# Patient Record
Sex: Male | Born: 1960
Health system: Southern US, Community
[De-identification: ages and names within clinical notes are randomized; demographics above are authoritative.]

## PROBLEM LIST (undated history)

## (undated) DIAGNOSIS — G4730 Sleep apnea, unspecified: Secondary | ICD-10-CM

## (undated) DIAGNOSIS — F419 Anxiety disorder, unspecified: Secondary | ICD-10-CM

## (undated) DIAGNOSIS — M1990 Unspecified osteoarthritis, unspecified site: Secondary | ICD-10-CM

## (undated) DIAGNOSIS — N529 Male erectile dysfunction, unspecified: Secondary | ICD-10-CM

## (undated) DIAGNOSIS — R011 Cardiac murmur, unspecified: Secondary | ICD-10-CM

## (undated) DIAGNOSIS — I10 Essential (primary) hypertension: Secondary | ICD-10-CM

## (undated) DIAGNOSIS — E119 Type 2 diabetes mellitus without complications: Secondary | ICD-10-CM

## (undated) DIAGNOSIS — E785 Hyperlipidemia, unspecified: Secondary | ICD-10-CM

## (undated) DIAGNOSIS — F32A Depression, unspecified: Secondary | ICD-10-CM

## (undated) HISTORY — DX: Essential (primary) hypertension: I10

## (undated) HISTORY — PX: HAND SURGERY: SHX662

## (undated) HISTORY — DX: Hyperlipidemia, unspecified: E78.5

## (undated) HISTORY — DX: Anxiety disorder, unspecified: F41.9

## (undated) HISTORY — DX: Male erectile dysfunction, unspecified: N52.9

## (undated) HISTORY — DX: Depression, unspecified: F32.A

## (undated) HISTORY — PX: COLONOSCOPY: SHX5424

## (undated) HISTORY — PX: BACK SURGERY: SHX140

---

## 1998-11-26 ENCOUNTER — Ambulatory Visit (HOSPITAL_COMMUNITY): Admission: RE | Admit: 1998-11-26 | Discharge: 1998-11-26 | Payer: Self-pay | Admitting: Specialist

## 1998-11-26 ENCOUNTER — Encounter: Payer: Self-pay | Admitting: Specialist

## 1998-12-10 ENCOUNTER — Ambulatory Visit (HOSPITAL_COMMUNITY): Admission: RE | Admit: 1998-12-10 | Discharge: 1998-12-10 | Payer: Self-pay | Admitting: Specialist

## 1998-12-10 ENCOUNTER — Encounter: Payer: Self-pay | Admitting: Specialist

## 1999-01-27 ENCOUNTER — Encounter: Payer: Self-pay | Admitting: Specialist

## 1999-02-02 ENCOUNTER — Inpatient Hospital Stay (HOSPITAL_COMMUNITY): Admission: RE | Admit: 1999-02-02 | Discharge: 1999-02-04 | Payer: Self-pay | Admitting: Specialist

## 1999-02-02 ENCOUNTER — Encounter: Payer: Self-pay | Admitting: Specialist

## 1999-02-02 ENCOUNTER — Encounter (INDEPENDENT_AMBULATORY_CARE_PROVIDER_SITE_OTHER): Payer: Self-pay

## 1999-05-04 ENCOUNTER — Ambulatory Visit (HOSPITAL_COMMUNITY): Admission: RE | Admit: 1999-05-04 | Discharge: 1999-05-04 | Payer: Self-pay | Admitting: Specialist

## 1999-05-04 ENCOUNTER — Encounter: Payer: Self-pay | Admitting: Specialist

## 1999-05-19 ENCOUNTER — Ambulatory Visit (HOSPITAL_COMMUNITY): Admission: RE | Admit: 1999-05-19 | Discharge: 1999-05-19 | Payer: Self-pay | Admitting: Specialist

## 1999-05-19 ENCOUNTER — Encounter: Payer: Self-pay | Admitting: Specialist

## 1999-06-04 ENCOUNTER — Encounter: Payer: Self-pay | Admitting: Specialist

## 1999-06-04 ENCOUNTER — Ambulatory Visit (HOSPITAL_COMMUNITY): Admission: RE | Admit: 1999-06-04 | Discharge: 1999-06-04 | Payer: Self-pay | Admitting: Specialist

## 1999-07-13 ENCOUNTER — Encounter: Payer: Self-pay | Admitting: Specialist

## 1999-07-13 ENCOUNTER — Ambulatory Visit (HOSPITAL_COMMUNITY): Admission: RE | Admit: 1999-07-13 | Discharge: 1999-07-13 | Payer: Self-pay | Admitting: Specialist

## 1999-08-12 ENCOUNTER — Observation Stay (HOSPITAL_COMMUNITY): Admission: RE | Admit: 1999-08-12 | Discharge: 1999-08-13 | Payer: Self-pay | Admitting: Specialist

## 1999-08-12 ENCOUNTER — Encounter: Payer: Self-pay | Admitting: Specialist

## 1999-12-28 ENCOUNTER — Encounter: Payer: Self-pay | Admitting: Specialist

## 1999-12-28 ENCOUNTER — Ambulatory Visit (HOSPITAL_COMMUNITY): Admission: RE | Admit: 1999-12-28 | Discharge: 1999-12-28 | Payer: Self-pay | Admitting: Specialist

## 2000-02-22 ENCOUNTER — Encounter: Payer: Self-pay | Admitting: Specialist

## 2000-02-22 ENCOUNTER — Ambulatory Visit (HOSPITAL_COMMUNITY): Admission: RE | Admit: 2000-02-22 | Discharge: 2000-02-22 | Payer: Self-pay | Admitting: Specialist

## 2000-07-31 ENCOUNTER — Encounter: Payer: Self-pay | Admitting: Specialist

## 2000-08-07 ENCOUNTER — Encounter (INDEPENDENT_AMBULATORY_CARE_PROVIDER_SITE_OTHER): Payer: Self-pay

## 2000-08-07 ENCOUNTER — Inpatient Hospital Stay (HOSPITAL_COMMUNITY): Admission: RE | Admit: 2000-08-07 | Discharge: 2000-08-10 | Payer: Self-pay | Admitting: Specialist

## 2000-08-07 ENCOUNTER — Encounter: Payer: Self-pay | Admitting: Specialist

## 2001-02-28 ENCOUNTER — Encounter: Payer: Self-pay | Admitting: Specialist

## 2001-02-28 ENCOUNTER — Encounter: Admission: RE | Admit: 2001-02-28 | Discharge: 2001-02-28 | Payer: Self-pay | Admitting: Specialist

## 2001-04-11 DIAGNOSIS — I219 Acute myocardial infarction, unspecified: Secondary | ICD-10-CM

## 2001-04-11 HISTORY — DX: Acute myocardial infarction, unspecified: I21.9

## 2001-07-10 HISTORY — PX: LAMINECTOMY: SHX219

## 2002-10-01 ENCOUNTER — Encounter: Payer: Self-pay | Admitting: Specialist

## 2002-10-01 ENCOUNTER — Encounter: Admission: RE | Admit: 2002-10-01 | Discharge: 2002-10-01 | Payer: Self-pay | Admitting: Specialist

## 2002-10-01 ENCOUNTER — Encounter: Payer: Self-pay | Admitting: Radiology

## 2002-10-23 ENCOUNTER — Encounter: Payer: Self-pay | Admitting: Specialist

## 2002-10-23 ENCOUNTER — Encounter: Admission: RE | Admit: 2002-10-23 | Discharge: 2002-10-23 | Payer: Self-pay | Admitting: Specialist

## 2003-01-06 ENCOUNTER — Encounter: Payer: Self-pay | Admitting: Specialist

## 2003-01-06 ENCOUNTER — Encounter: Admission: RE | Admit: 2003-01-06 | Discharge: 2003-01-06 | Payer: Self-pay | Admitting: Specialist

## 2003-02-12 ENCOUNTER — Encounter: Admission: RE | Admit: 2003-02-12 | Discharge: 2003-02-12 | Payer: Self-pay | Admitting: Specialist

## 2003-02-19 ENCOUNTER — Emergency Department (HOSPITAL_COMMUNITY): Admission: EM | Admit: 2003-02-19 | Discharge: 2003-02-19 | Payer: Self-pay

## 2003-02-27 ENCOUNTER — Encounter: Admission: RE | Admit: 2003-02-27 | Discharge: 2003-02-27 | Payer: Self-pay | Admitting: Specialist

## 2003-05-07 ENCOUNTER — Encounter: Admission: RE | Admit: 2003-05-07 | Discharge: 2003-05-07 | Payer: Self-pay | Admitting: Specialist

## 2003-05-21 ENCOUNTER — Encounter: Admission: RE | Admit: 2003-05-21 | Discharge: 2003-05-21 | Payer: Self-pay | Admitting: Specialist

## 2003-06-11 ENCOUNTER — Encounter: Admission: RE | Admit: 2003-06-11 | Discharge: 2003-06-11 | Payer: Self-pay | Admitting: Specialist

## 2005-01-05 IMAGING — CT CT ABDOMEN W/ CM
1 of 4 series · 14 of 32 positions shown, 19 images · non-contrast
Comparison: none

CLINICAL DATA: Abdominal pain.

[Series 2: abd/pelvis 5.0 b10f · axial · 0.75mm/px · z∈[+1196,+1606]mm · 14 of 94 slices shown, 19 images]
[im 6/94  soft-tissue]
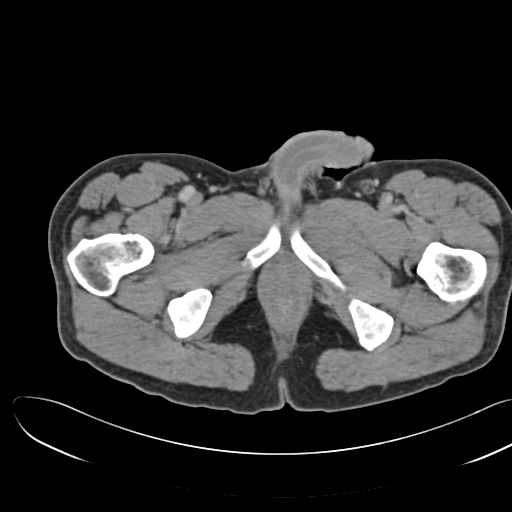
[im 6/94  bone]
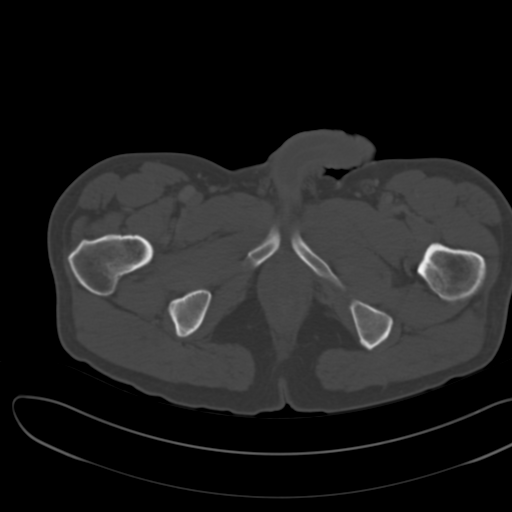
[im 11/94  soft-tissue]
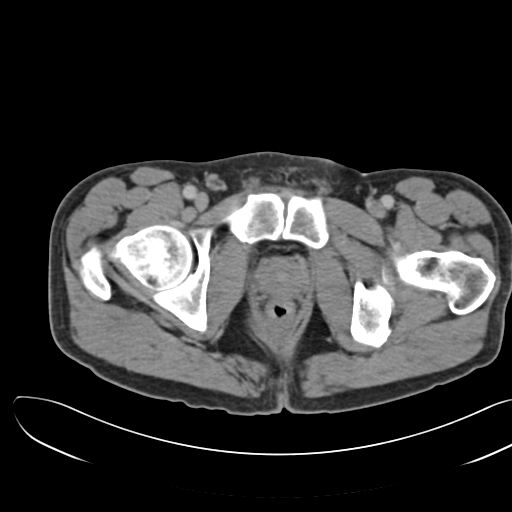
[im 21/94  soft-tissue]
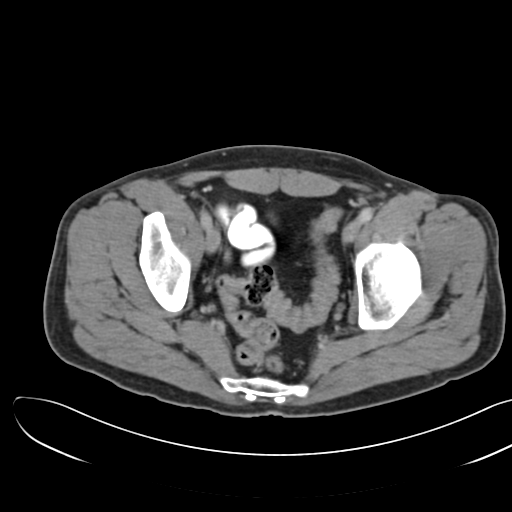
[im 26/94  soft-tissue]
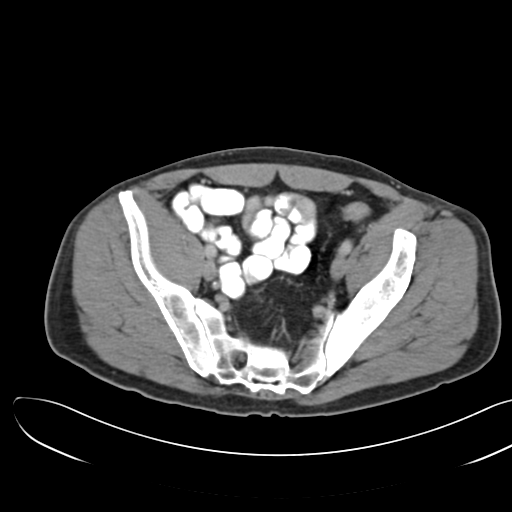
[im 32/94  soft-tissue]
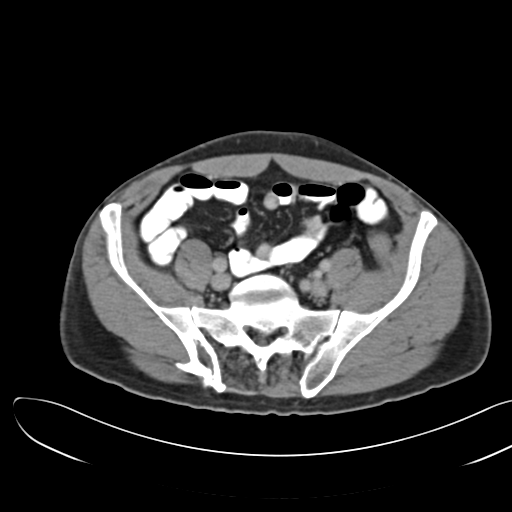
[im 42/94  soft-tissue]
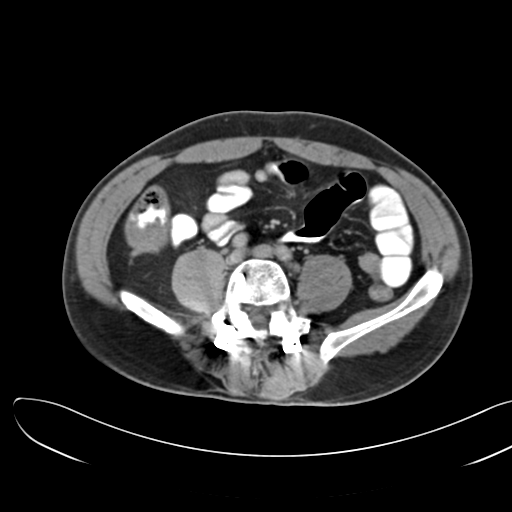
[im 47/94  soft-tissue]
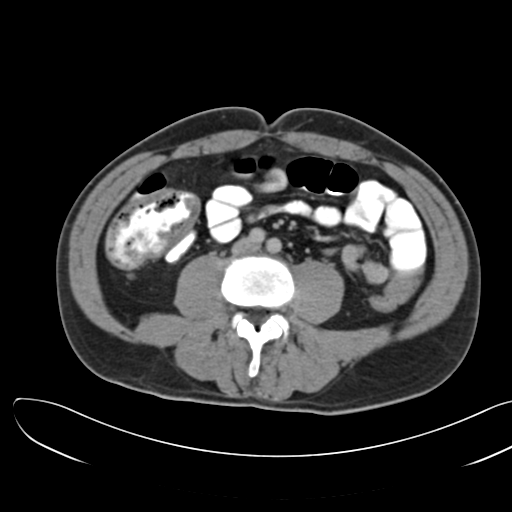
[im 52/94  soft-tissue]
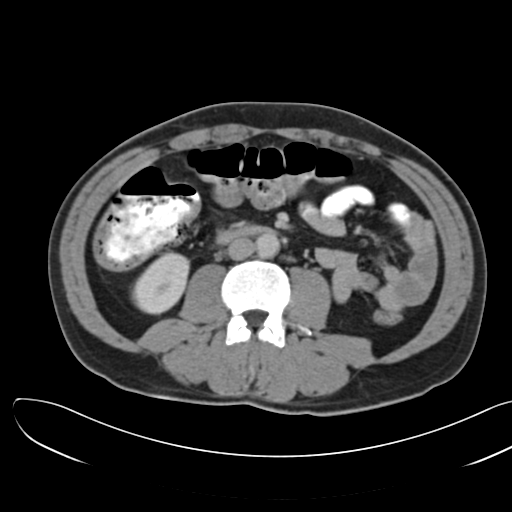
[im 63/94  soft-tissue]
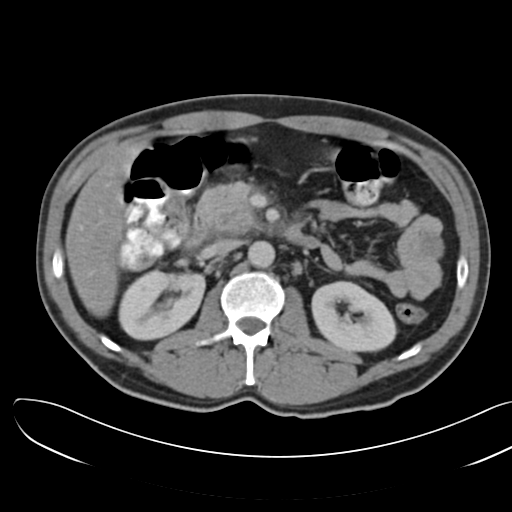
[im 63/94  bone]
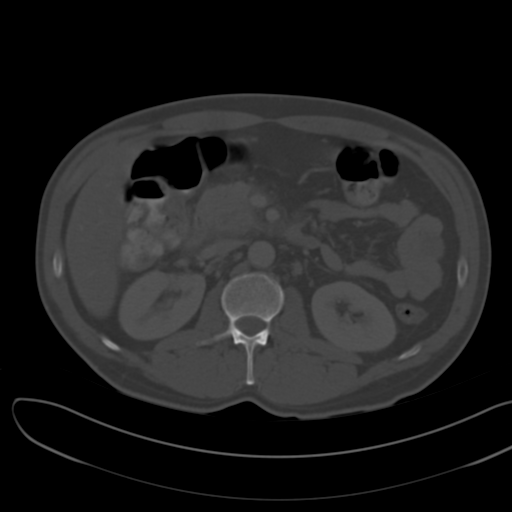
[im 68/94  soft-tissue]
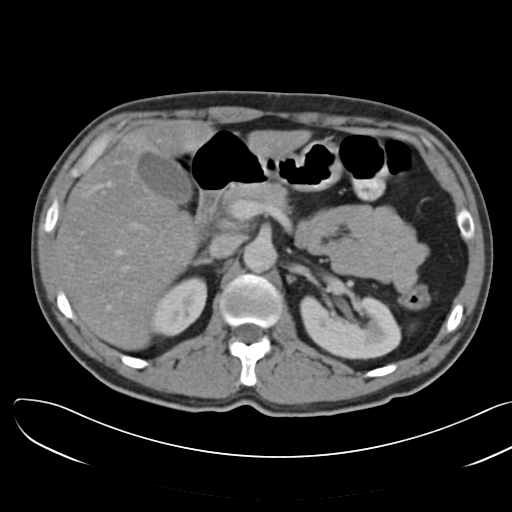
[im 73/94  soft-tissue]
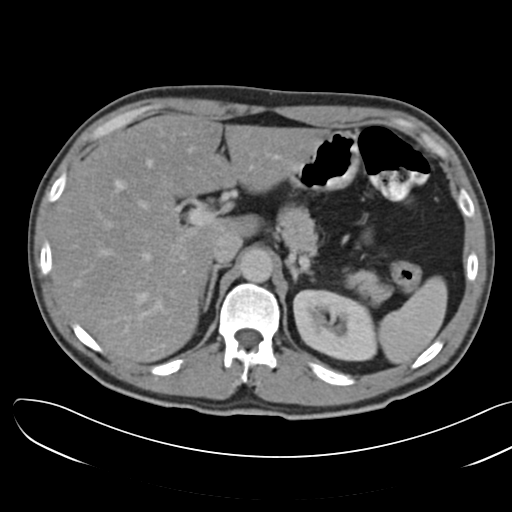
[im 73/94  lung]
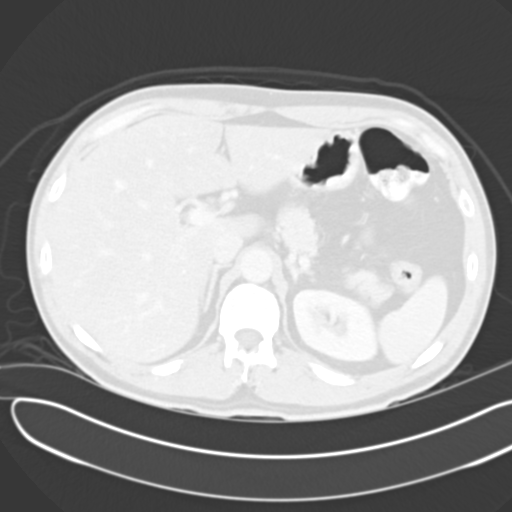
[im 78/94  lung]
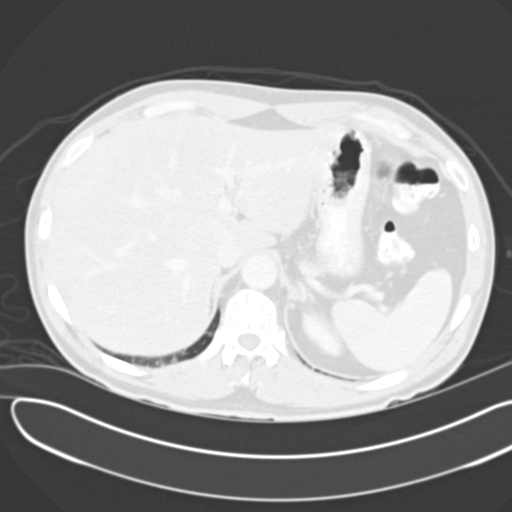
[im 83/94  soft-tissue]
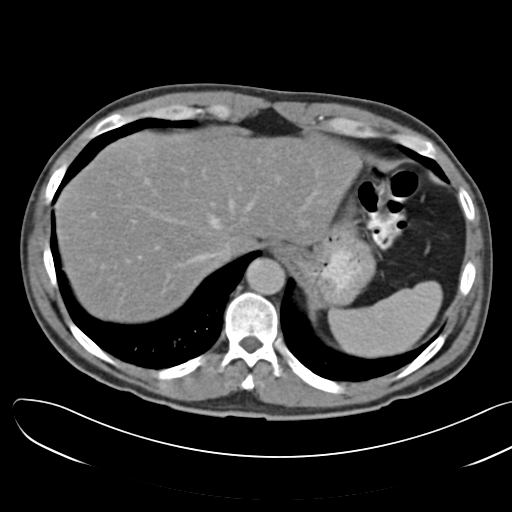
[im 83/94  lung]
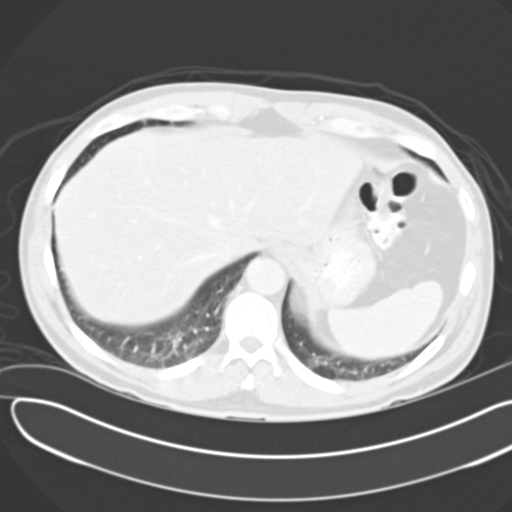
[im 88/94  soft-tissue]
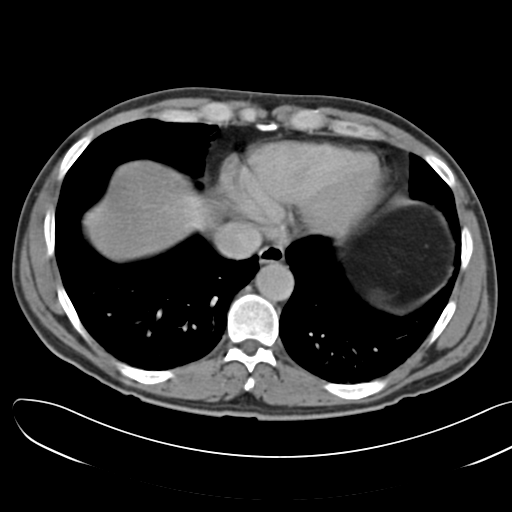
[im 88/94  lung]
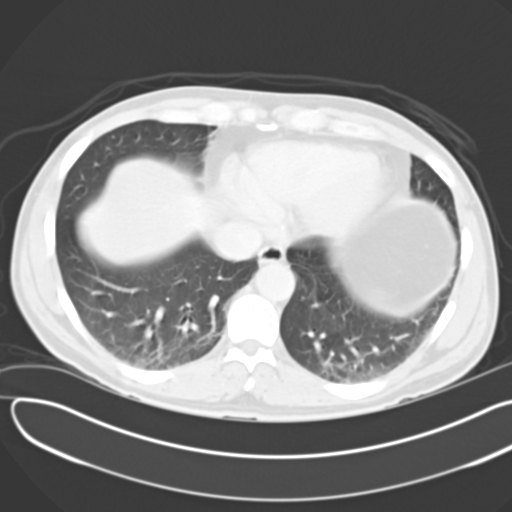

[14 of 32 positions shown; findings below may reference images not displayed]

CT SCAN OF THE ABDOMEN WITH CONTRAST
 Multiple spiral images were made through abdomen after intravenous injection of 100 cc of Omnipaque 300.  Oral contrast was also used. 

 The lung bases are normal.  There is fatty change in the liver.  There is enlargement of the pancreatic head with peripancreatic inflammatory change.  The body and tail of the pancreas are not enlarged or inflamed.  There are no calcified gallstones.  The kidneys are normal.

 IMPRESSION
 1.  Enlargement of the pancreatic head with inflammatory change compatible with early pancreatitis with no pseudocyst.  
 2.  Fatty change in the liver.

 CT SCAN OF THE PELVIS WITH CONTRAST
 Additional images through the pelvis after oral and IV contrast demonstrate no mass, adenopathy, or free fluid.

 IMPRESSION
 Negative CT scan of the pelvis with contrast.

## 2005-01-26 ENCOUNTER — Emergency Department (HOSPITAL_COMMUNITY): Admission: EM | Admit: 2005-01-26 | Discharge: 2005-01-27 | Payer: Self-pay | Admitting: Emergency Medicine

## 2005-02-08 ENCOUNTER — Ambulatory Visit: Payer: Self-pay | Admitting: Gastroenterology

## 2005-02-12 ENCOUNTER — Ambulatory Visit (HOSPITAL_COMMUNITY): Admission: RE | Admit: 2005-02-12 | Discharge: 2005-02-12 | Payer: Self-pay | Admitting: Gastroenterology

## 2006-12-12 ENCOUNTER — Ambulatory Visit: Payer: Self-pay | Admitting: Internal Medicine

## 2006-12-14 ENCOUNTER — Ambulatory Visit (HOSPITAL_COMMUNITY): Admission: RE | Admit: 2006-12-14 | Discharge: 2006-12-14 | Payer: Self-pay | Admitting: Internal Medicine

## 2007-01-08 ENCOUNTER — Ambulatory Visit: Payer: Self-pay | Admitting: Internal Medicine

## 2007-01-15 ENCOUNTER — Ambulatory Visit: Payer: Self-pay | Admitting: Internal Medicine

## 2007-01-31 DIAGNOSIS — J984 Other disorders of lung: Secondary | ICD-10-CM | POA: Insufficient documentation

## 2007-01-31 DIAGNOSIS — R059 Cough, unspecified: Secondary | ICD-10-CM | POA: Insufficient documentation

## 2007-01-31 DIAGNOSIS — R05 Cough: Secondary | ICD-10-CM

## 2007-01-31 HISTORY — DX: Other disorders of lung: J98.4

## 2008-09-02 ENCOUNTER — Ambulatory Visit: Payer: Self-pay | Admitting: Internal Medicine

## 2008-09-02 DIAGNOSIS — R0602 Shortness of breath: Secondary | ICD-10-CM | POA: Insufficient documentation

## 2008-09-02 DIAGNOSIS — F17200 Nicotine dependence, unspecified, uncomplicated: Secondary | ICD-10-CM

## 2008-09-03 ENCOUNTER — Ambulatory Visit: Payer: Self-pay | Admitting: Internal Medicine

## 2008-10-21 ENCOUNTER — Ambulatory Visit: Payer: Self-pay | Admitting: Internal Medicine

## 2008-10-21 ENCOUNTER — Encounter: Payer: Self-pay | Admitting: Internal Medicine

## 2008-10-21 DIAGNOSIS — R079 Chest pain, unspecified: Secondary | ICD-10-CM | POA: Insufficient documentation

## 2008-10-31 ENCOUNTER — Encounter: Payer: Self-pay | Admitting: Internal Medicine

## 2008-10-31 ENCOUNTER — Ambulatory Visit (HOSPITAL_COMMUNITY): Admission: RE | Admit: 2008-10-31 | Discharge: 2008-10-31 | Payer: Self-pay | Admitting: Internal Medicine

## 2008-11-12 ENCOUNTER — Ambulatory Visit: Payer: Self-pay | Admitting: Internal Medicine

## 2008-11-13 ENCOUNTER — Telehealth: Payer: Self-pay | Admitting: Internal Medicine

## 2008-11-21 ENCOUNTER — Ambulatory Visit: Payer: Self-pay | Admitting: Internal Medicine

## 2008-11-21 DIAGNOSIS — J449 Chronic obstructive pulmonary disease, unspecified: Secondary | ICD-10-CM | POA: Insufficient documentation

## 2010-05-01 ENCOUNTER — Encounter: Payer: Self-pay | Admitting: Specialist

## 2010-08-24 NOTE — Letter (Signed)
January 15, 2007    Dr. Selinda [name]  [address] [place], [place]  [place], [place]  [address]   RE:  Miguel, [name]  MRN:  [id number]  /  DOB:  07-19-1960   Dear Dr. Dimas [name]:   I saw [name] again in pulmonary clinic followup for right  atypical chest pain.  He met with an orthopedic physician a month ago  and got epidural injection of possibly steroids, but he says after this  his chest pain is completely resolved.  He has really no new complaints.   PAST MEDICAL HISTORY:  No change from December 12, 2006.  He still  continues to smoke.   ALLERGIES:  No known drug allergies.   MEDICATIONS:  No changes to medications since December 12, 2006.   SOCIAL HISTORY:  No change since December 12, 2006.  He is actually  accompanied by his wife and child today.   FAMILY HISTORY:  No change since December 12, 2006.   REVIEW OF SYSTEMS:  No change since December 12, 2006.   PHYSICAL EXAMINATION:  No change since December 12, 2006.  VITAL SIGNS:  Weight 192.8, temperature 98.9, blood pressure 110/80,  pulse 65, saturation 98% on room air.  GENERAL:  A pleasant male seated comfortably.  CENTRAL NERVOUS SYSTEM:  Alert and oriented x3.  Moves all four  extremities.  Speech and ambulation are normal.  CARDIOVASCULAR:  Normal heart sounds, regular rate and rhythm.  RESPIRATORY:  Air entry is normal on both sides.  CHEST:  No focal tenderness.  HEENT:  Mallampati class I.  NECK:  No neck nodes, elevated JVP.  ABDOMEN:  Soft, obese, nontender, normal bowel sounds.  EXTREMITIES:  No pedal edema, no clubbing, no cyanosis.  SKIN:  Intact.  MUSCULOSKELETAL:  No back or spine deformities.   LABORATORY DATA:  A full set of PFTs today essentially normal.  DLCO  uncorrected for hemoglobin was 22.2 mL/mmHg per minute 79% predicted,  but it is borderline.  V/Q scan:  This was reported as low probability  or normal.   ASSESSMENT/PLAN:  1. Right lateral chest pain.  This is resolved with epidural  injection      of what sounds like corticosteroids.  Therefore this pain was most      likely related to degenerative joint disease.  This is no longer an      issue and will not follow this up at any time.   1. Cough.  He still has occasional mild cough and he smokes, but his      PFTs are normal.  We do not have an etiology for cough other than      smoking so he probably has mild chronic bronchitis that has not      shown up in the PFTs.  His DLCO was 79%.  This is borderline low so      it could well be he has isolated low DLCO with the coughs being      chronic obstructive pulmonary disease.  On the other hand this      could just be a laboratory variation.  This is normal.  This can be      followed up expectantly.   1. Nodules and atelectasis.  His next CT scan will be done in June      2009.   1. Smoking.  Last visit he wanted to quit.  This visit I approached      the subject with him,  but at first he said he wanted to quit but on      his own.  At that time his wife said that he had no willpower.  He      then admitted that he basically has a lot of free time and      therefore it is difficult for him to quit. Later towards the end of      the evaluation he said that he wanted Wellbutrin to quit.      Therefore I wrote a prescription for Wellbutrin.  I cautioned him      about the side effects.  Told him to start working his cigarettes      down over two weeks to a quit date, and during this time Wellbutrin      needs to be on board for at least two weeks.  Total duration of      therapy would be three months.  He will see me at four weeks to      report      progress and also to have more discussions about quitting smoking.      Currently there are, otherwise, no issues.   Return to clinic in four weeks for quit smoking progress.    Sincerely,      Miguel Shan, MD  Electronically Signed    MR/MedQ  DD: 01/15/2007  DT: 01/16/2007  Job #: [id number]

## 2010-08-24 NOTE — Letter (Signed)
December 12, 2006    Miguel [name]  [address] [place], [place]  [place] [address]   RE:  Miguel [name]  MRN:  [id number]  /  DOB:  Jul 26, 1960   Dear Dr. Dimas Aguas,   It was a pleasure to see [name] in pulmonary clinic today.  Thank you for the interesting referral.  As you know, he is a pleasant  50 year old gentleman with pain in the right side of the chest for the  last 3-4 months.  He said he was diagnosed with pneumonia in June 2008  and was given antibiotics. However, on followup with another physician  he says his diagnosis was changed to pleurisy. He then underwent a CT  scan of the chest on October 10, 2006 (see results below).  A month later  which was somewhere in August 2008, he was seen by a different doctor  who said that he had no pleurisy.  He is worried about changing  diagnosis.   He says his pain is constant and rates it as a 4-5/10. Localized to  right infra axillary region laterally.  He says that it is worse when he  lies down on his left side but is better when he lies on his right side.  It also gets worse with sneezing, but otherwise there are no  characteristics for the pain.  He denies any shingles, and there is no  relation to coughing, movement, or any other features.  He denies any  fevers, shortness of breath, hemoptysis, pedal edema, trauma, deep  venous thrombosis, pulmonary embolism, or loss of consciousness.   PAST MEDICAL HISTORY:  1. Recurrent annual admissions for epigastric/chest pain in local      emergency rooms for the past 5-6 years.  He says that he has      occasionally even been admitted to the cardiac telemetry unit and      is ruled out for myocardial infarction.  However, he has never had      a cardiac catheterization or a stress test.   1. Chronic L4-L5 back pain.  He always has permanent sciatica.  He has      surgical history to his back.  He had his first diskectomy in 1988.      This was followed by a ruptured disk  again and had neck surgery in      2000 and a third surgery again in 2005 when he said a cage was      placed.  Since then, he has been disabled and always lives with      sciatica.   1. Smoking.  He is a chronic heavy smoker.  He smoked 2 packs a day      for the last 35 years.  He has some amount of smoker's cough.  He      has tried Chantix and Wellbutrin in the past.  He agrees that      smoking is bad for his health and wants to quit, but he said he is      weak.   ALLERGIES:  No known drug allergies.   CURRENT MEDICATIONS:  1. Neurontin 600 mg t.i.d.  2. Zocor 40 mg once daily.  3. Omega 3 fatty acid once daily.  4. Zoloft 50 mg once daily.  5. Percocet and oxycodone since 2004 for back.  6. He also gets epidural injections periodically for his back.  7. He takes Viagra p.r.n.   SOCIAL HISTORY:  He is currently disabled.  He used to work in  [employer].  He smokes 2 packs a day x35 years.  He still smokes.  He  lives with his wife and has not traveled anywhere recently.  He also has  children whom he lives with.   FAMILY HISTORY:  He thinks his dad might have COPD, and his brother had  some kind of a stomach cancer at birth, but he is still alive at age 64.   REVIEW OF SYSTEMS:  1. Positive for smoker's cough.  He states this is not bad.  He has      had it for 4-5 years.  It is a dry cough and is just like he clears      his throat.  2. Chronic back pain with sciatica.  The rest of the review of systems      was negative.   PHYSICAL EXAM:  VITAL SIGNS:  Weight 194.2, temperature 98.6, blood  pressure 118/82, pulse of 57, oxygen saturation 98% on room air.  GENERAL EXAM:  A pleasant male seated comfortably in the exam room.  CENTRAL NERVOUS SYSTEM:  Alert and oriented x3.  Moves all 4 extremities  normally.  Speech is normal.  Ambulation normal.  CARDIOVASCULAR:  Normal heart sounds.  Regular rate and rhythm.  No  gallop, no murmurs.  RESPIRATORY:  Air entry equal  on both sides, normal breath sounds.  CHEST EXAM:  No focal tenderness including at the site of pain.  HEENT:  Mallampati class 1.  No neck nodes, no elevated JVP.  ABDOMEN:  Soft, obese, nontender, no organomegaly, normal bowel sounds.  EXTREMITIES:  No pedal edema, no clubbing, no cyanosis.  SKIN:  Intact.  MUSCULOSKELETAL:  No back or spine deformities.   LABORATORY VALUES:  A) I have reviewed the CT scan from October 10, 2006.  This was done at [facility] the [facility], and the report is as follows.  1. There is a 1 mm nodule abutting the pleura in the posterior segment      of the left upper lobe on slice 27.  I personally saw the scan.  I      could not confirm the presence of this nodule.  2. There is a small area of consolidation immediately adjacent to the      epicardial fat on the right in the medial segment of the right      middle lobe.  I confirmed this finding.  Inferiorly a second      smaller area of infiltrate is noted in the medial segment of the      right middle lobe.  I confirmed this finding as well.  3. There is a minimal atelectasis in the anterior left base.  I      confirmed this finding as well.  4. On image study 6, the left upper lobe anteriorly between the heart      and the chest wall there is some amount of scarring or atelectasis.      This is not on their individual report.  There are several      paratracheal lymph nodes.  The largest of these is 1.3 cm.  No      other lymph node prominence is appreciated.    B)  MRA of the thoracic spine and  results of MRI of thoracic spine,  December 04, 2006.  I do not have this film, but it is reported it  is a  mild degenerative change T9 to T10 without any other abnormalities.   ASSESSMENT/PLAN:  In summary, Miguel [name] is a 50 year old male who is a  chronic heavy smoker, has chronic cough, has 3 month history of constant  atypical chest pain of unclear etiology.  CT scan shows  some right  middle lobe atelectasis along the epicardial fat, but his pain is more  lateral in the inframaxillary region.  There are no CT abnormalities in  this region.   1. Chest pain.  I do not know the etiology.  One very possibility is      pulmonary embolism.  In order to be complete, I will get a VQ scan;      however, I doubt this is the problem.   1. Smoking.  He wants to quit.  He has agreed to quit.  He will try      Wellbutrin again.  He prefers this over Chantix.  We will discuss      more of this at the next visit.   1. Cough.  This sounds more like he has chronic bronchitis and chronic      obstructive pulmonary disease.  We will repeat full set of PFTs,      and I will evaluate for chronic obstructive pulmonary disease at      next visit.   1. Nodules and atelectasis.  Currently these are micro-nodules, and      the one reported nodule in the left upper lobe is only 1 mm.  I      never saw this myself.  Based on Fleischner criteria, because all      these nodules are less than 4-5 mm, a repeat scan is required only      in 1 year;      however, given the level of concern in this gentleman, I will get      another scan in 9 months from the original scan.  I might also      consider a PET scan for greater than 1 cm paratracheal node if the      CT scan is negative.  I will also consider a PET scan and a bone      scan if the VQ scans are negative.    Sincerely,      Kalman Shan, MD  Electronically Signed    MR/MedQ  DD: 12/12/2006  DT: 12/12/2006  Job #: [id number]

## 2010-08-27 NOTE — Op Note (Signed)
[facility]  Patient:    Miguel [name], Miguel [name]                     MRN: [id number] Proc. Date: 08/07/00 Adm. Date:  [id number] Attending:  Pierce Crane                           Operative Report  PREOPERATIVE DIAGNOSES:  Recurrent herniated nucleus pulposus L4-5, degenerative disk disease L4-5.  POSTOPERATIVE DIAGNOSES:  Recurrent herniated nucleus pulposus L4-5, degenerative disk disease L4-5.  OPERATION PERFORMED:  Medial microdiskectomy L4-5 posterior lumbar interbody fusion with radiolucent cage devices utilizing right iliac crest bone graft autogenous, posterior lateral lumbar fusion L4-5 utilizing pedicle screw instrumentation and rods, a Depuy system.  ANESTHESIA:  General.  BRIEF HISTORY AND INDICATIONS:  A 50 year old with multiple recurrent disk herniations. Operation intervention was indicated for diskectomy and fusion to prevent recurrence. The patient had a diskography at 3-4 and at 5-1 that was discordant. The risks and benefits were discussed including bleeding, infection, injury to neurovascular structures, pseudoarthrosis, need for fusion at adjacent levels, etc.  TECHNIQUE:  The patient in the supine position after an adequate level of general anesthesia and 1 gm of Kefzol, the patient placed prone on the Andrews frame and all bony prominences were well padded. The lumbar region was prepped and draped in the usual sterile fashion. A preexisting surgical scar was excised and dissection was extended cephalad and caudad. The subcutaneous tissue was dissected, electrocautery utilized to achieve hemostasis. The dorsolumbar fascia was identified and divided in line with the skin incision from the L3 spinous process to S1. The paraspinous muscle was elevated from the lamina of 3-4, 5 and S1 bilaterally taking care not to enter the interlaminar space on the left since he has had a previous laminectomy on the left at L4. Next a  McCullough retractor was placed. The 4-5 space was confirmed with x-ray. A curette was utilized to attach epidural fibrosis from the lamina of 4, spinous processes of 4 and 5 were partially removed. A 2 mm Kerrison was used to enlarge the laminotomy cephalad and caudad. On the right where the disk herniation was noted, a DErrico was utilized to gently mobilized the thecal sac medially. The nerve root of 5 and 4 were identified and well protected. A large HNP paracentral to the right was noted and extruded. Annulotomy was performed and a copious portion of disk material was removed from the disk space. Following this thorough diskectomy, there was no residual compression found in the thecal sac and the nerve root. Next the Monark system radiolucent carbon fiber interbody cages were then inserted after the appropriate utilization of the surgeons instrumentation involving spreader paddles, endplates curettes, cylindrical reamers followed by boxed broaches. The optimal sizing was to a 9 x 11. The spreader was then inserted onto the right and then the disk space was entered on the left at the appropriate mobilization of the neural elements, annulotomy and diskectomy. In a similar fashion, the left interspace was prepared after the appropriate endplate curetting broaching to prepare the disk space for insertion of the radiolucent cages. After preparation, the iliac crest was harvested from the right iliac crest. A separate incision made through the dorsolumbar fascia. The posterior cortex was removed with a curette between the tables. The iliac creatinine bone graft was harvested, excellent cancellous harvest was obtained between the tables of the crest. The wound then copiously irrigated,  bone wax placed in the wound between the tables followed by thrombin soaked Gelfoam and the fascia reapproximated with #1 Vicryl interrupted figure-of-eight sutures. It was dry, it was dried prior to its  closure. Cancellous bone graft was morcellized, impacted into the cages with neural elements well protected under C-arm x-ray. The cages were then inserted into the disk space with excellent purchase noted. The depth of insertion was measured and a 25 mm depth cage was utilized. This was at all times examined under x-ray with radiolucent markers on the cages for visualization. Following insertion of the interbody cages, the pedicle screws were then inserted with the appropriate identification at the insertion site. The intersections between the lateral aspect of the superior portion of the facet and the transverse process first by an all and then by a pedicle finder. This was again done by x-ray with appropriate convergence at all times in x-ray. This was visualized. Each was measured to a 40 tapped and insertion of the pedicle screw 625 was performed at each pedicle both at 4 and 5 with excellent purchase noted. Prior to actual insertion of the pedicle screws, the high-speed bur was utilized to decorticate the transverse processes, the pars, the lateral aspect of the facet. The remaining facet was then debrided and bone graft placed within the facet as well. Bone graft was placed in the lateral recess. Next the pedicle screws were then inserted. A precontoured rod was then placed between the pedicle screws. The dovetail cap was then in place with a derotation device. This was performed with the four pedicle screws. Facet screws were then tightened and torqued in the L5 assembly. The compressor was utilized to compress the rod and then the facet screws were tightened to 80 pounds torque on the L4 assembly. Excellent purchase was obtained, construct was checked and manipulated without evidence of disassociation. Bone graft was placed at both sides after decortication and prior to the placement of the pedicle screws and of the rods. This was done under x-ray and on the AP and lateral planes  was found to be satisfactory. The wound was copiously irrigated, electrocautery was utilized to achieve hemostasis. The canal was checked and the hockey-stick probe placed at the foramen of 4 and 5 bilaterally and found to be widely patent. The wound was  copiously irrigated. Thrombin-soaked Gelfoam was placed in the laminotomy defects. Both cages were found to be countersunk. The dura was found to be pulsatile and without evidence of leakage. Next a Hemovac was placed and brought out through a stab wound in the dorsolumbar fascia. The paraspinous musculature was reapproximated with #0 Vicryl simple sutures. The dorsolumbar fascia was reapproximated with #1 Vicryl interrupted figure-of-eight sutures. Excellent water-tight closure was noted. The subcutaneous tissue was reapproximated with 2-0 Vicryl simple sutures. The skin was reapproximated with staples. The Hemovac was charged. The dressing was secured with tape. The patient was placed supine on the hospital bed, extubated without difficulty and transported to the recovery room in satisfactory condition.  The patient tolerated the procedure well with no complications. Blood loss was 600 cc. He got 250 cc back in [facility] [employer]. DD:  08/07/00 TD:  08/08/00 Job: [id number]

## 2010-08-27 NOTE — Discharge Summary (Signed)
[facility]  Patient:    Miguel [name], Miguel [name]                       MRN: [id number] Adm. Date:  08/07/00 Disc. Date: 08/10/00 Attending:  Javier Docker, M.D. Dictator:   Colleen P. Mahar, P.A. CC:         Dr. Carlynn Spry, [place]   Discharge Summary  ADMISSION DIAGNOSES: 1. Recurrent herniated nucleus pulposus L4-L5. 2. Gastroesophageal reflux disease.  DISCHARGE DIAGNOSES: 1. Redo L4-L5 diskectomy, iliac crest bone graft right side, posterior    lateral interbody fusion with iliac crest bone graft and pedicle screws. 2. Postoperative hemorrhagic anemia, mild, asymptomatic, not requiring    transfusion. 3. Gastroesophageal reflux disease.  PROCEDURES:  Redo diskectomy L4-L5 with posterior lateral interbody fusion using an iliac crest bone graft on the right side and pedicle screws.  This was done by Dr. Jene Every with the assistance of Dr. Otelia Sergeant under general anesthesia.  CONSULTS:  None.  BRIEF ADMISSION HISTORY:  This is a 50 year old white male who has been seen by our practice for continuing problems concerning his low back with radiation into the right lower extremity.  The patient has had several back surgeries, one in the distant past and then one more recently in 2000.  He had a bilateral hemilaminectomy at L3-L4 and L4-L5 with a redo diskectomy at L4-L5 on the right.  Unfortunately, the patient has continued with pain and discomfort at L4-L5 nerve root distribution, and an MRI showed a large recurrent disk herniation at L4-L5.  This was an increase from the MRI of July 2001.  After much discussion and the fact that this patient has had several level surgeries as well as the fact that the patient has significant interference with is day-to-day activities, it is felt that he would benefit from surgical intervention and is being admitted for the above-stated procedure.  LABORATORY AND X-RAY DATA:  His preadmission labs from July 31, 2000:   H&H is 14.6 and 43.4.  On August 08, 2000, these decreased to 12.0 and 35.3 for H&H. His WBC on April 30 was 14.8 and RBC 3.96.  On Aug 09, 2000, WBC was 14.8, RBC 4.06, hemoglobin and hematocrit 12.3 and 35.9.  On Aug 10, 2000, H&H was 12.3 and 36.1 respectively.  Routine chemistries preadmission were within normal limits with the exception of glucose of 126.  On August 08, 2000, all was normal.  Urinalysis:  Preadmission urinalysis was negative.  EKG done on July 31, 2000, revealed sinus bradycardia, left atrial enlargement, cannot rule out anterior infarct, age undetermined, abnormal ECG. Additional cardiologist comments were possible inferior MI, age undetermined, no old tracing to compare.  This was confirmed by Dr. Lenise Herald.  X-rays done on July 31, 2000:  Chest x-ray revealed borderline heart size, no active disease.  On August 07, 2000, the lumbar spine (postoperative x-ray) revealed postoperative fusion at L4-L5.  HOSPITAL COURSE:  Postoperatively, PT and OT were both consulted for activities of daily living and home safety.  The patients pain was initially requiring high-dose PCA.  On Aug 09, 2000, the patient was weaned off the PCA, and pain was adequately controlled on p.o. analgesics.  On Aug 10, 2000, the patient had continued to progress with physical therapy and was felt to be a good candidate for discharge home.  He had met all expectations and requirements for home discharge.  On Aug 10, 2000, the patient was discharged to his  home status post microdiskectomy L4-L5 with instrumental fusion and iliac crest bone graft.  Dressing was removed on postop day #2, and the incision looked good, was without erythema or drainage.  Dressing changes were done q.d., and the incision continued to look good throughout his hospital stay.  DISCHARGE INSTRUCTIONS:  Activities:  Weightbearing as tolerated.  He is to walk using back precautions.  He was instructed on those and understood  those. He is to be in his brace when he is up out of bed.  He may shower and is to take the brace off only for showers.  Dressing changes q.d., and supplies were given.  Diet:  Resume his preop diet as tolerated.  DISCHARGE FOLLOWUP:  He is to follow up with Dr. Shelle Iron one week postop, and he is to call for an appointment.  MEDICATIONS ON DISCHARGE: 1. Percocet 5 mg one to two p.o. q.4-6h. p.r.n. pain, #40 with no refills. 2. Robaxin 500 mg one p.o. q.8h. p.r.n. spasm, #40 with no refills.  DISPOSITION:  He is being discharged to his home.  CONDITION ON DISCHARGE:  Stable and improved. DD:  08/15/00 TD:  08/16/00 Job: [id number]

## 2010-08-27 NOTE — H&P (Signed)
[facility]  Patient:    Miguel [name], Miguel [name]                       MRN: [id number] Adm. Date:  08/07/00 Attending:  Javier Docker, M.D. Dictator:   Druscilla Brownie. Shela Nevin, P.A.                         History and Physical  CHIEF COMPLAINT:  Pain in my back and right leg.  HISTORY OF PRESENT ILLNESS:  This 50 year old white male who has been seen by [place] for continuing problems concerning his low back with radiation to the right lower extremity.  The patient has had several back surgeries; one some time ago and more recent in 2000.  He had bilateral hemilaminectomy at L3/L4, L4/L5 with redo diskectomy at L4/L5 on the right was performed.  Unfortunately the patient has continued with pain and discomfort in the L3/L4 nerve root distribution.  An MRI showed a large recurrent disc herniation at L4/L5; this was an increase from his MRI of July 2001.  After much discussion and the fact that this patient has had several level surgeries, as well as the fact that the patient has significant interference with his day-to-day activities, it is felt that he would benefit from surgical intervention and is being admitted for redo diskectomy at L4/L5 with posterior lateral interbody fusion with allograft spaces, posterior lateral fusion with pedicle screws, local versus iliac crest bone graft, possible L3/L4 interbody fusion as well.  PAST MEDICAL HISTORY:  This gentleman has really been in good health throughout his life time.  MEDICATIONS: 1. Neurontin 600 mg 1 t.i.d. 2. Vioxx 50 mg 1 q.d. 3. Zoloft 100 mg 1 q.d. 4. Lorazepam 0.5 mg 1 q.d. 5. Percocet 5 1-2 q.4-6h. p.r.n. pain. 6. Viagra 100 mg p.r.n. 7. Nicoderm CQ 14 mg.  (He is trying to stop smoking.)  ALLERGIES:  No known drug allergies.  PAST SURGERIES:  Back surgeries in 1988, October 2000, and in May 2001 as well.  PAST MEDICAL HISTORY:  GERD.  FAMILY HISTORY:  The patient is married, smokes less than  1/2 pack of cigarettes per day, and no intake of ETOH.  Family history is noncontributory.  REVIEW OF SYSTEMS:  CNS:  The patient has neurogenic discopathy down the right lower extremity.  No seizure disorder, no paralysis.  CARDIOVASCULAR:  No chest pain, no angina, no orthopnea.  GASTROINTESTINAL:  No nausea or vomiting, melena, or bloody stool.  GENITOURINARY:  No discharge, dysuria, hematuria.  MUSCULOSKELETAL:  Primarily in present illness; his back and leg.  PHYSICAL EXAMINATION:  GENERAL:  Alert, cooperative, friendly, 50 year old white male who moves very carefully during the examination.  It takes him a while to begin walking after coming to a standing position from the examination table.  VITAL SIGNS:  Blood pressure 120/68, pulse 80, respirations 12.  HEENT:  Normocephalic.  PERRLA.  Oropharynx is clear.  LUNGS:  Clear to auscultation.  No rhonchi, no rales.  HEART:  Regular rate and rhythm.  No murmurs are heard.  ABDOMEN:  Soft, nontender.  Liver and spleen not felt.  Bowel sounds present.  GENITALIA/RECTAL:  Not done, not pertinent to present illness.  EXTREMITIES:  The patient has 5/5 muscle testing in the right lower extremity. He has positive straight leg raise in buttock, lateral thigh, and calf pain.  ADMITTING DIAGNOSES: 1. Recurrent herniated nucleus pulposus, L4/L5. 2. Gastroesophageal reflux disease.  PLAN:  The patient will undergo lumbar laminectomy for recurrent HNP at L4/L5, posterior lateral interbody fusion with allograft spaces, and posterior lateral fusion with pedicle screws, and possible L3/L4 interbody fusion with local versus iliac crest bone graft.  He has been fitted with a thoracolumbar body jacket. DD:  07/31/00 TD:  07/31/00 Job: [id number]

## 2010-08-27 NOTE — Op Note (Signed)
[facility]  Patient:    Miguel [name], Miguel [name]                     MRN: [id number] Proc. Date: 08/12/99 Adm. Date:  [id number] Disc. Date: [id number] Attending:  Pierce Crane                           Operative Report  PREOPERATIVE DIAGNOSIS:  Recurrent herniated nucleus pulposus L4-5 right.  POSTOPERATIVE DIAGNOSIS:  Recurrent herniated nucleus pulposus L4-5 right.  PROCEDURE:  Redo microdiskectomy L4-5 right.  ANESTHESIA:  General.  ASSISTANT:  Kerrin Champagne, M.D.  BRIEF HISTORY/INDICATIONS:  A 50 year old status post diskectomy with recurrent  symptoms confirmed with MRI and CT myelogram refractory to conservative treatment. Operative intervention was indicated for decompression of the 5 root, risks and  benefits discussed including bleeding, infection, injury to neurovascular structures, epidural fibrosis, recurrent disk herniation and need for fusion in the future, etc.  TECHNIQUE:  With the patient in the supine position after the induction of adequate general anesthesia with 1 gm of Kefzol IV for antimicrobial prophylaxis, the patient was placed prone on the  Andrews frame and all bony prominences well padded. The lumbar region was prepped and draped in the usual sterile fashion.  previous surgical incision was utilized and the surgical scar excised. The subcutaneous tissue was dissected, electrocautery utilized to achieve hemostasis. The dorsolumbar fascia was divided in line with the skin incision, paraspinous muscles carefully elevated from the lamina of 4 and 5. The McCullough retractor was placed, the operating microscope was draped and brought in the surgical field. Kochers were placed on the spinous processes of 4 and 5 confirmed with x-rays. Fibrosis was noted within the interspaces was mobilized and detached from the previous hemilaminotomy with a combination of curettes gently mobilizing the thecal sac. A Penfield 4 was  then utilized to gently mobilize the nerve root which was  adherent to the pedicle on the lateral recess. It was the enfolded ligamentum flavum that was removed with a 2 mm Kerrison. This was compressing the lateral recess. There was extensive epidural venous plexus, bipolar electrocautery was utilized to achieve hemostasis. A Penfield 4 was then utilized to mobilize the nerve root medially and the thecal sac off of the disk which was noted to be herniated. Annulotomy was then performed and copious portion of disk material was then removed from the interspace. There was no residual disk herniation noted.  hockey stick probe was placed in the foramen of 4 and 5 found to be widely patent. There was no residual disk material noted within the axilla, shoulder of the nerve root or beneath the thecal sac. This space was copiously irrigated and thrombin  soaked Gelfoam was placed in the laminotomy defect. This was to achieve hemostasis, subsequently removed. The wound again copiously elevated with no evidence of active bleeding or CSF leakage, 2 cc of fentanyl was placed in the epidural space. Next, the [facility] retractor was removed, the paraspinous muscle was inspected without evidence of active bleeding. The dorsolumbar fascia was reapproximated with a #1 Vicryl interrupted figure-of-eight suture. The subcutaneous tissue was reapproximated with 2-0 Vicryl simple suture. The skin was reapproximated with  3-0 subcuticular Prolene reinforced with Steri-Strips, sterile dressing applied. The patient was placed on the hospital bed, extubated without difficulty and transported to the recovery room in satisfactory condition.  The patient tolerated the procedure well with no complications. DD:  08/12/99  TD:  08/14/99 Job: [id number]

## 2011-06-23 DIAGNOSIS — M961 Postlaminectomy syndrome, not elsewhere classified: Secondary | ICD-10-CM | POA: Diagnosis not present

## 2011-06-23 DIAGNOSIS — IMO0002 Reserved for concepts with insufficient information to code with codable children: Secondary | ICD-10-CM | POA: Diagnosis not present

## 2011-06-23 DIAGNOSIS — M5030 Other cervical disc degeneration, unspecified cervical region: Secondary | ICD-10-CM | POA: Diagnosis not present

## 2011-07-01 DIAGNOSIS — M5030 Other cervical disc degeneration, unspecified cervical region: Secondary | ICD-10-CM | POA: Diagnosis not present

## 2011-07-01 DIAGNOSIS — M79609 Pain in unspecified limb: Secondary | ICD-10-CM | POA: Diagnosis not present

## 2011-07-01 DIAGNOSIS — Z9889 Other specified postprocedural states: Secondary | ICD-10-CM | POA: Diagnosis not present

## 2011-07-01 DIAGNOSIS — M545 Low back pain: Secondary | ICD-10-CM | POA: Diagnosis not present

## 2011-07-01 DIAGNOSIS — M961 Postlaminectomy syndrome, not elsewhere classified: Secondary | ICD-10-CM | POA: Diagnosis not present

## 2011-07-01 DIAGNOSIS — M47812 Spondylosis without myelopathy or radiculopathy, cervical region: Secondary | ICD-10-CM | POA: Diagnosis not present

## 2011-07-19 DIAGNOSIS — E7800 Pure hypercholesterolemia, unspecified: Secondary | ICD-10-CM | POA: Diagnosis not present

## 2011-07-19 DIAGNOSIS — N529 Male erectile dysfunction, unspecified: Secondary | ICD-10-CM | POA: Diagnosis not present

## 2011-07-19 DIAGNOSIS — R911 Solitary pulmonary nodule: Secondary | ICD-10-CM | POA: Diagnosis not present

## 2011-07-19 DIAGNOSIS — R0789 Other chest pain: Secondary | ICD-10-CM | POA: Diagnosis not present

## 2011-07-19 DIAGNOSIS — G4700 Insomnia, unspecified: Secondary | ICD-10-CM | POA: Diagnosis not present

## 2011-07-19 DIAGNOSIS — J449 Chronic obstructive pulmonary disease, unspecified: Secondary | ICD-10-CM | POA: Diagnosis not present

## 2011-07-19 DIAGNOSIS — F17200 Nicotine dependence, unspecified, uncomplicated: Secondary | ICD-10-CM | POA: Diagnosis not present

## 2011-07-19 DIAGNOSIS — F329 Major depressive disorder, single episode, unspecified: Secondary | ICD-10-CM | POA: Diagnosis not present

## 2011-07-19 DIAGNOSIS — J438 Other emphysema: Secondary | ICD-10-CM | POA: Diagnosis not present

## 2011-11-09 DIAGNOSIS — M961 Postlaminectomy syndrome, not elsewhere classified: Secondary | ICD-10-CM | POA: Diagnosis not present

## 2011-12-09 DIAGNOSIS — M961 Postlaminectomy syndrome, not elsewhere classified: Secondary | ICD-10-CM | POA: Diagnosis not present

## 2011-12-21 DIAGNOSIS — M5030 Other cervical disc degeneration, unspecified cervical region: Secondary | ICD-10-CM | POA: Diagnosis not present

## 2012-01-17 DIAGNOSIS — F329 Major depressive disorder, single episode, unspecified: Secondary | ICD-10-CM | POA: Diagnosis not present

## 2012-01-17 DIAGNOSIS — G4700 Insomnia, unspecified: Secondary | ICD-10-CM | POA: Diagnosis not present

## 2012-01-17 DIAGNOSIS — Z23 Encounter for immunization: Secondary | ICD-10-CM | POA: Diagnosis not present

## 2012-01-17 DIAGNOSIS — F17200 Nicotine dependence, unspecified, uncomplicated: Secondary | ICD-10-CM | POA: Diagnosis not present

## 2012-01-17 DIAGNOSIS — R0789 Other chest pain: Secondary | ICD-10-CM | POA: Diagnosis not present

## 2012-01-17 DIAGNOSIS — N529 Male erectile dysfunction, unspecified: Secondary | ICD-10-CM | POA: Diagnosis not present

## 2012-01-17 DIAGNOSIS — E7800 Pure hypercholesterolemia, unspecified: Secondary | ICD-10-CM | POA: Diagnosis not present

## 2012-01-17 DIAGNOSIS — J438 Other emphysema: Secondary | ICD-10-CM | POA: Diagnosis not present

## 2012-03-02 DIAGNOSIS — M5030 Other cervical disc degeneration, unspecified cervical region: Secondary | ICD-10-CM | POA: Diagnosis not present

## 2012-03-06 DIAGNOSIS — M961 Postlaminectomy syndrome, not elsewhere classified: Secondary | ICD-10-CM | POA: Diagnosis not present

## 2012-03-06 DIAGNOSIS — M5030 Other cervical disc degeneration, unspecified cervical region: Secondary | ICD-10-CM | POA: Diagnosis not present

## 2012-07-18 DIAGNOSIS — E7800 Pure hypercholesterolemia, unspecified: Secondary | ICD-10-CM | POA: Diagnosis not present

## 2012-08-01 DIAGNOSIS — G4700 Insomnia, unspecified: Secondary | ICD-10-CM | POA: Diagnosis not present

## 2012-08-01 DIAGNOSIS — F17200 Nicotine dependence, unspecified, uncomplicated: Secondary | ICD-10-CM | POA: Diagnosis not present

## 2012-08-01 DIAGNOSIS — R5383 Other fatigue: Secondary | ICD-10-CM | POA: Diagnosis not present

## 2012-08-01 DIAGNOSIS — E7800 Pure hypercholesterolemia, unspecified: Secondary | ICD-10-CM | POA: Diagnosis not present

## 2012-08-01 DIAGNOSIS — J438 Other emphysema: Secondary | ICD-10-CM | POA: Diagnosis not present

## 2012-08-01 DIAGNOSIS — F329 Major depressive disorder, single episode, unspecified: Secondary | ICD-10-CM | POA: Diagnosis not present

## 2012-08-01 DIAGNOSIS — N529 Male erectile dysfunction, unspecified: Secondary | ICD-10-CM | POA: Diagnosis not present

## 2012-08-01 DIAGNOSIS — R109 Unspecified abdominal pain: Secondary | ICD-10-CM | POA: Diagnosis not present

## 2012-08-14 DIAGNOSIS — F17200 Nicotine dependence, unspecified, uncomplicated: Secondary | ICD-10-CM | POA: Diagnosis not present

## 2012-08-14 DIAGNOSIS — E7800 Pure hypercholesterolemia, unspecified: Secondary | ICD-10-CM | POA: Diagnosis not present

## 2012-08-14 DIAGNOSIS — R5383 Other fatigue: Secondary | ICD-10-CM | POA: Diagnosis not present

## 2012-08-14 DIAGNOSIS — G4700 Insomnia, unspecified: Secondary | ICD-10-CM | POA: Diagnosis not present

## 2012-08-14 DIAGNOSIS — R5381 Other malaise: Secondary | ICD-10-CM | POA: Diagnosis not present

## 2012-08-16 DIAGNOSIS — R1032 Left lower quadrant pain: Secondary | ICD-10-CM | POA: Diagnosis not present

## 2012-08-16 DIAGNOSIS — R109 Unspecified abdominal pain: Secondary | ICD-10-CM | POA: Diagnosis not present

## 2012-08-16 DIAGNOSIS — N419 Inflammatory disease of prostate, unspecified: Secondary | ICD-10-CM | POA: Diagnosis not present

## 2012-09-06 DIAGNOSIS — M5030 Other cervical disc degeneration, unspecified cervical region: Secondary | ICD-10-CM | POA: Diagnosis not present

## 2012-09-06 DIAGNOSIS — M961 Postlaminectomy syndrome, not elsewhere classified: Secondary | ICD-10-CM | POA: Diagnosis not present

## 2012-09-06 DIAGNOSIS — G894 Chronic pain syndrome: Secondary | ICD-10-CM | POA: Diagnosis not present

## 2012-09-06 DIAGNOSIS — Z79899 Other long term (current) drug therapy: Secondary | ICD-10-CM | POA: Diagnosis not present

## 2012-09-14 DIAGNOSIS — R5381 Other malaise: Secondary | ICD-10-CM | POA: Diagnosis not present

## 2012-09-14 DIAGNOSIS — F17200 Nicotine dependence, unspecified, uncomplicated: Secondary | ICD-10-CM | POA: Diagnosis not present

## 2012-09-14 DIAGNOSIS — R5383 Other fatigue: Secondary | ICD-10-CM | POA: Diagnosis not present

## 2012-09-14 DIAGNOSIS — E7800 Pure hypercholesterolemia, unspecified: Secondary | ICD-10-CM | POA: Diagnosis not present

## 2012-09-14 DIAGNOSIS — G4700 Insomnia, unspecified: Secondary | ICD-10-CM | POA: Diagnosis not present

## 2012-09-18 DIAGNOSIS — M961 Postlaminectomy syndrome, not elsewhere classified: Secondary | ICD-10-CM | POA: Diagnosis not present

## 2012-09-18 DIAGNOSIS — M542 Cervicalgia: Secondary | ICD-10-CM | POA: Diagnosis not present

## 2012-12-11 DIAGNOSIS — M961 Postlaminectomy syndrome, not elsewhere classified: Secondary | ICD-10-CM | POA: Diagnosis not present

## 2012-12-11 DIAGNOSIS — M542 Cervicalgia: Secondary | ICD-10-CM | POA: Diagnosis not present

## 2013-01-08 DIAGNOSIS — F17200 Nicotine dependence, unspecified, uncomplicated: Secondary | ICD-10-CM | POA: Diagnosis not present

## 2013-01-08 DIAGNOSIS — E7800 Pure hypercholesterolemia, unspecified: Secondary | ICD-10-CM | POA: Diagnosis not present

## 2013-01-08 DIAGNOSIS — J438 Other emphysema: Secondary | ICD-10-CM | POA: Diagnosis not present

## 2013-01-08 DIAGNOSIS — F329 Major depressive disorder, single episode, unspecified: Secondary | ICD-10-CM | POA: Diagnosis not present

## 2013-01-08 DIAGNOSIS — M129 Arthropathy, unspecified: Secondary | ICD-10-CM | POA: Diagnosis not present

## 2013-01-17 DIAGNOSIS — M961 Postlaminectomy syndrome, not elsewhere classified: Secondary | ICD-10-CM | POA: Diagnosis not present

## 2013-02-05 DIAGNOSIS — M542 Cervicalgia: Secondary | ICD-10-CM | POA: Diagnosis not present

## 2013-02-05 DIAGNOSIS — M47817 Spondylosis without myelopathy or radiculopathy, lumbosacral region: Secondary | ICD-10-CM | POA: Diagnosis not present

## 2013-02-05 DIAGNOSIS — M961 Postlaminectomy syndrome, not elsewhere classified: Secondary | ICD-10-CM | POA: Diagnosis not present

## 2013-04-30 DIAGNOSIS — M545 Low back pain, unspecified: Secondary | ICD-10-CM | POA: Diagnosis not present

## 2013-04-30 DIAGNOSIS — M542 Cervicalgia: Secondary | ICD-10-CM | POA: Diagnosis not present

## 2013-05-18 DIAGNOSIS — Z91199 Patient's noncompliance with other medical treatment and regimen due to unspecified reason: Secondary | ICD-10-CM | POA: Diagnosis not present

## 2013-05-18 DIAGNOSIS — Z9119 Patient's noncompliance with other medical treatment and regimen: Secondary | ICD-10-CM | POA: Diagnosis not present

## 2013-05-18 DIAGNOSIS — R7309 Other abnormal glucose: Secondary | ICD-10-CM | POA: Diagnosis not present

## 2013-05-18 DIAGNOSIS — E7800 Pure hypercholesterolemia, unspecified: Secondary | ICD-10-CM | POA: Diagnosis present

## 2013-05-18 DIAGNOSIS — M5137 Other intervertebral disc degeneration, lumbosacral region: Secondary | ICD-10-CM | POA: Diagnosis present

## 2013-05-18 DIAGNOSIS — Z7982 Long term (current) use of aspirin: Secondary | ICD-10-CM | POA: Diagnosis not present

## 2013-05-18 DIAGNOSIS — Z23 Encounter for immunization: Secondary | ICD-10-CM | POA: Diagnosis not present

## 2013-05-18 DIAGNOSIS — Z79899 Other long term (current) drug therapy: Secondary | ICD-10-CM | POA: Diagnosis not present

## 2013-05-18 DIAGNOSIS — E1100 Type 2 diabetes mellitus with hyperosmolarity without nonketotic hyperglycemic-hyperosmolar coma (NKHHC): Secondary | ICD-10-CM | POA: Diagnosis not present

## 2013-05-18 DIAGNOSIS — F411 Generalized anxiety disorder: Secondary | ICD-10-CM | POA: Diagnosis present

## 2013-05-18 DIAGNOSIS — R7989 Other specified abnormal findings of blood chemistry: Secondary | ICD-10-CM | POA: Diagnosis not present

## 2013-05-18 DIAGNOSIS — E871 Hypo-osmolality and hyponatremia: Secondary | ICD-10-CM | POA: Diagnosis not present

## 2013-05-18 DIAGNOSIS — Z833 Family history of diabetes mellitus: Secondary | ICD-10-CM | POA: Diagnosis not present

## 2013-05-18 DIAGNOSIS — R109 Unspecified abdominal pain: Secondary | ICD-10-CM | POA: Diagnosis not present

## 2013-05-18 DIAGNOSIS — R071 Chest pain on breathing: Secondary | ICD-10-CM | POA: Diagnosis present

## 2013-05-18 DIAGNOSIS — F17200 Nicotine dependence, unspecified, uncomplicated: Secondary | ICD-10-CM | POA: Diagnosis present

## 2013-05-21 DIAGNOSIS — F17200 Nicotine dependence, unspecified, uncomplicated: Secondary | ICD-10-CM | POA: Diagnosis not present

## 2013-05-21 DIAGNOSIS — R109 Unspecified abdominal pain: Secondary | ICD-10-CM | POA: Diagnosis not present

## 2013-05-21 DIAGNOSIS — IMO0001 Reserved for inherently not codable concepts without codable children: Secondary | ICD-10-CM | POA: Diagnosis not present

## 2013-05-31 DIAGNOSIS — M129 Arthropathy, unspecified: Secondary | ICD-10-CM | POA: Diagnosis not present

## 2013-05-31 DIAGNOSIS — IMO0001 Reserved for inherently not codable concepts without codable children: Secondary | ICD-10-CM | POA: Diagnosis not present

## 2013-06-04 DIAGNOSIS — Z86010 Personal history of colonic polyps: Secondary | ICD-10-CM | POA: Diagnosis not present

## 2013-06-19 DIAGNOSIS — M542 Cervicalgia: Secondary | ICD-10-CM | POA: Diagnosis not present

## 2013-06-19 DIAGNOSIS — M5030 Other cervical disc degeneration, unspecified cervical region: Secondary | ICD-10-CM | POA: Diagnosis not present

## 2013-06-19 DIAGNOSIS — G894 Chronic pain syndrome: Secondary | ICD-10-CM | POA: Diagnosis not present

## 2013-06-19 DIAGNOSIS — M961 Postlaminectomy syndrome, not elsewhere classified: Secondary | ICD-10-CM | POA: Diagnosis not present

## 2013-07-02 DIAGNOSIS — E7800 Pure hypercholesterolemia, unspecified: Secondary | ICD-10-CM | POA: Diagnosis not present

## 2013-07-02 DIAGNOSIS — F329 Major depressive disorder, single episode, unspecified: Secondary | ICD-10-CM | POA: Diagnosis not present

## 2013-07-02 DIAGNOSIS — IMO0001 Reserved for inherently not codable concepts without codable children: Secondary | ICD-10-CM | POA: Diagnosis not present

## 2013-07-02 DIAGNOSIS — J438 Other emphysema: Secondary | ICD-10-CM | POA: Diagnosis not present

## 2013-07-02 DIAGNOSIS — F3289 Other specified depressive episodes: Secondary | ICD-10-CM | POA: Diagnosis not present

## 2013-08-28 DIAGNOSIS — M545 Low back pain, unspecified: Secondary | ICD-10-CM | POA: Diagnosis not present

## 2013-08-28 DIAGNOSIS — M542 Cervicalgia: Secondary | ICD-10-CM | POA: Diagnosis not present

## 2013-10-02 DIAGNOSIS — E7800 Pure hypercholesterolemia, unspecified: Secondary | ICD-10-CM | POA: Diagnosis not present

## 2013-10-02 DIAGNOSIS — J438 Other emphysema: Secondary | ICD-10-CM | POA: Diagnosis not present

## 2013-10-02 DIAGNOSIS — F329 Major depressive disorder, single episode, unspecified: Secondary | ICD-10-CM | POA: Diagnosis not present

## 2013-10-02 DIAGNOSIS — IMO0001 Reserved for inherently not codable concepts without codable children: Secondary | ICD-10-CM | POA: Diagnosis not present

## 2013-10-02 DIAGNOSIS — F17200 Nicotine dependence, unspecified, uncomplicated: Secondary | ICD-10-CM | POA: Diagnosis not present

## 2013-10-02 DIAGNOSIS — F3289 Other specified depressive episodes: Secondary | ICD-10-CM | POA: Diagnosis not present

## 2013-10-02 DIAGNOSIS — M129 Arthropathy, unspecified: Secondary | ICD-10-CM | POA: Diagnosis not present

## 2013-12-10 DIAGNOSIS — M545 Low back pain, unspecified: Secondary | ICD-10-CM | POA: Diagnosis not present

## 2013-12-10 DIAGNOSIS — M542 Cervicalgia: Secondary | ICD-10-CM | POA: Diagnosis not present

## 2014-05-20 DIAGNOSIS — M5416 Radiculopathy, lumbar region: Secondary | ICD-10-CM | POA: Diagnosis not present

## 2014-05-29 DIAGNOSIS — E780 Pure hypercholesterolemia: Secondary | ICD-10-CM | POA: Diagnosis not present

## 2014-05-29 DIAGNOSIS — Z720 Tobacco use: Secondary | ICD-10-CM | POA: Diagnosis not present

## 2014-05-29 DIAGNOSIS — E1165 Type 2 diabetes mellitus with hyperglycemia: Secondary | ICD-10-CM | POA: Diagnosis not present

## 2014-05-29 DIAGNOSIS — M1990 Unspecified osteoarthritis, unspecified site: Secondary | ICD-10-CM | POA: Diagnosis not present

## 2014-06-03 DIAGNOSIS — E780 Pure hypercholesterolemia: Secondary | ICD-10-CM | POA: Diagnosis not present

## 2014-06-03 DIAGNOSIS — F5221 Male erectile disorder: Secondary | ICD-10-CM | POA: Diagnosis not present

## 2014-06-03 DIAGNOSIS — E1165 Type 2 diabetes mellitus with hyperglycemia: Secondary | ICD-10-CM | POA: Diagnosis not present

## 2014-06-03 DIAGNOSIS — Z1389 Encounter for screening for other disorder: Secondary | ICD-10-CM | POA: Diagnosis not present

## 2014-06-03 DIAGNOSIS — F328 Other depressive episodes: Secondary | ICD-10-CM | POA: Diagnosis not present

## 2014-06-03 DIAGNOSIS — Z720 Tobacco use: Secondary | ICD-10-CM | POA: Diagnosis not present

## 2014-06-11 DIAGNOSIS — M47812 Spondylosis without myelopathy or radiculopathy, cervical region: Secondary | ICD-10-CM | POA: Diagnosis not present

## 2014-07-08 DIAGNOSIS — M5136 Other intervertebral disc degeneration, lumbar region: Secondary | ICD-10-CM | POA: Diagnosis not present

## 2014-07-08 DIAGNOSIS — M961 Postlaminectomy syndrome, not elsewhere classified: Secondary | ICD-10-CM | POA: Diagnosis not present

## 2014-07-08 DIAGNOSIS — G894 Chronic pain syndrome: Secondary | ICD-10-CM | POA: Diagnosis not present

## 2014-07-08 DIAGNOSIS — M47812 Spondylosis without myelopathy or radiculopathy, cervical region: Secondary | ICD-10-CM | POA: Diagnosis not present

## 2014-07-18 DIAGNOSIS — M5136 Other intervertebral disc degeneration, lumbar region: Secondary | ICD-10-CM | POA: Diagnosis not present

## 2014-07-23 DIAGNOSIS — M5136 Other intervertebral disc degeneration, lumbar region: Secondary | ICD-10-CM | POA: Diagnosis not present

## 2014-07-23 DIAGNOSIS — Z79891 Long term (current) use of opiate analgesic: Secondary | ICD-10-CM | POA: Diagnosis not present

## 2014-07-23 DIAGNOSIS — M961 Postlaminectomy syndrome, not elsewhere classified: Secondary | ICD-10-CM | POA: Diagnosis not present

## 2014-09-03 DIAGNOSIS — M5136 Other intervertebral disc degeneration, lumbar region: Secondary | ICD-10-CM | POA: Diagnosis not present

## 2014-09-03 DIAGNOSIS — M5416 Radiculopathy, lumbar region: Secondary | ICD-10-CM | POA: Diagnosis not present

## 2014-09-04 DIAGNOSIS — E1165 Type 2 diabetes mellitus with hyperglycemia: Secondary | ICD-10-CM | POA: Diagnosis not present

## 2014-09-04 DIAGNOSIS — E780 Pure hypercholesterolemia: Secondary | ICD-10-CM | POA: Diagnosis not present

## 2014-09-10 DIAGNOSIS — E1165 Type 2 diabetes mellitus with hyperglycemia: Secondary | ICD-10-CM | POA: Diagnosis not present

## 2014-09-10 DIAGNOSIS — Z720 Tobacco use: Secondary | ICD-10-CM | POA: Diagnosis not present

## 2014-09-10 DIAGNOSIS — E780 Pure hypercholesterolemia: Secondary | ICD-10-CM | POA: Diagnosis not present

## 2014-09-10 DIAGNOSIS — F328 Other depressive episodes: Secondary | ICD-10-CM | POA: Diagnosis not present

## 2014-11-16 DIAGNOSIS — Z79899 Other long term (current) drug therapy: Secondary | ICD-10-CM | POA: Diagnosis not present

## 2014-11-16 DIAGNOSIS — F17200 Nicotine dependence, unspecified, uncomplicated: Secondary | ICD-10-CM | POA: Diagnosis not present

## 2014-11-16 DIAGNOSIS — M5441 Lumbago with sciatica, right side: Secondary | ICD-10-CM | POA: Diagnosis not present

## 2014-11-17 DIAGNOSIS — G894 Chronic pain syndrome: Secondary | ICD-10-CM | POA: Diagnosis not present

## 2014-11-17 DIAGNOSIS — M545 Low back pain: Secondary | ICD-10-CM | POA: Diagnosis not present

## 2014-11-17 DIAGNOSIS — Z79891 Long term (current) use of opiate analgesic: Secondary | ICD-10-CM | POA: Diagnosis not present

## 2014-11-17 DIAGNOSIS — M47812 Spondylosis without myelopathy or radiculopathy, cervical region: Secondary | ICD-10-CM | POA: Diagnosis not present

## 2014-11-17 DIAGNOSIS — M961 Postlaminectomy syndrome, not elsewhere classified: Secondary | ICD-10-CM | POA: Diagnosis not present

## 2015-01-06 DIAGNOSIS — M47817 Spondylosis without myelopathy or radiculopathy, lumbosacral region: Secondary | ICD-10-CM | POA: Diagnosis not present

## 2015-04-16 DIAGNOSIS — M961 Postlaminectomy syndrome, not elsewhere classified: Secondary | ICD-10-CM | POA: Diagnosis not present

## 2015-04-16 DIAGNOSIS — M5442 Lumbago with sciatica, left side: Secondary | ICD-10-CM | POA: Diagnosis not present

## 2015-04-16 DIAGNOSIS — G894 Chronic pain syndrome: Secondary | ICD-10-CM | POA: Diagnosis not present

## 2015-04-16 DIAGNOSIS — M545 Low back pain: Secondary | ICD-10-CM | POA: Diagnosis not present

## 2015-04-28 DIAGNOSIS — D485 Neoplasm of uncertain behavior of skin: Secondary | ICD-10-CM | POA: Diagnosis not present

## 2015-04-28 DIAGNOSIS — H61009 Unspecified perichondritis of external ear, unspecified ear: Secondary | ICD-10-CM | POA: Diagnosis not present

## 2015-04-28 DIAGNOSIS — D225 Melanocytic nevi of trunk: Secondary | ICD-10-CM | POA: Diagnosis not present

## 2015-04-28 DIAGNOSIS — B079 Viral wart, unspecified: Secondary | ICD-10-CM | POA: Diagnosis not present

## 2015-05-19 DIAGNOSIS — M5136 Other intervertebral disc degeneration, lumbar region: Secondary | ICD-10-CM | POA: Diagnosis not present

## 2015-06-02 DIAGNOSIS — M961 Postlaminectomy syndrome, not elsewhere classified: Secondary | ICD-10-CM | POA: Diagnosis not present

## 2015-06-02 DIAGNOSIS — M5136 Other intervertebral disc degeneration, lumbar region: Secondary | ICD-10-CM | POA: Diagnosis not present

## 2015-06-02 DIAGNOSIS — G8929 Other chronic pain: Secondary | ICD-10-CM | POA: Diagnosis not present

## 2015-06-02 DIAGNOSIS — M5442 Lumbago with sciatica, left side: Secondary | ICD-10-CM | POA: Diagnosis not present

## 2015-06-18 DIAGNOSIS — R5382 Chronic fatigue, unspecified: Secondary | ICD-10-CM | POA: Diagnosis not present

## 2015-06-18 DIAGNOSIS — M1990 Unspecified osteoarthritis, unspecified site: Secondary | ICD-10-CM | POA: Diagnosis not present

## 2015-06-18 DIAGNOSIS — E7800 Pure hypercholesterolemia, unspecified: Secondary | ICD-10-CM | POA: Diagnosis not present

## 2015-06-18 DIAGNOSIS — E1165 Type 2 diabetes mellitus with hyperglycemia: Secondary | ICD-10-CM | POA: Diagnosis not present

## 2015-06-23 DIAGNOSIS — Z1389 Encounter for screening for other disorder: Secondary | ICD-10-CM | POA: Diagnosis not present

## 2015-06-23 DIAGNOSIS — F330 Major depressive disorder, recurrent, mild: Secondary | ICD-10-CM | POA: Diagnosis not present

## 2015-06-23 DIAGNOSIS — Z720 Tobacco use: Secondary | ICD-10-CM | POA: Diagnosis not present

## 2015-06-23 DIAGNOSIS — Z13220 Encounter for screening for lipoid disorders: Secondary | ICD-10-CM | POA: Diagnosis not present

## 2015-06-23 DIAGNOSIS — E1165 Type 2 diabetes mellitus with hyperglycemia: Secondary | ICD-10-CM | POA: Diagnosis not present

## 2015-08-11 DIAGNOSIS — M5442 Lumbago with sciatica, left side: Secondary | ICD-10-CM | POA: Diagnosis not present

## 2015-08-11 DIAGNOSIS — M47812 Spondylosis without myelopathy or radiculopathy, cervical region: Secondary | ICD-10-CM | POA: Diagnosis not present

## 2015-08-11 DIAGNOSIS — G8929 Other chronic pain: Secondary | ICD-10-CM | POA: Diagnosis not present

## 2015-08-11 DIAGNOSIS — G894 Chronic pain syndrome: Secondary | ICD-10-CM | POA: Diagnosis not present

## 2015-09-01 DIAGNOSIS — M47812 Spondylosis without myelopathy or radiculopathy, cervical region: Secondary | ICD-10-CM | POA: Diagnosis not present

## 2015-09-01 DIAGNOSIS — M5136 Other intervertebral disc degeneration, lumbar region: Secondary | ICD-10-CM | POA: Diagnosis not present

## 2015-09-25 DIAGNOSIS — E1165 Type 2 diabetes mellitus with hyperglycemia: Secondary | ICD-10-CM | POA: Diagnosis not present

## 2015-09-25 DIAGNOSIS — E7800 Pure hypercholesterolemia, unspecified: Secondary | ICD-10-CM | POA: Diagnosis not present

## 2015-10-06 DIAGNOSIS — F5221 Male erectile disorder: Secondary | ICD-10-CM | POA: Diagnosis not present

## 2015-10-06 DIAGNOSIS — F330 Major depressive disorder, recurrent, mild: Secondary | ICD-10-CM | POA: Diagnosis not present

## 2015-10-06 DIAGNOSIS — R5382 Chronic fatigue, unspecified: Secondary | ICD-10-CM | POA: Diagnosis not present

## 2015-10-06 DIAGNOSIS — Z720 Tobacco use: Secondary | ICD-10-CM | POA: Diagnosis not present

## 2015-10-06 DIAGNOSIS — Z13220 Encounter for screening for lipoid disorders: Secondary | ICD-10-CM | POA: Diagnosis not present

## 2015-10-06 DIAGNOSIS — M1990 Unspecified osteoarthritis, unspecified site: Secondary | ICD-10-CM | POA: Diagnosis not present

## 2015-10-06 DIAGNOSIS — E1165 Type 2 diabetes mellitus with hyperglycemia: Secondary | ICD-10-CM | POA: Diagnosis not present

## 2015-10-06 DIAGNOSIS — M545 Low back pain: Secondary | ICD-10-CM | POA: Diagnosis not present

## 2015-11-12 DIAGNOSIS — M47812 Spondylosis without myelopathy or radiculopathy, cervical region: Secondary | ICD-10-CM | POA: Diagnosis not present

## 2015-11-12 DIAGNOSIS — Z79891 Long term (current) use of opiate analgesic: Secondary | ICD-10-CM | POA: Diagnosis not present

## 2015-11-12 DIAGNOSIS — M961 Postlaminectomy syndrome, not elsewhere classified: Secondary | ICD-10-CM | POA: Diagnosis not present

## 2015-11-12 DIAGNOSIS — M5136 Other intervertebral disc degeneration, lumbar region: Secondary | ICD-10-CM | POA: Diagnosis not present

## 2015-12-09 DIAGNOSIS — M47812 Spondylosis without myelopathy or radiculopathy, cervical region: Secondary | ICD-10-CM | POA: Diagnosis not present

## 2015-12-09 DIAGNOSIS — M5136 Other intervertebral disc degeneration, lumbar region: Secondary | ICD-10-CM | POA: Diagnosis not present

## 2016-02-23 DIAGNOSIS — M47812 Spondylosis without myelopathy or radiculopathy, cervical region: Secondary | ICD-10-CM | POA: Diagnosis not present

## 2016-02-23 DIAGNOSIS — M961 Postlaminectomy syndrome, not elsewhere classified: Secondary | ICD-10-CM | POA: Diagnosis not present

## 2016-04-13 DIAGNOSIS — E1165 Type 2 diabetes mellitus with hyperglycemia: Secondary | ICD-10-CM | POA: Diagnosis not present

## 2016-04-13 DIAGNOSIS — E7800 Pure hypercholesterolemia, unspecified: Secondary | ICD-10-CM | POA: Diagnosis not present

## 2016-04-15 DIAGNOSIS — Z6827 Body mass index (BMI) 27.0-27.9, adult: Secondary | ICD-10-CM | POA: Diagnosis not present

## 2016-04-15 DIAGNOSIS — I10 Essential (primary) hypertension: Secondary | ICD-10-CM | POA: Diagnosis not present

## 2016-04-15 DIAGNOSIS — E039 Hypothyroidism, unspecified: Secondary | ICD-10-CM | POA: Diagnosis not present

## 2016-04-15 DIAGNOSIS — E1165 Type 2 diabetes mellitus with hyperglycemia: Secondary | ICD-10-CM | POA: Diagnosis not present

## 2016-05-17 DIAGNOSIS — M791 Myalgia: Secondary | ICD-10-CM | POA: Diagnosis not present

## 2016-05-17 DIAGNOSIS — Z6827 Body mass index (BMI) 27.0-27.9, adult: Secondary | ICD-10-CM | POA: Diagnosis not present

## 2016-05-17 DIAGNOSIS — R5383 Other fatigue: Secondary | ICD-10-CM | POA: Diagnosis not present

## 2016-05-19 DIAGNOSIS — J40 Bronchitis, not specified as acute or chronic: Secondary | ICD-10-CM | POA: Diagnosis not present

## 2016-06-02 DIAGNOSIS — E1165 Type 2 diabetes mellitus with hyperglycemia: Secondary | ICD-10-CM | POA: Diagnosis not present

## 2016-06-02 DIAGNOSIS — Z6826 Body mass index (BMI) 26.0-26.9, adult: Secondary | ICD-10-CM | POA: Diagnosis not present

## 2016-06-15 DIAGNOSIS — M5136 Other intervertebral disc degeneration, lumbar region: Secondary | ICD-10-CM | POA: Diagnosis not present

## 2016-06-15 DIAGNOSIS — M47812 Spondylosis without myelopathy or radiculopathy, cervical region: Secondary | ICD-10-CM | POA: Diagnosis not present

## 2016-07-28 DIAGNOSIS — G5702 Lesion of sciatic nerve, left lower limb: Secondary | ICD-10-CM | POA: Diagnosis not present

## 2016-08-04 ENCOUNTER — Other Ambulatory Visit: Payer: Self-pay | Admitting: Physical Medicine and Rehabilitation

## 2016-08-04 DIAGNOSIS — M961 Postlaminectomy syndrome, not elsewhere classified: Secondary | ICD-10-CM

## 2016-08-18 ENCOUNTER — Ambulatory Visit
Admission: RE | Admit: 2016-08-18 | Discharge: 2016-08-18 | Disposition: A | Discharge status: Home / Self Care | Payer: 59 | Source: Ambulatory Visit | Attending: Physical Medicine and Rehabilitation | Admitting: Physical Medicine and Rehabilitation

## 2016-08-18 DIAGNOSIS — M961 Postlaminectomy syndrome, not elsewhere classified: Secondary | ICD-10-CM

## 2016-08-18 DIAGNOSIS — M5126 Other intervertebral disc displacement, lumbar region: Secondary | ICD-10-CM | POA: Diagnosis not present

## 2016-08-18 MED ORDER — GADOBENATE DIMEGLUMINE 529 MG/ML IV SOLN
17.0000 mL | Freq: Once | INTRAVENOUS | Status: AC | PRN
  Administered 2016-08-18: 17 mL via INTRAVENOUS

## 2016-08-30 DIAGNOSIS — F330 Major depressive disorder, recurrent, mild: Secondary | ICD-10-CM | POA: Diagnosis not present

## 2016-08-30 DIAGNOSIS — E039 Hypothyroidism, unspecified: Secondary | ICD-10-CM | POA: Diagnosis not present

## 2016-08-30 DIAGNOSIS — E7800 Pure hypercholesterolemia, unspecified: Secondary | ICD-10-CM | POA: Diagnosis not present

## 2016-08-30 DIAGNOSIS — Z720 Tobacco use: Secondary | ICD-10-CM | POA: Diagnosis not present

## 2016-08-30 DIAGNOSIS — R5382 Chronic fatigue, unspecified: Secondary | ICD-10-CM | POA: Diagnosis not present

## 2016-08-30 DIAGNOSIS — I10 Essential (primary) hypertension: Secondary | ICD-10-CM | POA: Diagnosis not present

## 2016-08-30 DIAGNOSIS — E1165 Type 2 diabetes mellitus with hyperglycemia: Secondary | ICD-10-CM | POA: Diagnosis not present

## 2016-09-01 DIAGNOSIS — M545 Low back pain: Secondary | ICD-10-CM | POA: Diagnosis not present

## 2016-09-01 DIAGNOSIS — F330 Major depressive disorder, recurrent, mild: Secondary | ICD-10-CM | POA: Diagnosis not present

## 2016-09-01 DIAGNOSIS — R5382 Chronic fatigue, unspecified: Secondary | ICD-10-CM | POA: Diagnosis not present

## 2016-09-01 DIAGNOSIS — Z6826 Body mass index (BMI) 26.0-26.9, adult: Secondary | ICD-10-CM | POA: Diagnosis not present

## 2016-09-01 DIAGNOSIS — G4700 Insomnia, unspecified: Secondary | ICD-10-CM | POA: Diagnosis not present

## 2016-09-01 DIAGNOSIS — I10 Essential (primary) hypertension: Secondary | ICD-10-CM | POA: Diagnosis not present

## 2016-09-01 DIAGNOSIS — M1990 Unspecified osteoarthritis, unspecified site: Secondary | ICD-10-CM | POA: Diagnosis not present

## 2016-09-01 DIAGNOSIS — E1165 Type 2 diabetes mellitus with hyperglycemia: Secondary | ICD-10-CM | POA: Diagnosis not present

## 2016-09-02 DIAGNOSIS — M961 Postlaminectomy syndrome, not elsewhere classified: Secondary | ICD-10-CM | POA: Diagnosis not present

## 2016-09-02 DIAGNOSIS — M5136 Other intervertebral disc degeneration, lumbar region: Secondary | ICD-10-CM | POA: Diagnosis not present

## 2016-09-12 DIAGNOSIS — M5136 Other intervertebral disc degeneration, lumbar region: Secondary | ICD-10-CM | POA: Diagnosis not present

## 2016-09-22 DIAGNOSIS — M961 Postlaminectomy syndrome, not elsewhere classified: Secondary | ICD-10-CM | POA: Diagnosis not present

## 2016-09-28 DIAGNOSIS — M5136 Other intervertebral disc degeneration, lumbar region: Secondary | ICD-10-CM | POA: Diagnosis not present

## 2016-10-14 DIAGNOSIS — M5136 Other intervertebral disc degeneration, lumbar region: Secondary | ICD-10-CM | POA: Diagnosis not present

## 2016-10-14 DIAGNOSIS — M961 Postlaminectomy syndrome, not elsewhere classified: Secondary | ICD-10-CM | POA: Diagnosis not present

## 2016-11-22 DIAGNOSIS — M5136 Other intervertebral disc degeneration, lumbar region: Secondary | ICD-10-CM | POA: Diagnosis not present

## 2016-12-21 DIAGNOSIS — M1990 Unspecified osteoarthritis, unspecified site: Secondary | ICD-10-CM | POA: Diagnosis not present

## 2016-12-21 DIAGNOSIS — M545 Low back pain: Secondary | ICD-10-CM | POA: Diagnosis not present

## 2016-12-21 DIAGNOSIS — F330 Major depressive disorder, recurrent, mild: Secondary | ICD-10-CM | POA: Diagnosis not present

## 2016-12-21 DIAGNOSIS — I10 Essential (primary) hypertension: Secondary | ICD-10-CM | POA: Diagnosis not present

## 2016-12-21 DIAGNOSIS — Z6826 Body mass index (BMI) 26.0-26.9, adult: Secondary | ICD-10-CM | POA: Diagnosis not present

## 2016-12-21 DIAGNOSIS — G4700 Insomnia, unspecified: Secondary | ICD-10-CM | POA: Diagnosis not present

## 2016-12-21 DIAGNOSIS — R5382 Chronic fatigue, unspecified: Secondary | ICD-10-CM | POA: Diagnosis not present

## 2016-12-21 DIAGNOSIS — E1165 Type 2 diabetes mellitus with hyperglycemia: Secondary | ICD-10-CM | POA: Diagnosis not present

## 2017-02-11 DIAGNOSIS — M961 Postlaminectomy syndrome, not elsewhere classified: Secondary | ICD-10-CM | POA: Diagnosis not present

## 2017-02-11 DIAGNOSIS — M5136 Other intervertebral disc degeneration, lumbar region: Secondary | ICD-10-CM | POA: Diagnosis not present

## 2017-02-24 DIAGNOSIS — I10 Essential (primary) hypertension: Secondary | ICD-10-CM | POA: Diagnosis not present

## 2017-02-24 DIAGNOSIS — E1165 Type 2 diabetes mellitus with hyperglycemia: Secondary | ICD-10-CM | POA: Diagnosis not present

## 2017-02-24 DIAGNOSIS — E7800 Pure hypercholesterolemia, unspecified: Secondary | ICD-10-CM | POA: Diagnosis not present

## 2017-02-28 DIAGNOSIS — F3289 Other specified depressive episodes: Secondary | ICD-10-CM | POA: Diagnosis not present

## 2017-02-28 DIAGNOSIS — M545 Low back pain: Secondary | ICD-10-CM | POA: Diagnosis not present

## 2017-02-28 DIAGNOSIS — E1165 Type 2 diabetes mellitus with hyperglycemia: Secondary | ICD-10-CM | POA: Diagnosis not present

## 2017-02-28 DIAGNOSIS — Z720 Tobacco use: Secondary | ICD-10-CM | POA: Diagnosis not present

## 2017-02-28 DIAGNOSIS — Z6827 Body mass index (BMI) 27.0-27.9, adult: Secondary | ICD-10-CM | POA: Diagnosis not present

## 2017-02-28 DIAGNOSIS — G4700 Insomnia, unspecified: Secondary | ICD-10-CM | POA: Diagnosis not present

## 2017-02-28 DIAGNOSIS — I10 Essential (primary) hypertension: Secondary | ICD-10-CM | POA: Diagnosis not present

## 2017-02-28 DIAGNOSIS — Z23 Encounter for immunization: Secondary | ICD-10-CM | POA: Diagnosis not present

## 2017-03-17 DIAGNOSIS — M542 Cervicalgia: Secondary | ICD-10-CM | POA: Diagnosis not present

## 2017-03-17 DIAGNOSIS — M961 Postlaminectomy syndrome, not elsewhere classified: Secondary | ICD-10-CM | POA: Diagnosis not present

## 2017-03-17 DIAGNOSIS — M5136 Other intervertebral disc degeneration, lumbar region: Secondary | ICD-10-CM | POA: Diagnosis not present

## 2017-03-25 DIAGNOSIS — M961 Postlaminectomy syndrome, not elsewhere classified: Secondary | ICD-10-CM | POA: Diagnosis not present

## 2017-03-30 DIAGNOSIS — M5136 Other intervertebral disc degeneration, lumbar region: Secondary | ICD-10-CM | POA: Diagnosis not present

## 2017-03-30 DIAGNOSIS — Z79891 Long term (current) use of opiate analgesic: Secondary | ICD-10-CM | POA: Diagnosis not present

## 2017-03-30 DIAGNOSIS — M50821 Other cervical disc disorders at C4-C5 level: Secondary | ICD-10-CM | POA: Diagnosis not present

## 2017-03-30 DIAGNOSIS — M542 Cervicalgia: Secondary | ICD-10-CM | POA: Diagnosis not present

## 2017-03-30 DIAGNOSIS — M961 Postlaminectomy syndrome, not elsewhere classified: Secondary | ICD-10-CM | POA: Diagnosis not present

## 2017-04-25 DIAGNOSIS — M25512 Pain in left shoulder: Secondary | ICD-10-CM | POA: Diagnosis not present

## 2017-04-25 DIAGNOSIS — M5412 Radiculopathy, cervical region: Secondary | ICD-10-CM | POA: Diagnosis not present

## 2017-04-25 DIAGNOSIS — M4722 Other spondylosis with radiculopathy, cervical region: Secondary | ICD-10-CM | POA: Diagnosis not present

## 2017-04-25 DIAGNOSIS — M25519 Pain in unspecified shoulder: Secondary | ICD-10-CM | POA: Diagnosis not present

## 2017-04-25 DIAGNOSIS — M5033 Other cervical disc degeneration, cervicothoracic region: Secondary | ICD-10-CM | POA: Diagnosis not present

## 2017-05-02 DIAGNOSIS — M25512 Pain in left shoulder: Secondary | ICD-10-CM | POA: Diagnosis not present

## 2017-05-04 DIAGNOSIS — G894 Chronic pain syndrome: Secondary | ICD-10-CM | POA: Diagnosis present

## 2017-05-04 DIAGNOSIS — M5136 Other intervertebral disc degeneration, lumbar region: Secondary | ICD-10-CM | POA: Diagnosis not present

## 2017-05-04 DIAGNOSIS — M25512 Pain in left shoulder: Secondary | ICD-10-CM | POA: Diagnosis not present

## 2017-05-04 DIAGNOSIS — M542 Cervicalgia: Secondary | ICD-10-CM | POA: Diagnosis not present

## 2017-05-04 DIAGNOSIS — F17200 Nicotine dependence, unspecified, uncomplicated: Secondary | ICD-10-CM | POA: Diagnosis not present

## 2017-05-04 DIAGNOSIS — M51369 Other intervertebral disc degeneration, lumbar region without mention of lumbar back pain or lower extremity pain: Secondary | ICD-10-CM | POA: Diagnosis present

## 2017-05-04 DIAGNOSIS — M5030 Other cervical disc degeneration, unspecified cervical region: Secondary | ICD-10-CM | POA: Diagnosis not present

## 2017-05-26 DIAGNOSIS — M5136 Other intervertebral disc degeneration, lumbar region: Secondary | ICD-10-CM | POA: Diagnosis not present

## 2017-05-26 DIAGNOSIS — M7541 Impingement syndrome of right shoulder: Secondary | ICD-10-CM | POA: Diagnosis not present

## 2017-05-26 DIAGNOSIS — M25512 Pain in left shoulder: Secondary | ICD-10-CM | POA: Diagnosis not present

## 2017-05-26 DIAGNOSIS — M75102 Unspecified rotator cuff tear or rupture of left shoulder, not specified as traumatic: Secondary | ICD-10-CM | POA: Diagnosis not present

## 2017-05-29 DIAGNOSIS — M542 Cervicalgia: Secondary | ICD-10-CM | POA: Diagnosis not present

## 2017-06-06 DIAGNOSIS — Z1283 Encounter for screening for malignant neoplasm of skin: Secondary | ICD-10-CM | POA: Diagnosis not present

## 2017-06-06 DIAGNOSIS — L570 Actinic keratosis: Secondary | ICD-10-CM | POA: Diagnosis not present

## 2017-06-06 DIAGNOSIS — X32XXXD Exposure to sunlight, subsequent encounter: Secondary | ICD-10-CM | POA: Diagnosis not present

## 2017-06-06 DIAGNOSIS — L820 Inflamed seborrheic keratosis: Secondary | ICD-10-CM | POA: Diagnosis not present

## 2017-06-09 DIAGNOSIS — G894 Chronic pain syndrome: Secondary | ICD-10-CM | POA: Diagnosis not present

## 2017-06-20 DIAGNOSIS — R05 Cough: Secondary | ICD-10-CM | POA: Diagnosis not present

## 2017-06-20 DIAGNOSIS — Z6827 Body mass index (BMI) 27.0-27.9, adult: Secondary | ICD-10-CM | POA: Diagnosis not present

## 2017-06-20 DIAGNOSIS — J069 Acute upper respiratory infection, unspecified: Secondary | ICD-10-CM | POA: Diagnosis not present

## 2017-06-22 ENCOUNTER — Ambulatory Visit: Payer: Self-pay | Admitting: Orthopedic Surgery

## 2017-06-28 DIAGNOSIS — Z6827 Body mass index (BMI) 27.0-27.9, adult: Secondary | ICD-10-CM | POA: Diagnosis not present

## 2017-06-28 DIAGNOSIS — I10 Essential (primary) hypertension: Secondary | ICD-10-CM | POA: Diagnosis not present

## 2017-06-28 DIAGNOSIS — G4700 Insomnia, unspecified: Secondary | ICD-10-CM | POA: Diagnosis not present

## 2017-06-28 DIAGNOSIS — J439 Emphysema, unspecified: Secondary | ICD-10-CM | POA: Diagnosis not present

## 2017-07-04 ENCOUNTER — Encounter (HOSPITAL_COMMUNITY)
Admission: RE | Admit: 2017-07-04 | Discharge: 2017-07-04 | Disposition: A | Discharge status: Home / Self Care | Payer: 59 | Source: Ambulatory Visit | Attending: Specialist | Admitting: Specialist

## 2017-07-04 ENCOUNTER — Encounter (HOSPITAL_COMMUNITY): Payer: Self-pay

## 2017-07-04 ENCOUNTER — Other Ambulatory Visit: Payer: Self-pay

## 2017-07-04 DIAGNOSIS — Z794 Long term (current) use of insulin: Secondary | ICD-10-CM | POA: Diagnosis not present

## 2017-07-04 DIAGNOSIS — G4730 Sleep apnea, unspecified: Secondary | ICD-10-CM | POA: Diagnosis not present

## 2017-07-04 DIAGNOSIS — Z8249 Family history of ischemic heart disease and other diseases of the circulatory system: Secondary | ICD-10-CM | POA: Diagnosis not present

## 2017-07-04 DIAGNOSIS — E119 Type 2 diabetes mellitus without complications: Secondary | ICD-10-CM

## 2017-07-04 DIAGNOSIS — Z01810 Encounter for preprocedural cardiovascular examination: Secondary | ICD-10-CM

## 2017-07-04 DIAGNOSIS — R9431 Abnormal electrocardiogram [ECG] [EKG]: Secondary | ICD-10-CM

## 2017-07-04 DIAGNOSIS — M75122 Complete rotator cuff tear or rupture of left shoulder, not specified as traumatic: Secondary | ICD-10-CM | POA: Diagnosis not present

## 2017-07-04 DIAGNOSIS — Z01812 Encounter for preprocedural laboratory examination: Secondary | ICD-10-CM | POA: Insufficient documentation

## 2017-07-04 DIAGNOSIS — Z79899 Other long term (current) drug therapy: Secondary | ICD-10-CM | POA: Diagnosis not present

## 2017-07-04 DIAGNOSIS — M7542 Impingement syndrome of left shoulder: Secondary | ICD-10-CM | POA: Diagnosis not present

## 2017-07-04 DIAGNOSIS — M1990 Unspecified osteoarthritis, unspecified site: Secondary | ICD-10-CM | POA: Diagnosis not present

## 2017-07-04 DIAGNOSIS — M75102 Unspecified rotator cuff tear or rupture of left shoulder, not specified as traumatic: Secondary | ICD-10-CM | POA: Diagnosis present

## 2017-07-04 DIAGNOSIS — Z87891 Personal history of nicotine dependence: Secondary | ICD-10-CM | POA: Diagnosis not present

## 2017-07-04 HISTORY — DX: Sleep apnea, unspecified: G47.30

## 2017-07-04 HISTORY — DX: Type 2 diabetes mellitus without complications: E11.9

## 2017-07-04 HISTORY — DX: Unspecified osteoarthritis, unspecified site: M19.90

## 2017-07-04 LAB — BASIC METABOLIC PANEL
Anion gap: 9 (ref 5–15)
BUN: 9 mg/dL (ref 6–20)
CHLORIDE: 101 mmol/L (ref 101–111)
CO2: 23 mmol/L (ref 22–32)
CREATININE: 0.99 mg/dL (ref 0.61–1.24)
Calcium: 9.2 mg/dL (ref 8.9–10.3)
GFR calc Af Amer: >60 mL/min (ref >60)
GFR calc non Af Amer: >60 mL/min (ref >60)
Glucose, Bld: 254 mg/dL — ABNORMAL HIGH (ref 65–99)
Potassium: 4.2 mmol/L (ref 3.5–5.1)
SODIUM: 133 mmol/L — AB (ref 135–145)

## 2017-07-04 LAB — HEMOGLOBIN A1C
Hgb A1c MFr Bld: 8.5 % — ABNORMAL HIGH (ref 4.8–5.6)
Mean Plasma Glucose: 197.25 mg/dL

## 2017-07-04 LAB — CBC
HCT: 43.7 % (ref 39.0–52.0)
Hemoglobin: 14.9 g/dL (ref 13.0–17.0)
MCH: 28.7 pg (ref 26.0–34.0)
MCHC: 34.1 g/dL (ref 30.0–36.0)
MCV: 84 fL (ref 78.0–100.0)
PLATELETS: 230 10*3/uL (ref 150–400)
RBC: 5.2 MIL/uL (ref 4.22–5.81)
RDW: 13.6 % (ref 11.5–15.5)
WBC: 8.2 10*3/uL (ref 4.0–10.5)

## 2017-07-04 LAB — GLUCOSE, CAPILLARY: Glucose-Capillary: 249 mg/dL — ABNORMAL HIGH (ref 65–99)

## 2017-07-04 NOTE — Pre-Procedure Instructions (Signed)
Miguel [name]  07/04/2017      [id number] - [place], [place] [address] [place] [address] Phone: [phone] Fax: [phone]    Your procedure is scheduled on March 29  Report to [place] at 1100 A.M.  Call this number if you have problems the morning of surgery:  [phone]   Remember:  Do not eat food or drink liquids after midnight.  Take these medicines the morning of surgery with A SIP OF WATER baclofen (Lioresal), symbicort inhaler- bring inhalers with you on the day of surgery, gabapentin (Neurontin), oxycodone if needed, sertraline (Zoloft)  Stop taking aspirin, BC's, Goody's, Herbal medications, Fish Oil, Ibuprofen, Advil, Motrin, Vitamins    How to Manage Your Diabetes Before and After Surgery  Why is it important to control my blood sugar before and after surgery? . Improving blood sugar levels before and after surgery helps healing and can limit problems. . A way of improving blood sugar control is eating a healthy diet by: o  Eating less sugar and carbohydrates o  Increasing activity/exercise o  Talking with your doctor about reaching your blood sugar goals . High blood sugars (greater than 180 mg/dL) can raise your risk of infections and slow your recovery, so you will need to focus on controlling your diabetes during the weeks before surgery. . Make sure that the doctor who takes care of your diabetes knows about your planned surgery including the date and location.  How do I manage my blood sugar before surgery? . Check your blood sugar at least 4 times a day, starting 2 days before surgery, to make sure that the level is not too high or low. o Check your blood sugar the morning of your surgery when you wake up and every 2 hours until you get to the Short Stay unit. . If your blood sugar is less than 70 mg/dL, you will need to treat for low blood sugar: o Do not  take insulin. o Treat a low blood sugar (less than 70 mg/dL) with  cup of clear juice (cranberry or apple), 4 glucose tablets, OR glucose gel. Recheck blood sugar in 15 minutes after treatment (to make sure it is greater than 70 mg/dL). If your blood sugar is not greater than 70 mg/dL on recheck, call [phone] o  for further instructions. . Report your blood sugar to the short stay nurse when you get to Short Stay.  . If you are admitted to the hospital after surgery: o Your blood sugar will be checked by the staff and you will probably be given insulin after surgery (instead of oral diabetes medicines) to make sure you have good blood sugar levels. o The goal for blood sugar control after surgery is 80-180 mg/dL.              WHAT DO I DO ABOUT MY DIABETES MEDICATION?   Marland Kitchen Do not take oral diabetes medicines (pills) the morning of surgery.  . THE NIGHT BEFORE SURGERY, take 15  units of Glargine insulin.       . THE MORNING OF SURGERY, take 20 units of Glargine insulin.  . The day of surgery, do not take other diabetes injectables, including Byetta (exenatide), Bydureon (exenatide ER), Victoza (liraglutide), or Trulicity (dulaglutide).  . If your CBG is greater than 220 mg/dL, you may take  of your sliding scale (correction) dose of insulin.  Other Instructions:          Patient Signature:  Date:   Nurse Signature:  Date:   Reviewed and Endorsed by [facility] Patient Education Committee, August 2015  Do not wear jewelry, make-up or nail polish.  Do not wear lotions, powders, or perfumes, or deodorant.  Do not shave 48 hours prior to surgery.  Men may shave face and neck.  Do not bring valuables to the hospital.  [facility] is not responsible for any belongings or valuables.  Contacts, dentures or bridgework may not be worn into surgery.  Leave your suitcase in the car.  After surgery it may be brought to your room.  For patients admitted to the hospital,  discharge time will be determined by your treatment team.  Patients discharged the day of surgery will not be allowed to drive home.   Special instructions:  [place] - Preparing for Surgery  Before surgery, you can play an important role.  Because skin is not sterile, your skin needs to be as free of germs as possible.  You can reduce the number of germs on you skin by washing with CHG (chlorahexidine gluconate) soap before surgery.  CHG is an antiseptic cleaner which kills germs and bonds with the skin to continue killing germs even after washing.  Please DO NOT use if you have an allergy to CHG or antibacterial soaps.  If your skin becomes reddened/irritated stop using the CHG and inform your nurse when you arrive at Short Stay.  Do not shave (including legs and underarms) for at least 48 hours prior to the first CHG shower.  You may shave your face.  Please follow these instructions carefully:   1.  Shower with CHG Soap the night before surgery and the  morning of Surgery.  2.  If you choose to wash your hair, wash your hair first as usual with your normal shampoo.  3.  After you shampoo, rinse your hair and body thoroughly to remove the Shampoo.  4.  Use CHG as you would any other liquid soap.  You can apply chg directly to the skin and wash gently with scrungie or a clean washcloth.  5.  Apply the CHG Soap to your body ONLY FROM THE NECK DOWN.   Do not use on open wounds or open sores.  Avoid contact with your eyes,  ears, mouth and genitals (private parts).  Wash genitals (private parts)       with your normal soap.  6.  Wash thoroughly, paying special attention to the area where your surgery will be performed.  7.  Thoroughly rinse your body with warm water from the neck down.  8.  DO NOT shower/wash with your normal soap after using and rinsing off the CHG Soap.  9.  Pat yourself dry with a clean towel.            10.  Wear clean pajamas.            11.  Place clean sheets on your  bed the night of your first shower and do not sleep with pets.  Day of Surgery  Do not apply any lotions/deoderants the morning of surgery.  Please wear clean clothes to the hospital/surgery center.    Please read over the following fact sheets that you were given. Pain Booklet, Coughing and Deep Breathing and Surgical Site Infection Prevention

## 2017-07-04 NOTE — Progress Notes (Addendum)
PCP is Dr. Rory Percy- he also manages his Dm Denies being under the care of a cardiologist. Saw Dr Hyman Hopes  Years ago States he had an echo and stress test many years ago (greater than 10 years ago) Denies ever having a card cath. Reports his fasting CBGs run 120's -130's Today cbg was 249 states he has not taken his insulin today- he was in a hurry and forgot. Denies chest pain, cough, or fever.  States he has been diagnosed with sleep apnea in the past, but no longer wears his CPAP. Requested EKG from Dr Rory Percy

## 2017-07-05 NOTE — Progress Notes (Signed)
Anesthesia Chart Review:  Pt is a 57 year old male scheduled for L shoulder arthroscopy, subacromial decompression, mini open rotator cuff repair on 07/07/2017 with Susa Day, MD  - PCP is Rory Percy, MD  PMH includes: DM, OSA.  Former smoker (quit 06/29/17).  BMI 27.  Medications include: Symbicort, basaglar, sildenafil  BP 138/74   Pulse 61   Temp 36.6 C   Resp 20   Ht 5\' 11"  (1.803 m)   Wt 194 lb 3.2 oz (88.1 kg)   SpO2 98%   BMI 27.09 kg/m   Preoperative labs reviewed.   - HbA1c 8.5, glucose 254. I left voicemail for [facility] in Dr. Reather Littler office about uncontrolled DM.   EKG 07/04/17: Sinus bradycardia (58 bpm). Inferior infarct, age undetermined.  Inferior infarct not present on prior EKG 01/26/05, but is present on EKG 07/31/00.  If glucose acceptable day of surgery, I anticipate pt can proceed with surgery as scheduled.   Willeen Cass, FNP-BC [facility]/Anesthesiology Phone: [phone] 07/05/2017 2:44 PM

## 2017-07-07 ENCOUNTER — Ambulatory Visit (HOSPITAL_COMMUNITY): Payer: 59 | Admitting: Emergency Medicine

## 2017-07-07 ENCOUNTER — Other Ambulatory Visit: Payer: Self-pay

## 2017-07-07 ENCOUNTER — Encounter (HOSPITAL_COMMUNITY)
Admission: RE | Disposition: A | Discharge status: Home / Self Care | Payer: Self-pay | Source: Ambulatory Visit | Attending: Specialist

## 2017-07-07 ENCOUNTER — Ambulatory Visit (HOSPITAL_COMMUNITY): Payer: 59 | Admitting: Certified Registered"

## 2017-07-07 ENCOUNTER — Encounter (HOSPITAL_COMMUNITY): Payer: Self-pay | Admitting: Surgery

## 2017-07-07 ENCOUNTER — Ambulatory Visit (HOSPITAL_COMMUNITY)
Admission: RE | Admit: 2017-07-07 | Discharge: 2017-07-07 | Disposition: A | Discharge status: Home / Self Care | Payer: 59 | Source: Ambulatory Visit | Attending: Specialist | Admitting: Specialist

## 2017-07-07 DIAGNOSIS — E119 Type 2 diabetes mellitus without complications: Secondary | ICD-10-CM | POA: Insufficient documentation

## 2017-07-07 DIAGNOSIS — G4730 Sleep apnea, unspecified: Secondary | ICD-10-CM | POA: Insufficient documentation

## 2017-07-07 DIAGNOSIS — M19012 Primary osteoarthritis, left shoulder: Secondary | ICD-10-CM | POA: Diagnosis not present

## 2017-07-07 DIAGNOSIS — M75102 Unspecified rotator cuff tear or rupture of left shoulder, not specified as traumatic: Secondary | ICD-10-CM | POA: Diagnosis not present

## 2017-07-07 DIAGNOSIS — M75122 Complete rotator cuff tear or rupture of left shoulder, not specified as traumatic: Secondary | ICD-10-CM | POA: Insufficient documentation

## 2017-07-07 DIAGNOSIS — M1990 Unspecified osteoarthritis, unspecified site: Secondary | ICD-10-CM | POA: Insufficient documentation

## 2017-07-07 DIAGNOSIS — M7542 Impingement syndrome of left shoulder: Secondary | ICD-10-CM | POA: Insufficient documentation

## 2017-07-07 DIAGNOSIS — Z8249 Family history of ischemic heart disease and other diseases of the circulatory system: Secondary | ICD-10-CM | POA: Insufficient documentation

## 2017-07-07 DIAGNOSIS — Z794 Long term (current) use of insulin: Secondary | ICD-10-CM | POA: Insufficient documentation

## 2017-07-07 DIAGNOSIS — Z87891 Personal history of nicotine dependence: Secondary | ICD-10-CM | POA: Insufficient documentation

## 2017-07-07 DIAGNOSIS — G8918 Other acute postprocedural pain: Secondary | ICD-10-CM | POA: Diagnosis not present

## 2017-07-07 DIAGNOSIS — Z79899 Other long term (current) drug therapy: Secondary | ICD-10-CM | POA: Insufficient documentation

## 2017-07-07 HISTORY — PX: SHOULDER ARTHROSCOPY WITH ROTATOR CUFF REPAIR AND SUBACROMIAL DECOMPRESSION: SHX5686

## 2017-07-07 LAB — GLUCOSE, CAPILLARY
GLUCOSE-CAPILLARY: 97 mg/dL (ref 65–99)
Glucose-Capillary: 112 mg/dL — ABNORMAL HIGH (ref 65–99)

## 2017-07-07 SURGERY — SHOULDER ARTHROSCOPY WITH ROTATOR CUFF REPAIR AND SUBACROMIAL DECOMPRESSION
Anesthesia: General | Site: Shoulder | Laterality: Left

## 2017-07-07 MED ORDER — KETAMINE HCL 10 MG/ML IJ SOLN
INTRAMUSCULAR | Status: AC
  Filled 2017-07-07: qty 1

## 2017-07-07 MED ORDER — FENTANYL CITRATE (PF) 250 MCG/5ML IJ SOLN
INTRAMUSCULAR | Status: AC
  Filled 2017-07-07: qty 5

## 2017-07-07 MED ORDER — KETAMINE HCL 10 MG/ML IJ SOLN: INTRAMUSCULAR | Status: DC | PRN

## 2017-07-07 MED ORDER — OXYCODONE HCL 5 MG PO TABS: 5.0000 mg | ORAL_TABLET | ORAL | 0 refills | Status: DC | PRN

## 2017-07-07 MED ORDER — KETAMINE HCL 10 MG/ML IJ SOLN
INTRAMUSCULAR | Status: DC | PRN
  Administered 2017-07-07: 20 mg via INTRAVENOUS
  Administered 2017-07-07: 10 mg via INTRAVENOUS

## 2017-07-07 MED ORDER — HYDROMORPHONE HCL 1 MG/ML IJ SOLN: 0.2500 mg | INTRAMUSCULAR | Status: DC | PRN

## 2017-07-07 MED ORDER — ROCURONIUM BROMIDE 100 MG/10ML IV SOLN
INTRAVENOUS | Status: DC | PRN
  Administered 2017-07-07: 60 mg via INTRAVENOUS

## 2017-07-07 MED ORDER — FENTANYL CITRATE (PF) 100 MCG/2ML IJ SOLN
100.0000 ug | Freq: Once | INTRAMUSCULAR | Status: AC
  Administered 2017-07-07: 100 ug via INTRAVENOUS

## 2017-07-07 MED ORDER — MIDAZOLAM HCL 2 MG/2ML IJ SOLN
INTRAMUSCULAR | Status: AC
  Administered 2017-07-07: 2 mg via INTRAVENOUS
  Filled 2017-07-07: qty 2

## 2017-07-07 MED ORDER — BUPIVACAINE-EPINEPHRINE 0.5% -1:200000 IJ SOLN
INTRAMUSCULAR | Status: DC | PRN
  Administered 2017-07-07: 8 mL

## 2017-07-07 MED ORDER — ESMOLOL HCL 100 MG/10ML IV SOLN
INTRAVENOUS | Status: DC | PRN
  Administered 2017-07-07 (×3): 20 mg via INTRAVENOUS

## 2017-07-07 MED ORDER — METHOCARBAMOL 500 MG PO TABS: 500.0000 mg | ORAL_TABLET | Freq: Four times a day (QID) | ORAL | 1 refills | Status: DC | PRN

## 2017-07-07 MED ORDER — GLYCOPYRROLATE 0.2 MG/ML IJ SOLN
INTRAMUSCULAR | Status: DC | PRN
  Administered 2017-07-07: 0.2 mg via INTRAVENOUS

## 2017-07-07 MED ORDER — DEXAMETHASONE SODIUM PHOSPHATE 10 MG/ML IJ SOLN: INTRAMUSCULAR | Status: DC | PRN

## 2017-07-07 MED ORDER — PHENYLEPHRINE HCL 10 MG/ML IJ SOLN
INTRAVENOUS | Status: DC | PRN
  Administered 2017-07-07: 20 ug/min via INTRAVENOUS
  Administered 2017-07-07: 50 ug/min via INTRAVENOUS
  Administered 2017-07-07: 10 ug/min via INTRAVENOUS

## 2017-07-07 MED ORDER — 0.9 % SODIUM CHLORIDE (POUR BTL) OPTIME
TOPICAL | Status: DC | PRN
Start: 2017-07-07 — End: 2017-07-07
  Administered 2017-07-07: 1000 mL

## 2017-07-07 MED ORDER — MEPERIDINE HCL 50 MG/ML IJ SOLN: 6.2500 mg | INTRAMUSCULAR | Status: DC | PRN

## 2017-07-07 MED ORDER — EPINEPHRINE PF 1 MG/ML IJ SOLN
INTRAMUSCULAR | Status: AC
  Filled 2017-07-07: qty 3

## 2017-07-07 MED ORDER — ROPIVACAINE HCL 7.5 MG/ML IJ SOLN
INTRAMUSCULAR | Status: DC | PRN
  Administered 2017-07-07 (×4): 5 mL via PERINEURAL

## 2017-07-07 MED ORDER — FENTANYL CITRATE (PF) 100 MCG/2ML IJ SOLN
INTRAMUSCULAR | Status: AC
  Administered 2017-07-07: 100 ug via INTRAVENOUS
  Filled 2017-07-07: qty 2

## 2017-07-07 MED ORDER — CEFAZOLIN SODIUM-DEXTROSE 2-4 GM/100ML-% IV SOLN
2.0000 g | INTRAVENOUS | Status: AC
  Administered 2017-07-07: 2 g via INTRAVENOUS
  Filled 2017-07-07: qty 100

## 2017-07-07 MED ORDER — CEPHALEXIN 500 MG PO CAPS: 500.0000 mg | ORAL_CAPSULE | Freq: Four times a day (QID) | ORAL | 0 refills | Status: AC

## 2017-07-07 MED ORDER — LACTATED RINGERS IV SOLN
INTRAVENOUS | Status: DC
  Administered 2017-07-07: 11:00:00 via INTRAVENOUS

## 2017-07-07 MED ORDER — CHLORHEXIDINE GLUCONATE 4 % EX LIQD: 60.0000 mL | Freq: Once | CUTANEOUS | Status: DC

## 2017-07-07 MED ORDER — BUPIVACAINE-EPINEPHRINE (PF) 0.5% -1:200000 IJ SOLN
INTRAMUSCULAR | Status: AC
  Filled 2017-07-07: qty 30

## 2017-07-07 MED ORDER — DOCUSATE SODIUM 100 MG PO CAPS: 100.0000 mg | ORAL_CAPSULE | Freq: Two times a day (BID) | ORAL | 1 refills | Status: DC | PRN

## 2017-07-07 MED ORDER — PROPOFOL 10 MG/ML IV BOLUS
INTRAVENOUS | Status: AC
  Filled 2017-07-07: qty 20

## 2017-07-07 MED ORDER — LIDOCAINE HCL (CARDIAC) 20 MG/ML IV SOLN
INTRAVENOUS | Status: DC | PRN
  Administered 2017-07-07: 100 mg via INTRAVENOUS

## 2017-07-07 MED ORDER — PROMETHAZINE HCL 25 MG/ML IJ SOLN: 6.2500 mg | INTRAMUSCULAR | Status: DC | PRN

## 2017-07-07 MED ORDER — EPHEDRINE SULFATE 50 MG/ML IJ SOLN
INTRAMUSCULAR | Status: DC | PRN
  Administered 2017-07-07 (×2): 10 mg via INTRAVENOUS

## 2017-07-07 MED ORDER — SODIUM CHLORIDE 0.9 % IR SOLN
Status: DC | PRN
  Administered 2017-07-07: 3000 mL

## 2017-07-07 MED ORDER — MIDAZOLAM HCL 2 MG/2ML IJ SOLN
INTRAMUSCULAR | Status: AC
  Filled 2017-07-07: qty 2

## 2017-07-07 MED ORDER — MIDAZOLAM HCL 2 MG/2ML IJ SOLN
2.0000 mg | Freq: Once | INTRAMUSCULAR | Status: AC
  Administered 2017-07-07: 2 mg via INTRAVENOUS

## 2017-07-07 MED ORDER — SUGAMMADEX SODIUM 200 MG/2ML IV SOLN
INTRAVENOUS | Status: DC | PRN
  Administered 2017-07-07: 200 mg via INTRAVENOUS

## 2017-07-07 MED ORDER — EPINEPHRINE PF 1 MG/ML IJ SOLN
INTRAMUSCULAR | Status: DC | PRN
  Administered 2017-07-07: 1 mg

## 2017-07-07 MED ORDER — KETOROLAC TROMETHAMINE 30 MG/ML IJ SOLN: 30.0000 mg | Freq: Once | INTRAMUSCULAR | Status: DC | PRN

## 2017-07-07 MED ORDER — PROPOFOL 10 MG/ML IV BOLUS
INTRAVENOUS | Status: DC | PRN
  Administered 2017-07-07: 150 mg via INTRAVENOUS
  Administered 2017-07-07: 50 mg via INTRAVENOUS

## 2017-07-07 MED ORDER — MIDAZOLAM HCL 5 MG/5ML IJ SOLN
INTRAMUSCULAR | Status: DC | PRN
  Administered 2017-07-07 (×2): 1 mg via INTRAVENOUS

## 2017-07-07 MED ORDER — ONDANSETRON HCL 4 MG/2ML IJ SOLN
INTRAMUSCULAR | Status: DC | PRN
  Administered 2017-07-07: 4 mg via INTRAVENOUS

## 2017-07-07 SURGICAL SUPPLY — 62 items
ANCHOR NEEDLE 9/16 CIR SZ 8 (NEEDLE) IMPLANT
ANCHOR SUT BIOCOMP CORKSREW (Anchor) ×3 IMPLANT
BLADE CUDA 4.2 (BLADE) ×3 IMPLANT
BLADE CUDA SHAVER 3.5 (BLADE) ×3 IMPLANT
BLADE OSCILLATING/SAGITTAL (BLADE) ×2
BLADE SURG SZ11 CARB STEEL (BLADE) ×3 IMPLANT
BLADE SW THK.38XMED LNG THN (BLADE) ×1 IMPLANT
BUR OVAL 4.0 (BURR) IMPLANT
CANNULA ACUFLEX KIT 5X76 (CANNULA) ×3 IMPLANT
CANNULA ACUFO 5X76 (CANNULA) ×3 IMPLANT
CHLORAPREP W/TINT 26ML (MISCELLANEOUS) IMPLANT
CLOSURE WOUND 1/2 X4 (GAUZE/BANDAGES/DRESSINGS) ×1
CLOTH 2% CHLOROHEXIDINE 3PK (PERSONAL CARE ITEMS) ×3 IMPLANT
COVER SURGICAL LIGHT HANDLE (MISCELLANEOUS) ×3 IMPLANT
DRAPE ORTHO SPLIT 77X108 STRL (DRAPES)
DRAPE POUCH INSTRU U-SHP 10X18 (DRAPES) ×3 IMPLANT
DRAPE STERI 35X30 U-POUCH (DRAPES) ×3 IMPLANT
DRAPE SURG ORHT 6 SPLT 77X108 (DRAPES) IMPLANT
DRSG AQUACEL AG ADV 3.5X 4 (GAUZE/BANDAGES/DRESSINGS) IMPLANT
DRSG AQUACEL AG ADV 3.5X 6 (GAUZE/BANDAGES/DRESSINGS) ×3 IMPLANT
DRSG PAD ABDOMINAL 8X10 ST (GAUZE/BANDAGES/DRESSINGS) IMPLANT
DURAPREP 26ML APPLICATOR (WOUND CARE) ×3 IMPLANT
ELECT NEEDLE TIP 2.8 STRL (NEEDLE) ×6 IMPLANT
ELECT REM PT RETURN 15FT ADLT (MISCELLANEOUS) ×3 IMPLANT
GLOVE BIOGEL PI IND STRL 7.0 (GLOVE) ×2 IMPLANT
GLOVE BIOGEL PI INDICATOR 7.0 (GLOVE) ×4
GLOVE ECLIPSE 7.0 STRL STRAW (GLOVE) ×3 IMPLANT
GLOVE SURG SS PI 7.0 STRL IVOR (GLOVE) ×6 IMPLANT
GLOVE SURG SS PI 7.5 STRL IVOR (GLOVE) ×3 IMPLANT
GLOVE SURG SS PI 8.0 STRL IVOR (GLOVE) ×6 IMPLANT
GOWN STRL REUS W/TWL XL LVL3 (GOWN DISPOSABLE) ×6 IMPLANT
KIT BASIN OR (CUSTOM PROCEDURE TRAY) ×3 IMPLANT
KIT POSITION SHOULDER SCHLEI (MISCELLANEOUS) ×3 IMPLANT
MANIFOLD NEPTUNE II (INSTRUMENTS) ×3 IMPLANT
NEEDLE SCORPION MULTI FIRE (NEEDLE) ×3 IMPLANT
NEEDLE SPNL 18GX3.5 QUINCKE PK (NEEDLE) ×3 IMPLANT
PACK SHOULDER (CUSTOM PROCEDURE TRAY) ×3 IMPLANT
POSITIONER SURGICAL ARM (MISCELLANEOUS) ×3 IMPLANT
PROBE BIPOLAR ATHRO 135MM 90D (MISCELLANEOUS) ×3 IMPLANT
SLING ARM IMMOBILIZER LRG (SOFTGOODS) ×3 IMPLANT
SLING ARM IMMOBILIZER MED (SOFTGOODS) IMPLANT
STRIP CLOSURE SKIN 1/2X4 (GAUZE/BANDAGES/DRESSINGS) ×2 IMPLANT
SUCTION FRAZIER HANDLE 10FR (MISCELLANEOUS) ×2
SUCTION TUBE FRAZIER 10FR DISP (MISCELLANEOUS) ×1 IMPLANT
SUT ETHIBOND NAB CT1 #1 30IN (SUTURE) IMPLANT
SUT ETHILON 4 0 PS 2 18 (SUTURE) ×3 IMPLANT
SUT FIBERWIRE #2 38 T-5 BLUE (SUTURE)
SUT PROLENE 3 0 PS 2 (SUTURE) ×3 IMPLANT
SUT TIGER TAPE 7 IN WHITE (SUTURE) IMPLANT
SUT VIC AB 1-0 CT2 27 (SUTURE) IMPLANT
SUT VIC AB 2-0 CT2 27 (SUTURE) ×3 IMPLANT
SUT VICRYL 0 UR6 27IN ABS (SUTURE) ×3 IMPLANT
SUTURE FIBERWR #2 38 T-5 BLUE (SUTURE) IMPLANT
TAPE FIBER 2MM 7IN #2 BLUE (SUTURE) IMPLANT
TOWEL OR 17X26 10 PK STRL BLUE (TOWEL DISPOSABLE) ×3 IMPLANT
TOWEL OR NON WOVEN STRL DISP B (DISPOSABLE) IMPLANT
TUBE CONNECTING 12'X1/4 (SUCTIONS) ×1
TUBE CONNECTING 12X1/4 (SUCTIONS) ×2 IMPLANT
TUBING ARTHRO INFLOW-ONLY STRL (TUBING) ×3 IMPLANT
TUBING CONNECTING 10 (TUBING) ×2 IMPLANT
TUBING CONNECTING 10' (TUBING) ×1
WAND HAND CNTRL MULTIVAC 90 (MISCELLANEOUS) IMPLANT

## 2017-07-07 NOTE — Brief Op Note (Signed)
07/07/2017  2:34 PM  PATIENT:  Miguel [name]  57 y.o. male  PRE-OPERATIVE DIAGNOSIS:  Rotator cuff tear left shoulder  POST-OPERATIVE DIAGNOSIS:  Rotator cuff tear left shoulder  PROCEDURE:  Procedure(s): Left shoulder arthroscopy, subacromial decompression, mini open rotator cuff repair (Left)  SURGEON:  Surgeon(s) and Role:    Susa Day, MD - Primary  PHYSICIAN ASSISTANT:   ASSISTANTS: [place]   ANESTHESIA:   general  EBL:  min   BLOOD ADMINISTERED:none  DRAINS: none   LOCAL MEDICATIONS USED:  MARCAINE     SPECIMEN:  No Specimen  DISPOSITION OF SPECIMEN:  N/A  COUNTS:  YES  TOURNIQUET:  * No tourniquets in log *  DICTATION: .Other Dictation: Dictation Number [id number]  PLAN OF CARE: Discharge to home after PACU  PATIENT DISPOSITION:  PACU - hemodynamically stable.   Delay start of Pharmacological VTE agent (>24hrs) due to surgical blood loss or risk of bleeding: no

## 2017-07-07 NOTE — Anesthesia Preprocedure Evaluation (Signed)
Anesthesia Evaluation  Patient identified by MRN, date of birth, ID band Patient awake    Reviewed: Allergy & Precautions, NPO status , Patient's Chart, lab work & pertinent test results  Airway Mallampati: I       Dental  (+) Teeth Intact   Pulmonary sleep apnea , former smoker,    Pulmonary exam normal breath sounds clear to auscultation       Cardiovascular negative cardio ROS Normal cardiovascular exam Rhythm:Regular Rate:Normal     Neuro/Psych negative neurological ROS     GI/Hepatic negative GI ROS, Neg liver ROS,   Endo/Other  diabetes, Type 2, Insulin Dependent  Renal/GU negative Renal ROS     Musculoskeletal  (+) Arthritis , Osteoarthritis,    Abdominal Normal abdominal exam  (+)   Peds  Hematology negative hematology ROS (+)   Anesthesia Other Findings   Reproductive/Obstetrics                             Anesthesia Physical Anesthesia Plan  ASA: II  Anesthesia Plan: General   Post-op Pain Management:  Regional for Post-op pain   Induction: Intravenous  PONV Risk Score and Plan: 2 and Ondansetron and Dexamethasone  Airway Management Planned: Oral ETT  Additional Equipment:   Intra-op Plan:   Post-operative Plan: Extubation in OR  Informed Consent: I have reviewed the patients History and Physical, chart, labs and discussed the procedure including the risks, benefits and alternatives for the proposed anesthesia with the patient or authorized representative who has indicated his/her understanding and acceptance.   Dental advisory given  Plan Discussed with: CRNA and Surgeon  Anesthesia Plan Comments:         Anesthesia Quick Evaluation

## 2017-07-07 NOTE — Anesthesia Procedure Notes (Signed)
Anesthesia Regional Block: Interscalene brachial plexus block   Pre-Anesthetic Checklist: ,, timeout performed, Correct Patient, Correct Site, Correct Laterality, Correct Procedure, Correct Position, site marked, Risks and benefits discussed,  Surgical consent,  Pre-op evaluation,  At surgeon's request and post-op pain management  Laterality: Left and Upper  Prep: chloraprep       Needles:  Injection technique: Single-shot  Needle Type: Echogenic Stimulator Needle     Needle Length: 10cm  Needle Gauge: 21   Needle insertion depth: 2 cm   Additional Needles:   Procedures:,,,, ultrasound used (permanent image in chart),,,,  Narrative:  Start time: 07/07/2017 12:15 PM End time: 07/07/2017 12:26 PM Injection made incrementally with aspirations every 5 mL.  Performed by: Personally  Anesthesiologist: Lyn Hollingshead, MD

## 2017-07-07 NOTE — Transfer of Care (Signed)
Immediate Anesthesia Transfer of Care Note  Patient: Miguel [name]  Procedure(s) Performed: Left shoulder arthroscopy, subacromial decompression, mini open rotator cuff repair (Left Shoulder)  Patient Location: PACU  Anesthesia Type:GA combined with regional for post-op pain  Level of Consciousness: awake, alert , oriented and patient cooperative  Airway & Oxygen Therapy: Patient Spontanous Breathing and Patient connected to face mask oxygen  Post-op Assessment: Report given to RN and Post -op Vital signs reviewed and stable  Post vital signs: Reviewed and stable  Last Vitals:  Vitals Value Taken Time  BP    Temp    Pulse    Resp    SpO2      Last Pain:  Vitals:   07/07/17 1143  TempSrc:   PainSc: 6       Patients Stated Pain Goal: 3 (67/89/38 1017)  Complications: No apparent anesthesia complications

## 2017-07-07 NOTE — Discharge Instructions (Signed)
Aquacel dressing may remain in place until follow up. May shower with aquacel dressing in place. If the dressing becomes saturated or peels off, you may remove it and place a new dressing with gauze and tape which should be kept clean and dry and changed daily. Use sling at times except when exercising or showering No driving for 4-6 weeks No lifting for 6 weeks operative arm Pendulum exercises as instructed. Ok to move wrist, elbow, and hand. See Dr. Rya Rausch in 10-14 days. Take one aspirin per day with a meal if not on a blood thinner or allergic to aspirin.Aquacel dressing may remain in place until follow up. May shower with aquacel dressing in place. If the dressing becomes saturated or peels off, you may remove it and place a new dressing with gauze and tape which should be kept clean and dry and changed daily. Use sling at times except when exercising or showering No driving for 4-6 weeks No lifting for 6 weeks operative arm Pendulum exercises as instructed. Ok to move wrist, elbow, and hand. See Dr. Sanvika Cuttino in 10-14 days. Take one aspirin per day with a meal if not on a blood thinner or allergic to aspirin. 

## 2017-07-07 NOTE — Anesthesia Procedure Notes (Signed)
Procedure Name: Intubation Date/Time: 07/07/2017 1:18 PM Performed by: Adalberto Ill, CRNA Pre-anesthesia Checklist: Emergency Drugs available, Patient identified, Suction available, Patient being monitored and Timeout performed Patient Re-evaluated:Patient Re-evaluated prior to induction Oxygen Delivery Method: Circle system utilized Preoxygenation: Pre-oxygenation with 100% oxygen Induction Type: IV induction Ventilation: Mask ventilation without difficulty Laryngoscope Size: Miller and 2 Grade View: Grade I Tube type: Oral Tube size: 7.5 mm Number of attempts: 1 (brief atraumatic ) Airway Equipment and Method: Stylet Placement Confirmation: ETT inserted through vocal cords under direct vision,  positive ETCO2 and breath sounds checked- equal and bilateral Secured at: 23 cm Tube secured with: Tape Dental Injury: Teeth and Oropharynx as per pre-operative assessment

## 2017-07-07 NOTE — H&P (Signed)
Miguel [name] is an 58 y.o. male.   Chief Complaint: left shoulder pain HPI: rotator cuff tear left  Past Medical History:  Diagnosis Date  . Arthritis   . Diabetes mellitus without complication ([place])   . Sleep apnea     Past Surgical History:  Procedure Laterality Date  . BACK SURGERY    . COLONOSCOPY    . HAND SURGERY Right     Family History  Problem Relation Age of Onset  . AAA (abdominal aortic aneurysm) Father    Social History:  reports that he quit smoking 8 days ago. He has never used smokeless tobacco. He reports that he does not drink alcohol or use drugs.  Allergies: No Known Allergies  Medications Prior to Admission  Medication Sig Dispense Refill  . baclofen (LIORESAL) 10 MG tablet Take 10 mg by mouth 2 (two) times daily.    . budesonide-formoterol (SYMBICORT) 160-4.5 MCG/ACT inhaler Inhale 2 puffs into the lungs 2 (two) times daily.    Marland Kitchen gabapentin (NEURONTIN) 600 MG tablet Take 600 mg by mouth 3 (three) times daily.    . Insulin Glargine (BASAGLAR KWIKPEN) 100 UNIT/ML SOPN Inject 32-40 Units into the skin 2 (two) times daily. Inject 40 units in the morning & 32 units in the evening.    . naproxen sodium (ALEVE) 220 MG tablet Take 220 mg by mouth 2 (two) times daily as needed (for headaches.).    Marland Kitchen nicotine (NICODERM CQ - DOSED IN MG/24 HOURS) 21 mg/24hr patch Place 21 mg onto the skin daily.    . Oxycodone HCl 10 MG TABS Take 10 mg by mouth every 4 (four) hours as needed (for pain.).    Marland Kitchen sertraline (ZOLOFT) 100 MG tablet Take 100 mg by mouth at bedtime.    . sildenafil (REVATIO) 20 MG tablet Take 60 mg by mouth daily as needed (for erectile dysfunction.).    Marland Kitchen zolpidem (AMBIEN) 10 MG tablet Take 10 mg by mouth at bedtime.      Results for orders placed or performed during the hospital encounter of 07/07/17 (from the past 48 hour(s))  Glucose, capillary     Status: Abnormal   Collection Time: 07/07/17 11:09 AM  Result Value Ref Range   Glucose-Capillary  112 (H) 65 - 99 mg/dL   No results found.  Review of Systems  Musculoskeletal: Positive for joint pain.  All other systems reviewed and are negative.   Blood pressure (!) 159/80, pulse 64, temperature 98.3 F (36.8 C), temperature source Oral, resp. rate 18, height 5\' 11"  (1.803 m), weight 88 kg (194 lb), SpO2 98 %. Physical Exam  Constitutional: He is oriented to person, place, and time. He appears well-developed.  HENT:  Head: Normocephalic.  Eyes: Pupils are equal, round, and reactive to light.  Neck: Normal range of motion.  Cardiovascular: Normal rate.  Respiratory: Effort normal.  GI: Soft.  Musculoskeletal:  +impingemetn left . Neer sign + left.  Weak Er left numbness/tingling AC   Neurological: He is alert and oriented to person, place, and time.  Skin: Skin is warm and dry.  Psychiatric: He has a normal mood and affect.   MRI rotator cuff tear left full thickness  Assessment/Plan Rotator cuff tear left Plan LSA SAD Mini open RCR. Risks and benefits discussed.  Johnn Hai, MD 07/07/2017, 1:02 PM

## 2017-07-07 NOTE — Anesthesia Postprocedure Evaluation (Signed)
Anesthesia Post Note  Patient: Miguel [name]  Procedure(s) Performed: Left shoulder arthroscopy, subacromial decompression, mini open rotator cuff repair (Left Shoulder)     Patient location during evaluation: PACU Anesthesia Type: General Level of consciousness: awake and alert Pain management: pain level controlled Vital Signs Assessment: post-procedure vital signs reviewed and stable Respiratory status: spontaneous breathing, nonlabored ventilation and respiratory function stable Cardiovascular status: blood pressure returned to baseline and stable Postop Assessment: no apparent nausea or vomiting Anesthetic complications: no    Last Vitals:  Vitals:   07/07/17 1600 07/07/17 1615  BP:    Pulse: 70   Resp: 18   Temp:  36.8 C  SpO2: 100%     Last Pain:  Vitals:   07/07/17 1615  TempSrc:   PainSc: 0-No pain                 Lynda Rainwater

## 2017-07-10 ENCOUNTER — Encounter (HOSPITAL_COMMUNITY): Payer: Self-pay | Admitting: Specialist

## 2017-07-10 NOTE — Op Note (Signed)
NAMEDONIS, PINDER NO.:  [id number]  MEDICAL RECORD NO.:  [id number]  LOCATION:                                 FACILITY:  PHYSICIAN:  Susa Day, M.D.         DATE OF BIRTH:  DATE OF PROCEDURE:  07/07/2017 DATE OF DISCHARGE:                              OPERATIVE REPORT   PREOPERATIVE DIAGNOSIS:  Rotator cuff tear impingement syndrome of the left shoulder.  POSTOPERATIVE DIAGNOSIS:  Rotator cuff tear impingement syndrome of the left shoulder.  PROCEDURE PERFORMED: 1. Left shoulder arthroscopy. 2. Subacromial decompression, acromioplasty, bursectomy. 3. Mini open rotator cuff repair utilizing a corkscrew anchor.  ANESTHESIA:  General.  ASSISTANT:  Lacie Draft, PA.  HISTORY:  A 57 year old with severe pain in his left shoulder, refractory to conservative treatment, indicated for repair of the torn rotator cuff noted by MRI.  Risks and benefits were discussed including bleeding, infection, damage to the neurovascular structures, no changes in symptoms, worsening symptoms, DVT, PE, anesthetic complications, etc.  TECHNIQUE:  With the patient in supine beach-chair position after induction of adequate general anesthesia and a block of the left upper extremity, left shoulder was prepped and draped in usual sterile fashion.  Surgical marker was utilized to delineate the acromion, AC joint and coracoid.  Standard posterolateral portal was utilized with incision through the skin only with the arm in 70-30 position.  We advanced the cannula in the glenohumeral joint, penetrated the capsule atraumatically.  Irrigant at 65 mmHg was utilized to infiltrate the joint.  Examination revealed full-thickness tear of the rotator cuff, supraspinatus, biceps tendon, labrum was unremarkable.  Glenohumeral joint was unremarkable.  Subscap was unremarkable.  I therefore redirected the camera in the subacromial space, triangulating with a cannula from an anterolateral  portal, incised through the skin only. Hypertrophic bursa was noted.  We introduced the shaver and performed a bursectomy.  Hypertrophic CA ligament was noted.  Introduced an ArthroWand and dissected the CA ligament.  Full-thickness tear identified.  Converted to a mini open rotator cuff.  A small 2-cm incision over the anterolateral aspect of the acromion was made.  Subcutaneous tissue was dissected.  Electrocautery was utilized to achieve hemostasis.  The raphe between the anterolateral heads was identified, divided the line with skin incision.  Self-retaining retractor was placed.  We digitally lysed the adhesions.  Small spur off the acromion was removed with 3-mm Kerrison.  I debrided the supraspinatus, full-thickness tear was about a 0.5 x 0.5 cm, decorticated the footprint of the greater tuberosity.  I then used an awl to put a footprint for a corkscrew, this was with FiberTape.  I inserted a corkscrew with excellent resistance to pullout. I passed it into the anterior and posterior portions of the small tear with a Scorpion suture passer and then tied it down to the corkscrew bringing the leaflets down to the cancellous bed.  This was then oversewed with 0 Vicryl.  I copiously irrigated the wound.  The rest of the joint was unremarkable.  It was a watertight seal.  I then repaired the raphe with 1 Vicryl, subcu with 2-0, and skin with Prolene.  Sterile dressing applied.  Placed in the sling, extubated without difficulty and transported to the recovery room in satisfactory condition.  The patient tolerated the procedure well.  No complications.  Assistant, Lacie Draft, PA was used throughout the case for patient positioning, holding of the arm and closure.     Susa Day, M.D.   ______________________________ Susa Day, M.D.    Geralynn Rile  D:  07/07/2017  T:  07/07/2017  Job:  [id number]

## 2017-07-31 ENCOUNTER — Other Ambulatory Visit: Payer: Self-pay

## 2017-07-31 ENCOUNTER — Ambulatory Visit: Payer: 59 | Attending: Specialist | Admitting: Physical Therapy

## 2017-07-31 ENCOUNTER — Encounter: Payer: Self-pay | Admitting: Physical Therapy

## 2017-07-31 DIAGNOSIS — M25512 Pain in left shoulder: Secondary | ICD-10-CM | POA: Insufficient documentation

## 2017-07-31 DIAGNOSIS — G8929 Other chronic pain: Secondary | ICD-10-CM | POA: Insufficient documentation

## 2017-07-31 DIAGNOSIS — M25612 Stiffness of left shoulder, not elsewhere classified: Secondary | ICD-10-CM | POA: Insufficient documentation

## 2017-07-31 NOTE — Therapy (Signed)
[facility] [place], [place], [address] Phone: [phone]   Fax:  [phone]  Physical Therapy Evaluation  Patient Details  Name: Miguel [name] MRN: [id number] Date of Birth: [birth year] Referring Provider: Susa Day MD   Encounter Date: 07/31/2017  PT End of Session - 07/31/17 0954    Visit Number  1    Number of Visits  12    Date for PT Re-Evaluation  08/28/17    Authorization Type  FOTO AT LEAST EVERY 5TH VISIT.  10TH VISIT PROGRESS NOTE.    PT Start Time  [phone]    PT Stop Time  0940    PT Time Calculation (min)  41 min    Activity Tolerance  Patient tolerated treatment well    Behavior During Therapy  [facility] for tasks assessed/performed       Past Medical History:  Diagnosis Date  . Arthritis   . Diabetes mellitus without complication ([place])   . Sleep apnea     Past Surgical History:  Procedure Laterality Date  . BACK SURGERY    . COLONOSCOPY    . HAND SURGERY Right   . SHOULDER ARTHROSCOPY WITH ROTATOR CUFF REPAIR AND SUBACROMIAL DECOMPRESSION Left 07/07/2017   Procedure: Left shoulder arthroscopy, subacromial decompression, mini open rotator cuff repair;  Surgeon: Susa Day, MD;  Location: [facility];  Service: Orthopedics;  Laterality: Left;    There were no vitals filed for this visit.   Subjective Assessment - 07/31/17 1002    Subjective  The patient underwent a left shoulder SAD/DCR on 07/07/17.  He has been doing wall climbs at home and is pleased with his progress thus far.  His pain-level is 6/10 today and higher when moving his shoulder in certain ways.         [facility] PT Assessment - 07/31/17 0001      Assessment   Medical Diagnosis  Left shoulder SAD/DCR.    Referring Provider  Susa Day MD    Onset Date/Surgical Date  -- 07/07/17 (surgery date).      Precautions   Precautions  -- Follow protocol.      Restrictions   Weight Bearing Restrictions  No      Balance Screen   Has the  patient fallen in the past 6 months  No    Has the patient had a decrease in activity level because of a fear of falling?   No    Is the patient reluctant to leave their home because of a fear of falling?   No      Home Environment   Living Environment  Private residence      Prior Function   Level of Independence  Independent      Posture/Postural Control   Posture/Postural Control  Postural limitations    Postural Limitations  Rounded Shoulders;Forward head      ROM / Strength   AROM / PROM / Strength  AROM      AROM   Overall AROM Comments  In supine:  Left shoulder flexion= 135 degrees, IR to abdomen and ER to 45 degrees.      Palpation   Palpation comment  Tender to palpation over left anterior shoulder region though his CC is that of tenderness along the left scapular border and associated musculature attachment.      Ambulation/Gait   Gait Comments  WNL.                Objective measurements  completed on examination: See above findings.      [facility] Adult PT Treatment/Exercise - 07/31/17 0001      Modalities   Modalities  Electrical Stimulation;Vasopneumatic      Electrical Stimulation   Electrical Stimulation Location  Left shoulder/scapular region.    Electrical Stimulation Action  IFC    Electrical Stimulation Parameters  80-150 Hz x 15 minutes.    Electrical Stimulation Goals  Pain      Vasopneumatic   Number Minutes Vasopneumatic   15 minutes    Vasopnuematic Location   -- Left shoulder.    Vasopneumatic Pressure  Medium                  PT Long Term Goals - 07/31/17 1107      PT LONG TERM GOAL #1   Title  Ind with a HEP.    Time  4    Period  Weeks    Status  New      PT LONG TERM GOAL #2   Title  Active left shoulder flexion to 155 degrees so the patient can easily reach overhead.    Time  4    Period  Weeks    Status  New      PT LONG TERM GOAL #3   Title  Actve ER to 75 egrees+ to allow for easily donning/doffing of  apparel.    Time  4    Period  Weeks    Status  New      PT LONG TERM GOAL #4   Title  Increase right shoulder strength to a solid 4+/5 to increase stability for performance of functional activities.    Time  4    Period  Weeks    Status  New      PT LONG TERM GOAL #5   Title  Perform ADL's with pain not > 3/10.    Time  4    Period  Weeks    Status  New             Plan - 07/31/17 1020    Clinical Impression Statement  The patient presents to OPPT s/p left shoulder SAD/DCR performed on 07/07/17.  He is pleased with his progress thus far with an expected loss of range of motion.  He has continued referred pain into his left middle deltoid region and as loss of functional use of his left UE currently.  Patient will benefit from skilled physicla hterapy intervention.      Clinical Presentation  Stable    Clinical Presentation due to:  Good surgical outcome.    Clinical Decision Making  Low    Rehab Potential  Excellent    PT Frequency  3x / week    PT Duration  4 weeks    PT Treatment/Interventions  ADLs/Self Care Home Management;Cryotherapy;Electrical Stimulation;Ultrasound;Therapeutic activities;Therapeutic exercise;Patient/family education;Neuromuscular re-education;Manual techniques;Passive range of motion;Vasopneumatic Device    PT Next Visit Plan  P and AAROM; begin isometrics and then progress to AROM to include UE Ranger on wall and then to resistive exercises. e'stim and vasopneumatic.    Consulted and Agree with Plan of Care  Patient       Patient will benefit from skilled therapeutic intervention in order to improve the following deficits and impairments:  Decreased activity tolerance, Decreased range of motion, Pain  Visit Diagnosis: Chronic left shoulder pain - Plan: PT plan of care cert/re-cert  Stiffness of left shoulder, not elsewhere classified - Plan: PT  plan of care cert/re-cert     Problem List Patient Active Problem List   Diagnosis Date Noted  .  C O P D 11/21/2008  . CHEST PAIN-UNSPECIFIED 10/21/2008  . TOBACCO ABUSE 09/02/2008  . SHORTNESS OF BREATH 09/02/2008  . PULMONARY NODULE 01/31/2007  . COUGH 01/31/2007    Shogo Larkey, [place] MPT 07/31/2017, 11:11 AM  [facility] [place], [place], [address] Phone: [phone]   Fax:  [phone]  Name: Miguel [name] MRN: [id number] Date of Birth: 09-Jan-1961

## 2017-08-01 ENCOUNTER — Encounter: Payer: Self-pay | Admitting: Physical Therapy

## 2017-08-01 ENCOUNTER — Ambulatory Visit: Payer: 59 | Admitting: Physical Therapy

## 2017-08-01 DIAGNOSIS — M25512 Pain in left shoulder: Principal | ICD-10-CM

## 2017-08-01 DIAGNOSIS — M25612 Stiffness of left shoulder, not elsewhere classified: Secondary | ICD-10-CM

## 2017-08-01 DIAGNOSIS — G8929 Other chronic pain: Secondary | ICD-10-CM

## 2017-08-01 NOTE — Therapy (Signed)
[facility] [place], [place], [address] Phone: [phone]   Fax:  [phone]  Physical Therapy Treatment  Patient Details  Name: Miguel [name] MRN: [id number] Date of Birth: July 30, 1960 Referring Provider: Susa Day MD   Encounter Date: 08/01/2017  PT End of Session - 08/01/17 1037    Visit Number  2    Number of Visits  12    Date for PT Re-Evaluation  08/28/17    Authorization Type  FOTO AT LEAST EVERY 5TH VISIT.  10TH VISIT PROGRESS NOTE.    PT Start Time  1030    PT Stop Time  1121    PT Time Calculation (min)  51 min    Activity Tolerance  Patient tolerated treatment well    Behavior During Therapy  WFL for tasks assessed/performed       Past Medical History:  Diagnosis Date  . Arthritis   . Diabetes mellitus without complication ([place])   . Sleep apnea     Past Surgical History:  Procedure Laterality Date  . BACK SURGERY    . COLONOSCOPY    . HAND SURGERY Right   . SHOULDER ARTHROSCOPY WITH ROTATOR CUFF REPAIR AND SUBACROMIAL DECOMPRESSION Left 07/07/2017   Procedure: Left shoulder arthroscopy, subacromial decompression, mini open rotator cuff repair;  Surgeon: Susa Day, MD;  Location: [facility];  Service: Orthopedics;  Laterality: Left;    There were no vitals filed for this visit.  Subjective Assessment - 08/01/17 1037    Subjective  No new complaints.  I've been doing the wall climbs my doctor told me to do.  I doing the one you showed me too.    Currently in Pain?  Yes    Pain Score  6     Pain Location  Shoulder    Pain Orientation  Left    Pain Descriptors / Indicators  Aching;Throbbing                       [facility] Adult PT Treatment/Exercise - 08/01/17 0001      Exercises   Exercises  Shoulder      Shoulder Exercises: Pulleys   Other Pulley Exercises  UE Ranger x 5 minutes into flexion and circles (CW and CWW).      Shoulder Exercises: ROM/Strengthening   UBE  (Upper Arm Bike)  6 minutes in an active-assisitive fashion 3 minutes forward and 3 minutes backward.      Modalities   Modalities  Health visitor Stimulation Location  Left shoulder.    Electrical Stimulation Action  IFC    Electrical Stimulation Parameters  80-150 Hz x 15 minutes.    Electrical Stimulation Goals  Pain      Manual Therapy   Manual Therapy  Passive ROM;Other (comment)    Passive ROM  In supine:  Gentle PROM into left shoulder flexion, ER and IR.x 11 minutes.                  PT Long Term Goals - 08/01/17 1155      PT LONG TERM GOAL #3   Title  Actve ER to 75 degrees+ to allow for easily donning/doffing of apparel.            Plan - 08/01/17 1156    Clinical Impression Statement  The patient did an excellent job today.  He states he has been doing the wall climb  exercise per his surgeon's instruction and started the supine cane exercise to increase ER which was already notably improved.    PT Treatment/Interventions  ADLs/Self Care Home Management;Cryotherapy;Electrical Stimulation;Ultrasound;Therapeutic activities;Therapeutic exercise;Patient/family education;Neuromuscular re-education;Manual techniques;Passive range of motion;Vasopneumatic Device    PT Next Visit Plan  P and AAROM; begin isometrics and then progress to AROM to include UE Ranger on wall and then to resistive exercises. e'stim and vasopneumatic.    Consulted and Agree with Plan of Care  Patient       Patient will benefit from skilled therapeutic intervention in order to improve the following deficits and impairments:  Decreased activity tolerance, Decreased range of motion, Pain  Visit Diagnosis: Chronic left shoulder pain  Stiffness of left shoulder, not elsewhere classified     Problem List Patient Active Problem List   Diagnosis Date Noted  . C O P D 11/21/2008  . CHEST PAIN-UNSPECIFIED 10/21/2008  .  TOBACCO ABUSE 09/02/2008  . SHORTNESS OF BREATH 09/02/2008  . PULMONARY NODULE 01/31/2007  . COUGH 01/31/2007    Miguel [name], Miguel [name] 08/01/2017, 11:57 AM  [facility] [address] [place], [place], [address] Phone: [phone]   Fax:  [phone]  Name: Miguel [name] MRN: [id number] Date of Birth: [birth year]

## 2017-08-03 ENCOUNTER — Ambulatory Visit: Payer: 59 | Admitting: *Deleted

## 2017-08-03 ENCOUNTER — Encounter: Payer: Self-pay | Admitting: *Deleted

## 2017-08-03 DIAGNOSIS — M25512 Pain in left shoulder: Secondary | ICD-10-CM | POA: Diagnosis not present

## 2017-08-03 DIAGNOSIS — M25612 Stiffness of left shoulder, not elsewhere classified: Secondary | ICD-10-CM

## 2017-08-03 DIAGNOSIS — G8929 Other chronic pain: Secondary | ICD-10-CM

## 2017-08-03 NOTE — Therapy (Signed)
[facility] [place], [place], [address] Phone: [phone]   Fax:  [phone]  Physical Therapy Treatment  Patient Details  Name: Miguel [name] MRN: [id number] Date of Birth: Jun 24, 1960 Referring Provider: Susa Day MD   Encounter Date: 08/03/2017  PT End of Session - 08/03/17 0811    Visit Number  3    Number of Visits  12    Date for PT Re-Evaluation  08/28/17    Authorization Type  FOTO AT LEAST EVERY 5TH VISIT.  10TH VISIT PROGRESS NOTE.    PT Start Time  0815    PT Stop Time  0905    PT Time Calculation (min)  50 min       Past Medical History:  Diagnosis Date  . Arthritis   . Diabetes mellitus without complication ([place])   . Sleep apnea     Past Surgical History:  Procedure Laterality Date  . BACK SURGERY    . COLONOSCOPY    . HAND SURGERY Right   . SHOULDER ARTHROSCOPY WITH ROTATOR CUFF REPAIR AND SUBACROMIAL DECOMPRESSION Left 07/07/2017   Procedure: Left shoulder arthroscopy, subacromial decompression, mini open rotator cuff repair;  Surgeon: Susa Day, MD;  Location: [place];  Service: Orthopedics;  Laterality: Left;    There were no vitals filed for this visit.  Subjective Assessment - 08/03/17 0810    Subjective  No new complaints.  I've been doing the wall climbs my doctor told me to do.  I doing the one you showed me too.    Currently in Pain?  Yes    Pain Score  6     Pain Location  Shoulder    Pain Orientation  Left    Pain Descriptors / Indicators  Aching                       [facility] Adult PT Treatment/Exercise - 08/03/17 0001      Exercises   Exercises  Shoulder      Shoulder Exercises: Pulleys   Other Pulley Exercises  UE Ranger x 5 minutes into flexion and circles (CW and CWW).      Shoulder Exercises: ROM/Strengthening   UBE (Upper Arm Bike)  6 minutes at 120 RPMs in an active-assisitive fashion 3 minutes forward and 3 minutes backward.      Modalities   Modalities  Electrical Stimulation;Vasopneumatic;Moist Heat      Moist Heat Therapy   Number Minutes Moist Heat  15 Minutes    Moist Heat Location  Shoulder      Electrical Stimulation   Electrical Stimulation Location  Left shoulder. IFC x 80-150z x 15 mins    Electrical Stimulation Goals  Pain      Vasopneumatic   Vasopneumatic Temperature   not available      Manual Therapy   Manual Therapy  Passive ROM;Other (comment)    Passive ROM  In supine:  Gentle PROM into left shoulder flexion, ER and IR.x 11 minutes.                  PT Long Term Goals - 08/01/17 1155      PT LONG TERM GOAL #3   Title  Actve ER to 75 degrees+ to allow for easily donning/doffing of apparel.            Plan - 08/03/17 0811    Clinical Impression Statement  Pt arrived today doing fair. He continues to have pain  5-6/10 L shldr and into shldr blade, but reports it might be coming from his neck. He did well with AAROM exs, but had some discomfort in his RT shldr due to RCT in it. PROM  to LT shldr: flexion  150 degrees, ER 75 degrees. Pt is currently ahead of protocol and was advised to still not use LT UE to lift anything.    Clinical Presentation  Stable    Clinical Decision Making  Low    Rehab Potential  Excellent    PT Frequency  3x / week    PT Duration  4 weeks    PT Treatment/Interventions  ADLs/Self Care Home Management;Cryotherapy;Electrical Stimulation;Ultrasound;Therapeutic activities;Therapeutic exercise;Patient/family education;Neuromuscular re-education;Manual techniques;Passive range of motion;Vasopneumatic Device    PT Next Visit Plan  P and AAROM; begin isometrics and then progress to AROM to include UE Ranger on wall and then to resistive exercises. e'stim and vasopneumatic.    Consulted and Agree with Plan of Care  Patient       Patient will benefit from skilled therapeutic intervention in order to improve the following deficits and impairments:  Decreased activity  tolerance, Decreased range of motion, Pain  Visit Diagnosis: Chronic left shoulder pain  Stiffness of left shoulder, not elsewhere classified     Problem List Patient Active Problem List   Diagnosis Date Noted  . C O P D 11/21/2008  . CHEST PAIN-UNSPECIFIED 10/21/2008  . TOBACCO ABUSE 09/02/2008  . SHORTNESS OF BREATH 09/02/2008  . PULMONARY NODULE 01/31/2007  . COUGH 01/31/2007    [name],Miguel, PTA 08/03/2017, 9:54 AM  [facility] [address] [place], [place], [address] Phone: [phone]   Fax:  [phone]  Name: Miguel [name] MRN: [id number] Date of Birth: [birth year]

## 2017-08-07 ENCOUNTER — Ambulatory Visit: Payer: 59 | Admitting: Physical Therapy

## 2017-08-07 DIAGNOSIS — M25612 Stiffness of left shoulder, not elsewhere classified: Secondary | ICD-10-CM

## 2017-08-07 DIAGNOSIS — M25512 Pain in left shoulder: Principal | ICD-10-CM

## 2017-08-07 DIAGNOSIS — G8929 Other chronic pain: Secondary | ICD-10-CM

## 2017-08-07 NOTE — Therapy (Signed)
[facility] [place], [place], [address] Phone: [phone]   Fax:  [phone]  Physical Therapy Treatment  Patient Details  Name: Miguel [name] MRN: [id number] Date of Birth: 06/30/1960 Referring Provider: Susa Day MD   Encounter Date: 08/07/2017  PT End of Session - 08/07/17 0949    Visit Number  4    Number of Visits  12    Date for PT Re-Evaluation  08/28/17    Authorization Type  FOTO AT LEAST EVERY 5TH VISIT.  10TH VISIT PROGRESS NOTE.    PT Start Time  0900    PT Stop Time  1004    PT Time Calculation (min)  64 min       Past Medical History:  Diagnosis Date  . Arthritis   . Diabetes mellitus without complication ([place])   . Sleep apnea     Past Surgical History:  Procedure Laterality Date  . BACK SURGERY    . COLONOSCOPY    . HAND SURGERY Right   . SHOULDER ARTHROSCOPY WITH ROTATOR CUFF REPAIR AND SUBACROMIAL DECOMPRESSION Left 07/07/2017   Procedure: Left shoulder arthroscopy, subacromial decompression, mini open rotator cuff repair;  Surgeon: Susa Day, MD;  Location: [facility];  Service: Orthopedics;  Laterality: Left;    There were no vitals filed for this visit.  Subjective Assessment - 08/07/17 0909    Subjective  Patient states he may be overworking it some. 'I worked it right hard this weekend". He reports some pain at end range flexion.    Currently in Pain?  Yes    Pain Score  7     Pain Location  Shoulder    Pain Orientation  Left    Pain Descriptors / Indicators  Aching                       [facility] Adult PT Treatment/Exercise - 08/07/17 0001      Shoulder Exercises: Supine   Other Supine Exercises  thoracic extension over bolster x  1 min      Shoulder Exercises: Seated   Retraction  Strengthening;Both;20 reps      Shoulder Exercises: Pulleys   Other Pulley Exercises  UE Ranger x 5 minutes into flexion and circles (CW and CWW). painful today; pt advised to stay  in pain free range      Shoulder Exercises: ROM/Strengthening   UBE (Upper Arm Bike)  6 minutes at 120 RPMs in an active-assisitive fashion 3 minutes forward and 3 minutes backward.      Modalities   Modalities  Psychologist, educational Location  L shoulder and L cerv/thoracic paraspinals    Electrical Stimulation Action  premod    Electrical Stimulation Parameters  80-150 Hz x 1min    Electrical Stimulation Goals  Pain      Vasopneumatic   Number Minutes Vasopneumatic   15 minutes    Vasopnuematic Location   Shoulder    Vasopneumatic Pressure  Medium    Vasopneumatic Temperature   34      Manual Therapy   Manual Therapy  Soft tissue mobilization;Passive ROM;Joint mobilization    Joint Mobilization  upper and mid thoracic spine PA mobs Gd III    Soft tissue mobilization  IASTM to L low and mid traps, rhomboids, levator scapula and along scapular borders; STW to thoracic paraspinals L     Passive ROM  L shoulder into  flex, ER and IR x 5 min                  PT Long Term Goals - 08/01/17 1155      PT LONG TERM GOAL #3   Title  Actve ER to 75 degrees+ to allow for easily donning/doffing of apparel.            Plan - 08/07/17 1050    Clinical Impression Statement  Patient arrived today c/o increased pain which he attributed to overdoing it with his exercises. His pain is referred in to L deltoid and in L low traps. He was advised to decrease ROM to 2x/day and add in scapular retraction and thoracic extension. Patient responded very well to IASTM and manual therapy.     PT Treatment/Interventions  ADLs/Self Care Home Management;Cryotherapy;Electrical Stimulation;Ultrasound;Therapeutic activities;Therapeutic exercise;Patient/family education;Neuromuscular re-education;Manual techniques;Passive range of motion;Vasopneumatic Device    PT Next Visit Plan  P and AAROM; begin isometrics and then progress to  AROM to include UE Ranger on wall and then to resistive exercises. e'stim and vasopneumatic.       Patient will benefit from skilled therapeutic intervention in order to improve the following deficits and impairments:  Decreased activity tolerance, Decreased range of motion, Pain  Visit Diagnosis: Chronic left shoulder pain  Stiffness of left shoulder, not elsewhere classified     Problem List Patient Active Problem List   Diagnosis Date Noted  . C O P D 11/21/2008  . CHEST PAIN-UNSPECIFIED 10/21/2008  . TOBACCO ABUSE 09/02/2008  . SHORTNESS OF BREATH 09/02/2008  . PULMONARY NODULE 01/31/2007  . COUGH 01/31/2007    Madelyn Flavors PT 08/07/2017, 10:54 AM  [facility] [address] [place], [place], [address] Phone: [phone]   Fax:  [phone]  Name: Miguel [name] MRN: [id number] Date of Birth: 12-06-1960

## 2017-08-09 ENCOUNTER — Encounter: Payer: Self-pay | Admitting: Physical Therapy

## 2017-08-09 ENCOUNTER — Ambulatory Visit: Payer: 59 | Attending: Specialist | Admitting: Physical Therapy

## 2017-08-09 DIAGNOSIS — G8929 Other chronic pain: Secondary | ICD-10-CM | POA: Diagnosis not present

## 2017-08-09 DIAGNOSIS — M25512 Pain in left shoulder: Secondary | ICD-10-CM | POA: Insufficient documentation

## 2017-08-09 DIAGNOSIS — M25612 Stiffness of left shoulder, not elsewhere classified: Secondary | ICD-10-CM | POA: Insufficient documentation

## 2017-08-09 NOTE — Therapy (Signed)
[facility] [place], [place], [address] Phone: [phone]   Fax:  [phone]  Physical Therapy Treatment  Patient Details  Name: NOLLAN MULDROW MRN: [id number] Date of Birth: March 25, 1961 Referring Provider: Susa Day MD   Encounter Date: 08/09/2017  PT End of Session - 08/09/17 1001    Visit Number  5    Number of Visits  12    Date for PT Re-Evaluation  08/28/17    Authorization Type  FOTO AT LEAST EVERY 5TH VISIT.  10TH VISIT PROGRESS NOTE.    PT Start Time  0900    PT Stop Time  0947    PT Time Calculation (min)  47 min    Activity Tolerance  Patient tolerated treatment well    Behavior During Therapy  WFL for tasks assessed/performed       Past Medical History:  Diagnosis Date  . Arthritis   . Diabetes mellitus without complication ([place])   . Sleep apnea     Past Surgical History:  Procedure Laterality Date  . BACK SURGERY    . COLONOSCOPY    . HAND SURGERY Right   . SHOULDER ARTHROSCOPY WITH ROTATOR CUFF REPAIR AND SUBACROMIAL DECOMPRESSION Left 07/07/2017   Procedure: Left shoulder arthroscopy, subacromial decompression, mini open rotator cuff repair;  Surgeon: Susa Day, MD;  Location: [facility];  Service: Orthopedics;  Laterality: Left;    There were no vitals filed for this visit.  Subjective Assessment - 08/09/17 0913    Subjective  Pain about a 6/10 today.  I think some is coming from my neck though.  I've been using a deep maasager on my shoulder.    Pain Score  6     Pain Location  Shoulder    Pain Orientation  Left                       [facility] Adult PT Treatment/Exercise - 08/09/17 0001      Shoulder Exercises: Pulleys   Flexion  -- 4 minutes.      Shoulder Exercises: ROM/Strengthening   UBE (Upper Arm Bike)  6 minutes AAROM.    Ranger  On wall x 4 minutes into flexion and circles CCW and CW.      Modalities   Modalities  Electrical Stimulation;Vasopneumatic       Moist Heat Therapy   Number Minutes Moist Heat  20 Minutes    Moist Heat Location  -- Left shoulder.      Electrical Stimulation   Electrical Stimulation Location  Left medial scap border and left shoulder.    Electrical Stimulation Action  pre-mod to both regions at 80-150 Hz x 20 minutes.    Electrical Stimulation Goals  Pain      Vasopneumatic   Number Minutes Vasopneumatic   20 minutes    Vasopnuematic Location   -- Left shoulder.    Vasopneumatic Pressure  Medium Pillow between left elbow and thorax.      Manual Therapy   Passive ROM  In supine:  PROM x 5 minutes into flexion and ER and rhy stabs at 90 degrees.                  PT Long Term Goals - 08/01/17 1155      PT LONG TERM GOAL #3   Title  Actve ER to 75 degrees+ to allow for easily donning/doffing of apparel.  Plan - 08/09/17 1002    Clinical Impression Statement  The patient is doing very well.  He demonstrated left shoulder passive flexion to 150 degrees and full ER.    PT Treatment/Interventions  ADLs/Self Care Home Management;Cryotherapy;Electrical Stimulation;Ultrasound;Therapeutic activities;Therapeutic exercise;Patient/family education;Neuromuscular re-education;Manual techniques;Passive range of motion;Vasopneumatic Device    PT Next Visit Plan  P and AAROM; begin isometrics and then progress to AROM to include UE Ranger on wall and then to resistive exercises. e'stim and vasopneumatic.    Consulted and Agree with Plan of Care  Patient       Patient will benefit from skilled therapeutic intervention in order to improve the following deficits and impairments:  Decreased activity tolerance, Decreased range of motion, Pain  Visit Diagnosis: Chronic left shoulder pain  Stiffness of left shoulder, not elsewhere classified     Problem List Patient Active Problem List   Diagnosis Date Noted  . C O P D 11/21/2008  . CHEST PAIN-UNSPECIFIED 10/21/2008  . TOBACCO ABUSE 09/02/2008   . SHORTNESS OF BREATH 09/02/2008  . PULMONARY NODULE 01/31/2007  . COUGH 01/31/2007    Abrar Koone, [place] MPT 08/09/2017, 10:05 AM  [facility] [address] [place], [place], [address] Phone: [phone]   Fax:  [phone]  Name: JYDEN KROMER MRN: [id number] Date of Birth: 27-Jul-1960

## 2017-08-11 ENCOUNTER — Ambulatory Visit: Payer: 59 | Admitting: *Deleted

## 2017-08-11 DIAGNOSIS — G8929 Other chronic pain: Secondary | ICD-10-CM | POA: Diagnosis not present

## 2017-08-11 DIAGNOSIS — M25512 Pain in left shoulder: Secondary | ICD-10-CM | POA: Diagnosis not present

## 2017-08-11 DIAGNOSIS — M25612 Stiffness of left shoulder, not elsewhere classified: Secondary | ICD-10-CM

## 2017-08-11 NOTE — Therapy (Signed)
[facility] [place], [place], [address] Phone: [phone]   Fax:  [phone]  Physical Therapy Treatment  Patient Details  Name: GENERAL WEARING MRN: [id number] Date of Birth: Dec 02, 1960 Referring Provider: Susa Day MD   Encounter Date: 08/11/2017  PT End of Session - 08/11/17 0911    Visit Number  6    Number of Visits  12    Date for PT Re-Evaluation  08/28/17    Authorization Type  FOTO AT LEAST EVERY 5TH VISIT.  10TH VISIT PROGRESS NOTE.    PT Start Time  0900    PT Stop Time  0951    PT Time Calculation (min)  51 min    Activity Tolerance  Patient tolerated treatment well    Behavior During Therapy  WFL for tasks assessed/performed       Past Medical History:  Diagnosis Date  . Arthritis   . Diabetes mellitus without complication ([place])   . Sleep apnea     Past Surgical History:  Procedure Laterality Date  . BACK SURGERY    . COLONOSCOPY    . HAND SURGERY Right   . SHOULDER ARTHROSCOPY WITH ROTATOR CUFF REPAIR AND SUBACROMIAL DECOMPRESSION Left 07/07/2017   Procedure: Left shoulder arthroscopy, subacromial decompression, mini open rotator cuff repair;  Surgeon: Susa Day, MD;  Location: [facility];  Service: Orthopedics;  Laterality: Left;    There were no vitals filed for this visit.  Subjective Assessment - 08/11/17 0910    Subjective  Pain about a 6/10 today.  I think some is coming from my neck though.  I've been using a deep maasager on my shoulder.    Limitations  House hold activities    Currently in Pain?  Yes    Pain Score  6     Pain Location  Shoulder    Pain Orientation  Left    Pain Descriptors / Indicators  Aching    Pain Type  Surgical pain    Pain Onset  More than a month ago    Pain Frequency  Constant                       [facility] Adult PT Treatment/Exercise - 08/11/17 0001      Shoulder Exercises: Pulleys   Flexion  -- 4 minutes.    Other Pulley Exercises   UE Ranger x 5 minutes into flexion and circles (CW and CWW). painful today; pt advised to stay in pain free range      Shoulder Exercises: ROM/Strengthening   UBE (Upper Arm Bike)  6 minutes AAROM.      Modalities   Modalities  Electrical Stimulation;Vasopneumatic      Electrical Stimulation   Electrical Stimulation Location  Left medial scap border and left shoulder.    Electrical Stimulation Goals  Pain      Vasopneumatic   Number Minutes Vasopneumatic   20 minutes    Vasopnuematic Location   Shoulder    Vasopneumatic Pressure  Medium    Vasopneumatic Temperature   34      Manual Therapy   Soft tissue mobilization  IASTM to UT,L low and mid traps, rhomboids, levator scapula and along scapular borders; STW to thoracic paraspinals L     Passive ROM  In supine:  PROM x 5 minutes into flexion and ER and rhy stabs at 90 degrees.  PT Long Term Goals - 08/11/17 1054      PT LONG TERM GOAL #1   Title  Ind with a HEP.    Time  4    Period  Weeks    Status  On-going      PT LONG TERM GOAL #2   Title  Active left shoulder flexion to 155 degrees so the patient can easily reach overhead.    Time  4    Period  Weeks    Status  On-going      PT LONG TERM GOAL #3   Title  Actve ER to 75 degrees+ to allow for easily donning/doffing of apparel.    Time  4    Period  Weeks    Status  On-going      PT LONG TERM GOAL #4   Title  Increase right shoulder strength to a solid 4+/5 to increase stability for performance of functional activities.    Time  4    Period  Weeks    Status  On-going      PT LONG TERM GOAL #5   Title  Perform ADL's with pain not > 3/10.    Time  4    Period  Weeks    Status  On-going            Plan - 08/11/17 3614    Clinical Impression Statement  Pt arrived today still c/o tighness and pain LT upper arm, UT, and into shldr blade. He reports reaching into cabinet with LT UE and having a sharp pain go down his arm. Pt was  advised not to be using LT  UE due to possible injury to surgical site. Pt.'s ROM continues to do well at 150 degrees of flexion and full ER. He did well with AAROM exs, but had notable tightness and TP's in LT UT,levator , and into mid-trap with some release with STW. Normal response to modalities    Clinical Presentation  Stable    Clinical Decision Making  Low    Rehab Potential  Excellent    PT Frequency  3x / week    PT Duration  4 weeks    PT Treatment/Interventions  ADLs/Self Care Home Management;Cryotherapy;Electrical Stimulation;Ultrasound;Therapeutic activities;Therapeutic exercise;Patient/family education;Neuromuscular re-education;Manual techniques;Passive range of motion;Vasopneumatic Device    PT Next Visit Plan  P and AAROM; begin isometrics and then progress to AROM to include UE Ranger on wall and then to resistive exercises. e'stim and vasopneumatic.       Patient will benefit from skilled therapeutic intervention in order to improve the following deficits and impairments:  Decreased activity tolerance, Decreased range of motion, Pain  Visit Diagnosis: Chronic left shoulder pain  Stiffness of left shoulder, not elsewhere classified     Problem List Patient Active Problem List   Diagnosis Date Noted  . C O P D 11/21/2008  . CHEST PAIN-UNSPECIFIED 10/21/2008  . TOBACCO ABUSE 09/02/2008  . SHORTNESS OF BREATH 09/02/2008  . PULMONARY NODULE 01/31/2007  . COUGH 01/31/2007    RAMSEUR,CHRIS, PTA 08/11/2017, 10:55 AM  [facility] [place], [place], [address] Phone: [phone]   Fax:  [phone]  Name: WAYLYN TENBRINK MRN: [id number] Date of Birth: 08/18/1960

## 2017-08-14 ENCOUNTER — Ambulatory Visit: Payer: 59 | Admitting: Physical Therapy

## 2017-08-14 DIAGNOSIS — G8929 Other chronic pain: Secondary | ICD-10-CM

## 2017-08-14 DIAGNOSIS — M25612 Stiffness of left shoulder, not elsewhere classified: Secondary | ICD-10-CM | POA: Diagnosis not present

## 2017-08-14 DIAGNOSIS — M25512 Pain in left shoulder: Principal | ICD-10-CM

## 2017-08-14 NOTE — Therapy (Signed)
[facility] [place], [place], [address] Phone: [phone]   Fax:  [phone]  Physical Therapy Treatment  Patient Details  Name: Miguel [name] MRN: [id number] Date of Birth: 1960/05/18 Referring Provider: Susa Day MD   Encounter Date: 08/14/2017  PT End of Session - 08/14/17 1524    Visit Number  7    Number of Visits  12    Date for PT Re-Evaluation  08/28/17    Authorization Type  FOTO AT LEAST EVERY 5TH VISIT.  10TH VISIT PROGRESS NOTE.    PT Start Time  1515    PT Stop Time  1611    PT Time Calculation (min)  56 min    Activity Tolerance  Patient tolerated treatment well    Behavior During Therapy  WFL for tasks assessed/performed       Past Medical History:  Diagnosis Date  . Arthritis   . Diabetes mellitus without complication ([place])   . Sleep apnea     Past Surgical History:  Procedure Laterality Date  . BACK SURGERY    . COLONOSCOPY    . HAND SURGERY Right   . SHOULDER ARTHROSCOPY WITH ROTATOR CUFF REPAIR AND SUBACROMIAL DECOMPRESSION Left 07/07/2017   Procedure: Left shoulder arthroscopy, subacromial decompression, mini open rotator cuff repair;  Surgeon: Susa Day, MD;  Location: [facility];  Service: Orthopedics;  Laterality: Left;    There were no vitals filed for this visit.  Subjective Assessment - 08/14/17 1525    Subjective  "I was hurting all weekend for an unknown reason. I'm at 7 or 8/10 today"    Limitations  House hold activities    Currently in Pain?  Yes    Pain Score  8     Pain Location  Shoulder    Pain Orientation  Left    Pain Descriptors / Indicators  Aching    Pain Type  Surgical pain    Pain Onset  More than a month ago         [facility] PT Assessment - 08/14/17 0001      Assessment   Medical Diagnosis  Left shoulder SAD/DCR.      Restrictions   Weight Bearing Restrictions  No                   [facility] Adult PT Treatment/Exercise - 08/14/17 0001       Shoulder Exercises: Pulleys   Other Pulley Exercises  UE Ranger standing x 4 minutes into flexion and circles (CW and CWW). advised to take rest and perform in pain free ROM      Shoulder Exercises: ROM/Strengthening   UBE (Upper Arm Bike)  6 minutes AAROM.      Modalities   Modalities  Electrical Stimulation;Vasopneumatic      Acupuncturist Stimulation Location  L medial border of the scapula, anterior shoulder    Electrical Stimulation Action  pre-mod 2 channels 80-150 hz x15 min    Electrical Stimulation Goals  Pain      Vasopneumatic   Number Minutes Vasopneumatic   15 minutes    Vasopnuematic Location   Shoulder    Vasopneumatic Pressure  Medium    Vasopneumatic Temperature   34      Manual Therapy   Soft tissue mobilization  STW/M to anterior shoulder, left UT and mid traps to decrease pain and tone    Passive ROM  In supine:  PROM into flexion and ER with  slight holds at end range                  PT Long Term Goals - 08/11/17 1054      PT LONG TERM GOAL #1   Title  Ind with a HEP.    Time  4    Period  Weeks    Status  On-going      PT LONG TERM GOAL #2   Title  Active left shoulder flexion to 155 degrees so the patient can easily reach overhead.    Time  4    Period  Weeks    Status  On-going      PT LONG TERM GOAL #3   Title  Actve ER to 75 degrees+ to allow for easily donning/doffing of apparel.    Time  4    Period  Weeks    Status  On-going      PT LONG TERM GOAL #4   Title  Increase right shoulder strength to a solid 4+/5 to increase stability for performance of functional activities.    Time  4    Period  Weeks    Status  On-going      PT LONG TERM GOAL #5   Title  Perform ADL's with pain not > 3/10.    Time  4    Period  Weeks    Status  On-going            Plan - 08/14/17 1606    Clinical Impression Statement  Patient was able to complete treatment with rest. Patient reported decrease of pain at end  STW/M. PROM noted with fluid arc of motion and no reports of pain, just a stretch at end range. Patient instructed to give the shoulder some rest; pain may be associated with muscle being overworked. Patient reported agreement. Normal response to modalities upon removal.    Clinical Presentation  Stable    Clinical Decision Making  Low    Rehab Potential  Excellent    PT Frequency  3x / week    PT Duration  4 weeks    PT Treatment/Interventions  ADLs/Self Care Home Management;Cryotherapy;Electrical Stimulation;Ultrasound;Therapeutic activities;Therapeutic exercise;Patient/family education;Neuromuscular re-education;Manual techniques;Passive range of motion;Vasopneumatic Device    PT Next Visit Plan  Assess pain levels begin isometrics per pt tolerance and then progress to AROM to include UE Ranger on wall and then to resistive exercises. e'stim and vasopneumatic.    Consulted and Agree with Plan of Care  Patient       Patient will benefit from skilled therapeutic intervention in order to improve the following deficits and impairments:  Decreased activity tolerance, Decreased range of motion, Pain  Visit Diagnosis: Chronic left shoulder pain  Stiffness of left shoulder, not elsewhere classified     Problem List Patient Active Problem List   Diagnosis Date Noted  . C O P D 11/21/2008  . CHEST PAIN-UNSPECIFIED 10/21/2008  . TOBACCO ABUSE 09/02/2008  . SHORTNESS OF BREATH 09/02/2008  . PULMONARY NODULE 01/31/2007  . COUGH 01/31/2007   Gabriela Eves, PT, DPT  08/14/2017, 4:41 PM  [facility] [place], [place], [address] Phone: [phone]   Fax:  [phone]  Name: Miguel [name] MRN: [id number] Date of Birth: Jul 14, 1960

## 2017-08-16 ENCOUNTER — Ambulatory Visit: Payer: 59 | Admitting: Physical Therapy

## 2017-08-16 DIAGNOSIS — M25512 Pain in left shoulder: Principal | ICD-10-CM

## 2017-08-16 DIAGNOSIS — M25612 Stiffness of left shoulder, not elsewhere classified: Secondary | ICD-10-CM

## 2017-08-16 DIAGNOSIS — G8929 Other chronic pain: Secondary | ICD-10-CM

## 2017-08-16 NOTE — Therapy (Signed)
[facility] [place], [place], [address] Phone: [phone]   Fax:  [phone]  Physical Therapy Treatment  Patient Details  Name: Miguel [name] MRN: [id number] Date of Birth: 08/22/1960 Referring Provider: Susa Day MD   Encounter Date: 08/16/2017  PT End of Session - 08/16/17 0908    Visit Number  8    Number of Visits  12    Date for PT Re-Evaluation  08/28/17    Authorization Type  FOTO AT LEAST EVERY 5TH VISIT.  10TH VISIT PROGRESS NOTE.    PT Start Time  0900    PT Stop Time  0956    PT Time Calculation (min)  56 min    Activity Tolerance  Patient tolerated treatment well    Behavior During Therapy  WFL for tasks assessed/performed       Past Medical History:  Diagnosis Date  . Arthritis   . Diabetes mellitus without complication ([place])   . Sleep apnea     Past Surgical History:  Procedure Laterality Date  . BACK SURGERY    . COLONOSCOPY    . HAND SURGERY Right   . SHOULDER ARTHROSCOPY WITH ROTATOR CUFF REPAIR AND SUBACROMIAL DECOMPRESSION Left 07/07/2017   Procedure: Left shoulder arthroscopy, subacromial decompression, mini open rotator cuff repair;  Surgeon: Susa Day, MD;  Location: [place];  Service: Orthopedics;  Laterality: Left;    There were no vitals filed for this visit.  Subjective Assessment - 08/16/17 0951    Subjective  Patient reported 8/10 pain again today.     Limitations  House hold activities    Currently in Pain?  Yes    Pain Score  8     Pain Location  Shoulder    Pain Orientation  Left    Pain Descriptors / Indicators  Aching         [facility] PT Assessment - 08/16/17 0001      Assessment   Medical Diagnosis  Left shoulder SAD/DCR.      Restrictions   Weight Bearing Restrictions  No                   [facility] Adult PT Treatment/Exercise - 08/16/17 0001      Shoulder Exercises: ROM/Strengthening   UBE (Upper Arm Bike)  6 minutes AAROM.      Modalities    Modalities  Electrical Stimulation;Ultrasound      Acupuncturist Location  L medial border of the scapula, anterior shoulder    Electrical Stimulation Action  pre-mod 2 channels    Electrical Stimulation Parameters  80-150 hz x15 min    Electrical Stimulation Goals  Pain      Ultrasound   Ultrasound Location  Left UT    Ultrasound Parameters  1.5 w/cm2; 100%, 3.3 mHz x12 min    Ultrasound Goals  Pain      Vasopneumatic   Number Minutes Vasopneumatic   15 minutes    Vasopnuematic Location   Shoulder    Vasopneumatic Pressure  Medium    Vasopneumatic Temperature   34      Manual Therapy   Soft tissue mobilization  STW/M to anterior shoulder, left UT and mid traps to decrease pain and tone followed by IASTM to medial border of the scapula                  PT Long Term Goals - 08/11/17 1054      PT  LONG TERM GOAL #1   Title  Ind with a HEP.    Time  4    Period  Weeks    Status  On-going      PT LONG TERM GOAL #2   Title  Active left shoulder flexion to 155 degrees so the patient can easily reach overhead.    Time  4    Period  Weeks    Status  On-going      PT LONG TERM GOAL #3   Title  Actve ER to 75 degrees+ to allow for easily donning/doffing of apparel.    Time  4    Period  Weeks    Status  On-going      PT LONG TERM GOAL #4   Title  Increase right shoulder strength to a solid 4+/5 to increase stability for performance of functional activities.    Time  4    Period  Weeks    Status  On-going      PT LONG TERM GOAL #5   Title  Perform ADL's with pain not > 3/10.    Time  4    Period  Weeks    Status  On-going            Plan - 08/16/17 2992    Clinical Impression Statement  Patient was able to complete treatment despite L shoulder pain. Patient reported a decrease of pain after [place] and STW/M but pain level was still high. Normal response to modalities upon removal.    Clinical Presentation  Stable     Clinical Decision Making  Low    Rehab Potential  Excellent    PT Frequency  3x / week    PT Duration  4 weeks    PT Treatment/Interventions  ADLs/Self Care Home Management;Cryotherapy;Electrical Stimulation;Ultrasound;Therapeutic activities;Therapeutic exercise;Patient/family education;Neuromuscular re-education;Manual techniques;Passive range of motion;Vasopneumatic Device    PT Next Visit Plan  Assess pain levels begin isometrics per pt tolerance and then progress to AROM to include UE Ranger on wall and then to resistive exercises. e'stim and vasopneumatic.    Consulted and Agree with Plan of Care  Patient       Patient will benefit from skilled therapeutic intervention in order to improve the following deficits and impairments:  Decreased activity tolerance, Decreased range of motion, Pain  Visit Diagnosis: Chronic left shoulder pain  Stiffness of left shoulder, not elsewhere classified     Problem List Patient Active Problem List   Diagnosis Date Noted  . C O P D 11/21/2008  . CHEST PAIN-UNSPECIFIED 10/21/2008  . TOBACCO ABUSE 09/02/2008  . SHORTNESS OF BREATH 09/02/2008  . PULMONARY NODULE 01/31/2007  . COUGH 01/31/2007    Gabriela Eves, PT, DPT 08/16/2017, 10:38 AM  [facility] [address] [place], [place], [address] Phone: [phone]   Fax:  [phone]  Name: KERRICK MILER MRN: [id number] Date of Birth: 03-25-1961

## 2017-08-18 ENCOUNTER — Ambulatory Visit: Payer: 59 | Admitting: *Deleted

## 2017-08-18 DIAGNOSIS — G8929 Other chronic pain: Secondary | ICD-10-CM

## 2017-08-18 DIAGNOSIS — M25512 Pain in left shoulder: Principal | ICD-10-CM

## 2017-08-18 DIAGNOSIS — M25612 Stiffness of left shoulder, not elsewhere classified: Secondary | ICD-10-CM

## 2017-08-18 NOTE — Therapy (Signed)
[facility] [place], [place], [address] Phone: [phone]   Fax:  [phone]  Physical Therapy Treatment  Patient Details  Name: Miguel [name] MRN: [id number] Date of Birth: 10-Sep-1960 Referring Provider: Susa Day MD   Encounter Date: 08/18/2017  PT End of Session - 08/18/17 0906    Visit Number  9    Number of Visits  12    Date for PT Re-Evaluation  08/28/17    Authorization Type  FOTO AT LEAST EVERY 5TH VISIT.  10TH VISIT PROGRESS NOTE.    PT Start Time  0902    PT Stop Time  1000    PT Time Calculation (min)  58 min       Past Medical History:  Diagnosis Date  . Arthritis   . Diabetes mellitus without complication ([place])   . Sleep apnea     Past Surgical History:  Procedure Laterality Date  . BACK SURGERY    . COLONOSCOPY    . HAND SURGERY Right   . SHOULDER ARTHROSCOPY WITH ROTATOR CUFF REPAIR AND SUBACROMIAL DECOMPRESSION Left 07/07/2017   Procedure: Left shoulder arthroscopy, subacromial decompression, mini open rotator cuff repair;  Surgeon: Susa Day, MD;  Location: [place];  Service: Orthopedics;  Laterality: Left;    There were no vitals filed for this visit.  Subjective Assessment - 08/18/17 0904    Subjective  Patient reported 5-6/10 pain today. To MD 08-23-17     Limitations  House hold activities    Currently in Pain?  Yes    Pain Score  6     Pain Location  Shoulder    Pain Orientation  Left    Pain Descriptors / Indicators  Aching    Pain Type  Surgical pain    Pain Onset  More than a month ago                       [facility] Adult PT Treatment/Exercise - 08/18/17 0001      Shoulder Exercises: Pulleys   Other Pulley Exercises  UE Ranger standing x 5 minutes into flexion and circles (CW and CWW). advised to take rest and perform in pain free ROM      Shoulder Exercises: ROM/Strengthening   UBE (Upper Arm Bike)  6 minutes AAROM.      Modalities   Modalities   Electrical Stimulation;Ultrasound      Acupuncturist Location  L medial border of the scapula, anterior shoulder    Electrical Stimulation Action  IFC x 15 mins 80-150hz     Electrical Stimulation Goals  Pain      Vasopneumatic   Number Minutes Vasopneumatic   15 minutes    Vasopnuematic Location   Shoulder    Vasopneumatic Pressure  Medium    Vasopneumatic Temperature   34      Manual Therapy   Soft tissue mobilization  STW/M to , left UT trap to decrease pain and tone followed by IASTM to medial border of the scapula    Passive ROM  In supine: Scapula mobs and  PROM into flexion and ER with slight holds at end range, Rhythmic stab. for ER/IR in scaption and at flexion 100 , and 120 degrees.                  PT Long Term Goals - 08/11/17 1054      PT LONG TERM GOAL #1   Title  Ind with a HEP.    Time  4    Period  Weeks    Status  On-going      PT LONG TERM GOAL #2   Title  Active left shoulder flexion to 155 degrees so the patient can easily reach overhead.    Time  4    Period  Weeks    Status  On-going      PT LONG TERM GOAL #3   Title  Actve ER to 75 degrees+ to allow for easily donning/doffing of apparel.    Time  4    Period  Weeks    Status  On-going      PT LONG TERM GOAL #4   Title  Increase right shoulder strength to a solid 4+/5 to increase stability for performance of functional activities.    Time  4    Period  Weeks    Status  On-going      PT LONG TERM GOAL #5   Title  Perform ADL's with pain not > 3/10.    Time  4    Period  Weeks    Status  On-going            Plan - 08/18/17 1045    Clinical Impression Statement  Pt arrived today doing fair with LT shldr pain. He feels that his ROM is doing good, but can't understand why his shldr aches so much. He was able to perform AAROM exs with some discomfort and did well with STW and PROM as well ascapula mobs. Pt needed V/C's to relax during manual work  and felt better after Rx. His PROM was still good  with flexion at 160 degrees and ER at 80 degrees. Pt advised to not over do it at home. Normal modality response.    Rehab Potential  Excellent    PT Frequency  3x / week    PT Duration  4 weeks    PT Treatment/Interventions  ADLs/Self Care Home Management;Cryotherapy;Electrical Stimulation;Ultrasound;Therapeutic activities;Therapeutic exercise;Patient/family education;Neuromuscular re-education;Manual techniques;Passive range of motion;Vasopneumatic Device    PT Next Visit Plan  Assess pain levels begin isometrics per pt tolerance and then progress to AROM to include UE Ranger on wall and then to resistive exercises. e'stim and vasopneumatic.    Consulted and Agree with Plan of Care  Patient       Patient will benefit from skilled therapeutic intervention in order to improve the following deficits and impairments:  Decreased activity tolerance, Decreased range of motion, Pain  Visit Diagnosis: Chronic left shoulder pain  Stiffness of left shoulder, not elsewhere classified     Problem List Patient Active Problem List   Diagnosis Date Noted  . C O P D 11/21/2008  . CHEST PAIN-UNSPECIFIED 10/21/2008  . TOBACCO ABUSE 09/02/2008  . SHORTNESS OF BREATH 09/02/2008  . PULMONARY NODULE 01/31/2007  . COUGH 01/31/2007    Patricia Fargo,CHRIS, PTA 08/18/2017, 12:57 PM  [facility] [place], [place], [address] Phone: [phone]   Fax:  [phone]  Name: Miguel [name] MRN: [id number] Date of Birth: [birth year]

## 2017-08-21 ENCOUNTER — Encounter: Payer: Medicare Other | Admitting: Physical Therapy

## 2017-08-22 ENCOUNTER — Ambulatory Visit: Payer: 59 | Admitting: Physical Therapy

## 2017-08-22 DIAGNOSIS — M25612 Stiffness of left shoulder, not elsewhere classified: Secondary | ICD-10-CM

## 2017-08-22 DIAGNOSIS — G8929 Other chronic pain: Secondary | ICD-10-CM

## 2017-08-22 DIAGNOSIS — M25512 Pain in left shoulder: Secondary | ICD-10-CM | POA: Diagnosis not present

## 2017-08-22 NOTE — Therapy (Signed)
[facility] [place], [place], [address] Phone: [phone]   Fax:  [phone]  Physical Therapy Treatment  Patient Details  Name: Miguel [name] MRN: [id number] Date of Birth: [birth year] Referring Provider: Susa Day MD   Encounter Date: 08/22/2017  PT End of Session - 08/22/17 0821    Visit Number  10    Number of Visits  12    Date for PT Re-Evaluation  08/28/17    Authorization Type  FOTO AT LEAST EVERY 5TH VISIT.  10TH VISIT PROGRESS NOTE.    PT Start Time  0815    PT Stop Time  0906    PT Time Calculation (min)  51 min    Activity Tolerance  Patient tolerated treatment well    Behavior During Therapy  [facility] for tasks assessed/performed       Past Medical History:  Diagnosis Date  . Arthritis   . Diabetes mellitus without complication ([place])   . Sleep apnea     Past Surgical History:  Procedure Laterality Date  . BACK SURGERY    . COLONOSCOPY    . HAND SURGERY Right   . SHOULDER ARTHROSCOPY WITH ROTATOR CUFF REPAIR AND SUBACROMIAL DECOMPRESSION Left 07/07/2017   Procedure: Left shoulder arthroscopy, subacromial decompression, mini open rotator cuff repair;  Surgeon: Susa Day, MD;  Location: [place];  Service: Orthopedics;  Laterality: Left;    There were no vitals filed for this visit.    Progress Note Reporting Period 07/31/17 to 08/22/17  See note below for Objective Data and Assessment of Progress/Goals.       Subjective Assessment - 08/22/17 0820    Subjective  Patient reported shoulder was giving him a lot of pain last night and both shoulders were affected. Pain rated at 5-6/10.    Limitations  House hold activities    Currently in Pain?  Yes    Pain Score  6     Pain Location  Shoulder    Pain Orientation  Left    Pain Descriptors / Indicators  Aching    Pain Type  Surgical pain    Pain Onset  More than a month ago    Pain Frequency  Constant         [facility] PT Assessment -  08/22/17 0001      Assessment   Medical Diagnosis  Left shoulder SAD/DCR.      Restrictions   Weight Bearing Restrictions  No      AROM   AROM Assessment Site  Shoulder    Right/Left Shoulder  Left    Left Shoulder Flexion  138 Degrees    Left Shoulder Internal Rotation  -- to abdomen    Left Shoulder External Rotation  54 Degrees                   [facility] Adult PT Treatment/Exercise - 08/22/17 0001      Exercises   Exercises  Shoulder      Shoulder Exercises: Pulleys   Other Pulley Exercises  UE Ranger standing x 5 minutes into flexion and circles (CW and CWW).      Shoulder Exercises: ROM/Strengthening   UBE (Upper Arm Bike)  6 minutes AAROM.      Modalities   Modalities  Electrical Stimulation;Ultrasound      Acupuncturist Location  L shoulder    Electrical Stimulation Action  IFC    Electrical Stimulation Parameters  80-150 x 15  min    Electrical Stimulation Goals  Pain      Vasopneumatic   Number Minutes Vasopneumatic   15 minutes    Vasopnuematic Location   Shoulder    Vasopneumatic Pressure  Low    Vasopneumatic Temperature   34      Manual Therapy   Joint Mobilization  Grade I-II shoulder posterior joint mobilizations and long axis distraction to decrease pain    Passive ROM  PROM into flexion and ER with slight holds at end range, Rhythmic stab. for ER/IR in scaption                  PT Long Term Goals - 08/22/17 5573      PT LONG TERM GOAL #1   Title  Ind with a HEP.    Time  4    Period  Weeks    Status  Achieved      PT LONG TERM GOAL #2   Title  Active left shoulder flexion to 155 degrees so the patient can easily reach overhead.    Time  4    Period  Weeks    Status  On-going      PT LONG TERM GOAL #3   Title  Actve ER to 75 degrees+ to allow for easily donning/doffing of apparel.    Time  4    Period  Weeks    Status  On-going      PT LONG TERM GOAL #4   Title  Increase right  shoulder strength to a solid 4+/5 to increase stability for performance of functional activities.    Time  4    Period  Weeks    Status  On-going      PT LONG TERM GOAL #5   Title  Perform ADL's with pain not > 3/10.    Time  4    Period  Weeks    Status  On-going            Plan - 08/22/17 0901    Clinical Impression Statement  Patien was able to complete treatment despite L shoulder pain. Patient was able to perform exercises with rest breaks. Patient required cuing to relax for improved PROM.  AROM limited secondary to pain; goals are ongoing at this time. PROM shoulder flexion to 160 degrees. Normal reponse to modalties at end of session.     Clinical Presentation  Stable    Clinical Decision Making  Low    Rehab Potential  Excellent    PT Frequency  3x / week    PT Duration  4 weeks    PT Treatment/Interventions  ADLs/Self Care Home Management;Cryotherapy;Electrical Stimulation;Ultrasound;Therapeutic activities;Therapeutic exercise;Patient/family education;Neuromuscular re-education;Manual techniques;Passive range of motion;Vasopneumatic Device    PT Next Visit Plan  FOTO next visit. Assess pain levels begin isometrics per pt tolerance and then progress to AROM to include UE Ranger on wall and then to resistive exercises. e'stim and vasopneumatic.    Consulted and Agree with Plan of Care  Patient       Patient will benefit from skilled therapeutic intervention in order to improve the following deficits and impairments:  Decreased activity tolerance, Decreased range of motion, Pain  Visit Diagnosis: Chronic left shoulder pain  Stiffness of left shoulder, not elsewhere classified     Problem List Patient Active Problem List   Diagnosis Date Noted  . C O P D 11/21/2008  . CHEST PAIN-UNSPECIFIED 10/21/2008  . TOBACCO ABUSE 09/02/2008  . SHORTNESS OF  BREATH 09/02/2008  . PULMONARY NODULE 01/31/2007  . COUGH 01/31/2007    Gabriela Eves, PT, DPT 08/22/2017, 10:39  AM  [facility] [address] [place], [place], [address] Phone: [phone]   Fax:  [phone]  Name: Miguel [name] MRN: [id number] Date of Birth: 01-Sep-1960

## 2017-08-25 ENCOUNTER — Ambulatory Visit: Payer: 59 | Admitting: *Deleted

## 2017-08-25 DIAGNOSIS — R5383 Other fatigue: Secondary | ICD-10-CM | POA: Diagnosis not present

## 2017-08-25 DIAGNOSIS — G8929 Other chronic pain: Secondary | ICD-10-CM

## 2017-08-25 DIAGNOSIS — M25612 Stiffness of left shoulder, not elsewhere classified: Secondary | ICD-10-CM | POA: Diagnosis not present

## 2017-08-25 DIAGNOSIS — M25512 Pain in left shoulder: Principal | ICD-10-CM

## 2017-08-25 DIAGNOSIS — I10 Essential (primary) hypertension: Secondary | ICD-10-CM | POA: Diagnosis not present

## 2017-08-25 DIAGNOSIS — E1165 Type 2 diabetes mellitus with hyperglycemia: Secondary | ICD-10-CM | POA: Diagnosis not present

## 2017-08-25 DIAGNOSIS — E7800 Pure hypercholesterolemia, unspecified: Secondary | ICD-10-CM | POA: Diagnosis not present

## 2017-08-25 NOTE — Therapy (Signed)
[facility] [place], [place], [address] Phone: [phone]   Fax:  [phone]  Physical Therapy Treatment  Patient Details  Name: Miguel [name] MRN: [id number] Date of Birth: [birth year] Referring Provider: Susa Day MD   Encounter Date: 08/25/2017  PT End of Session - 08/25/17 8366    Visit Number  11    Number of Visits  12    Date for PT Re-Evaluation  08/28/17    Authorization Type  FOTO AT LEAST EVERY 5TH VISIT.  10TH VISIT PROGRESS NOTE.      PT Start Time  0815    PT Stop Time  0915    PT Time Calculation (min)  60 min       Past Medical History:  Diagnosis Date  . Arthritis   . Diabetes mellitus without complication ([place])   . Sleep apnea     Past Surgical History:  Procedure Laterality Date  . BACK SURGERY    . COLONOSCOPY    . HAND SURGERY Right   . SHOULDER ARTHROSCOPY WITH ROTATOR CUFF REPAIR AND SUBACROMIAL DECOMPRESSION Left 07/07/2017   Procedure: Left shoulder arthroscopy, subacromial decompression, mini open rotator cuff repair;  Surgeon: Susa Day, MD;  Location: [place];  Service: Orthopedics;  Laterality: Left;    There were no vitals filed for this visit.  Subjective Assessment - 08/25/17 0815    Subjective  Pt reports that he went to MD yesterday and he wants him to start strengthening. He was pleased with current status.    Limitations  House hold activities    Currently in Pain?  Yes    Pain Score  6     Pain Location  Shoulder    Pain Orientation  Left    Pain Descriptors / Indicators  Aching    Pain Type  Surgical pain    Pain Onset  More than a month ago    Pain Frequency  Constant                       [facility] Adult PT Treatment/Exercise - 08/25/17 0001      Exercises   Exercises  Shoulder      Shoulder Exercises: Prone   Retraction  Left;20 reps    Extension  AROM;20 reps    Horizontal ABduction 1  AROM;Left;20 reps      Shoulder Exercises:  Standing   Flexion  AROM;Left;20 reps;10 reps to 90 degrees cues to not compensate    Other Standing Exercises  RW4 with yellow tband 2x10 HEP sheet given and yellow tband      Shoulder Exercises: Pulleys   Other Pulley Exercises  UE Ranger standing x 4 minutes into flexion and circles (CW and CWW).      Shoulder Exercises: ROM/Strengthening   UBE (Upper Arm Bike)  6 minutes AROM      Electrical Stimulation   Electrical Stimulation Location  L shoulder   IFC x 15 mins 80-150hz     Electrical Stimulation Goals  Pain      Vasopneumatic   Number Minutes Vasopneumatic   15 minutes    Vasopnuematic Location   Shoulder    Vasopneumatic Pressure  Low    Vasopneumatic Temperature   34                  PT Long Term Goals - 08/22/17 2947      PT LONG TERM GOAL #1   Title  Ind  with a HEP.    Time  4    Period  Weeks    Status  Achieved      PT LONG TERM GOAL #2   Title  Active left shoulder flexion to 155 degrees so the patient can easily reach overhead.    Time  4    Period  Weeks    Status  On-going      PT LONG TERM GOAL #3   Title  Actve ER to 75 degrees+ to allow for easily donning/doffing of apparel.    Time  4    Period  Weeks    Status  On-going      PT LONG TERM GOAL #4   Title  Increase right shoulder strength to a solid 4+/5 to increase stability for performance of functional activities.    Time  4    Period  Weeks    Status  On-going      PT LONG TERM GOAL #5   Title  Perform ADL's with pain not > 3/10.    Time  4    Period  Weeks    Status  On-going            Plan - 08/25/17 4401    Clinical Impression Statement  Pt arrived today after MD visit and reports MD was pleased and wants him to start strengthening. He did well with Therex today and RW4 with yellow tband was added and given for HEP. Pt to bring order next Rx       Patient will benefit from skilled therapeutic intervention in order to improve the following deficits and  impairments:     Visit Diagnosis: Chronic left shoulder pain  Stiffness of left shoulder, not elsewhere classified     Problem List Patient Active Problem List   Diagnosis Date Noted  . C O P D 11/21/2008  . CHEST PAIN-UNSPECIFIED 10/21/2008  . TOBACCO ABUSE 09/02/2008  . SHORTNESS OF BREATH 09/02/2008  . PULMONARY NODULE 01/31/2007  . COUGH 01/31/2007    RAMSEUR,CHRIS, PTA 08/25/2017, 10:18 AM  [facility] [place], [place], [address] Phone: [phone]   Fax:  [phone]  Name: Miguel [name] MRN: [id number] Date of Birth: February 25, 1961

## 2017-08-28 ENCOUNTER — Ambulatory Visit: Payer: 59 | Admitting: Physical Therapy

## 2017-08-28 ENCOUNTER — Encounter: Payer: Self-pay | Admitting: Physical Therapy

## 2017-08-28 DIAGNOSIS — G8929 Other chronic pain: Secondary | ICD-10-CM | POA: Diagnosis not present

## 2017-08-28 DIAGNOSIS — M25512 Pain in left shoulder: Principal | ICD-10-CM

## 2017-08-28 DIAGNOSIS — M25612 Stiffness of left shoulder, not elsewhere classified: Secondary | ICD-10-CM

## 2017-08-28 NOTE — Therapy (Signed)
[facility] [place], [place], [address] Phone: [phone]   Fax:  [phone]  Physical Therapy Treatment  Patient Details  Name: Miguel [name] MRN: [id number] Date of Birth: 1961/02/16 Referring Provider: Susa Day MD   Encounter Date: 08/28/2017  PT End of Session - 08/28/17 0824    Visit Number  12    Number of Visits  24    Date for PT Re-Evaluation  09/21/17 per NO signed on 08/24/2017 for 2-3x/week for 3-4 weeks    Authorization Type  FOTO AT LEAST EVERY 5TH VISIT.  10TH VISIT PROGRESS NOTE.      PT Start Time  0815    PT Stop Time  0854    PT Time Calculation (min)  39 min    Activity Tolerance  Patient tolerated treatment well    Behavior During Therapy  [facility] for tasks assessed/performed       Past Medical History:  Diagnosis Date  . Arthritis   . Diabetes mellitus without complication ([place])   . Sleep apnea     Past Surgical History:  Procedure Laterality Date  . BACK SURGERY    . COLONOSCOPY    . HAND SURGERY Right   . SHOULDER ARTHROSCOPY WITH ROTATOR CUFF REPAIR AND SUBACROMIAL DECOMPRESSION Left 07/07/2017   Procedure: Left shoulder arthroscopy, subacromial decompression, mini open rotator cuff repair;  Surgeon: Susa Day, MD;  Location: [place];  Service: Orthopedics;  Laterality: Left;    There were no vitals filed for this visit.  Subjective Assessment - 08/28/17 0817    Subjective  Reports that his shoulder is sore this morning and his back is out on him too.    Limitations  House hold activities    Currently in Pain?  Yes    Pain Score  6     Pain Location  Shoulder    Pain Orientation  Left    Pain Descriptors / Indicators  Sore    Pain Type  Surgical pain    Pain Onset  More than a month ago         [facility] PT Assessment - 08/28/17 0001      Assessment   Medical Diagnosis  Left shoulder SAD/DCR.    Onset Date/Surgical Date  07/06/17    Next MD Visit  6 weeks       Restrictions   Weight Bearing Restrictions  No                   [facility] Adult PT Treatment/Exercise - 08/28/17 0001      Shoulder Exercises: Prone   Retraction  AROM;Left;20 reps    Extension  AROM;Left;20 reps    Horizontal ABduction 1  AROM;Left;20 reps      Shoulder Exercises: Standing   Protraction  Strengthening;Left;20 reps;Theraband    Theraband Level (Shoulder Protraction)  Level 1 (Yellow)    External Rotation  Strengthening;Left;20 reps;Theraband    Theraband Level (Shoulder External Rotation)  Level 1 (Yellow)    Internal Rotation  Strengthening;Left;20 reps;Theraband    Theraband Level (Shoulder Internal Rotation)  Level 1 (Yellow)    Flexion  AROM;Left;20 reps to 90 deg    Extension  Strengthening;Left;20 reps;Theraband    Theraband Level (Shoulder Extension)  Level 1 (Yellow)    Row  Strengthening;Left;20 reps;Theraband    Theraband Level (Shoulder Row)  Level 1 (Yellow)      Shoulder Exercises: Pulleys   Other Pulley Exercises  UE ranger flexion x20 reps,  CW and CCW circles x20 reps      Shoulder Exercises: ROM/Strengthening   UBE (Upper Arm Bike)  6 minutes AROM    Wall Wash  x20 reps CW and CCW       Modalities   Modalities  Electrical Stimulation;Vasopneumatic      Acupuncturist Location  L shoulder    Electrical Stimulation Action  IFC    Electrical Stimulation Parameters  1-10 hz x15 min    Electrical Stimulation Goals  Pain      Vasopneumatic   Number Minutes Vasopneumatic   15 minutes    Vasopnuematic Location   Shoulder    Vasopneumatic Pressure  Low    Vasopneumatic Temperature   34                  PT Long Term Goals - 08/22/17 9798      PT LONG TERM GOAL #1   Title  Ind with a HEP.    Time  4    Period  Weeks    Status  Achieved      PT LONG TERM GOAL #2   Title  Active left shoulder flexion to 155 degrees so the patient can easily reach overhead.    Time  4    Period  Weeks     Status  On-going      PT LONG TERM GOAL #3   Title  Actve ER to 75 degrees+ to allow for easily donning/doffing of apparel.    Time  4    Period  Weeks    Status  On-going      PT LONG TERM GOAL #4   Title  Increase right shoulder strength to a solid 4+/5 to increase stability for performance of functional activities.    Time  4    Period  Weeks    Status  On-going      PT LONG TERM GOAL #5   Title  Perform ADL's with pain not > 3/10.    Time  4    Period  Weeks    Status  On-going            Plan - 08/28/17 0905    Clinical Impression Statement  Patient tolerated today's treatment fairly well as he arrived in clinic with reports of L shoulder soreness and his back being "out." Patient experienced disturbed sleep last night as well per patient report. Patient able to complete therex session but decreased speed between exercises per back pain with prone exercises. Limited therex completed as to not further exaggerate L shoulder pain. Normal modalities response noted following removal of the modalities.    Rehab Potential  Excellent    PT Frequency  3x / week    PT Duration  4 weeks    PT Treatment/Interventions  ADLs/Self Care Home Management;Cryotherapy;Electrical Stimulation;Ultrasound;Therapeutic activities;Therapeutic exercise;Patient/family education;Neuromuscular re-education;Manual techniques;Passive range of motion;Vasopneumatic Device    PT Next Visit Plan  Continue per strengthening requests per MD and modalities PRN.    Consulted and Agree with Plan of Care  Patient       Patient will benefit from skilled therapeutic intervention in order to improve the following deficits and impairments:  Decreased activity tolerance, Decreased range of motion, Pain  Visit Diagnosis: Chronic left shoulder pain  Stiffness of left shoulder, not elsewhere classified     Problem List Patient Active Problem List   Diagnosis Date Noted  . C O P D 11/21/2008  .  CHEST  PAIN-UNSPECIFIED 10/21/2008  . TOBACCO ABUSE 09/02/2008  . SHORTNESS OF BREATH 09/02/2008  . PULMONARY NODULE 01/31/2007  . COUGH 01/31/2007    Standley Brooking, PTA 08/28/2017, 9:11 AM  [facility] [address] [place], [place], [address] Phone: [phone]   Fax:  [phone]  Name: JOVEN MOM MRN: [id number] Date of Birth: [birth year]

## 2017-08-30 ENCOUNTER — Encounter: Payer: Medicare Other | Admitting: Physical Therapy

## 2017-08-30 DIAGNOSIS — J439 Emphysema, unspecified: Secondary | ICD-10-CM | POA: Diagnosis not present

## 2017-08-30 DIAGNOSIS — Z0000 Encounter for general adult medical examination without abnormal findings: Secondary | ICD-10-CM | POA: Diagnosis not present

## 2017-08-30 DIAGNOSIS — R5382 Chronic fatigue, unspecified: Secondary | ICD-10-CM | POA: Diagnosis not present

## 2017-09-01 ENCOUNTER — Encounter: Payer: Medicare Other | Admitting: Physical Therapy

## 2017-09-06 ENCOUNTER — Ambulatory Visit: Payer: 59 | Admitting: Physical Therapy

## 2017-09-06 DIAGNOSIS — M25612 Stiffness of left shoulder, not elsewhere classified: Secondary | ICD-10-CM

## 2017-09-06 DIAGNOSIS — G8929 Other chronic pain: Secondary | ICD-10-CM

## 2017-09-06 DIAGNOSIS — M25512 Pain in left shoulder: Principal | ICD-10-CM

## 2017-09-06 NOTE — Therapy (Signed)
[name], [place], [address] Phone: [phone]   Fax:  [phone]  Physical Therapy Treatment  Patient Details  Name: Miguel [name] MRN: [id number] Date of Birth: 1960-09-20 Referring Provider: Susa Day MD   Encounter Date: 09/06/2017  PT End of Session - 09/06/17 0724    Visit Number  13    Number of Visits  24    Date for PT Re-Evaluation  09/21/17    Authorization Type  FOTO AT LEAST EVERY 5TH VISIT.  10TH VISIT PROGRESS NOTE.      PT Start Time  0721    PT Stop Time  [phone] Requested early leave for another appointment    PT Time Calculation (min)  35 min    Activity Tolerance  Patient tolerated treatment well    Behavior During Therapy  [facility] for tasks assessed/performed       Past Medical History:  Diagnosis Date  . Arthritis   . Diabetes mellitus without complication ([place])   . Sleep apnea     Past Surgical History:  Procedure Laterality Date  . BACK SURGERY    . COLONOSCOPY    . HAND SURGERY Right   . SHOULDER ARTHROSCOPY WITH ROTATOR CUFF REPAIR AND SUBACROMIAL DECOMPRESSION Left 07/07/2017   Procedure: Left shoulder arthroscopy, subacromial decompression, mini open rotator cuff repair;  Surgeon: Susa Day, MD;  Location: [place];  Service: Orthopedics;  Laterality: Left;    There were no vitals filed for this visit.  Subjective Assessment - 09/06/17 0733    Subjective  Patient reports pain is betwen 4-5-6/10 and stated it fluctuates but still has most of his pain in the anterior shoulder.    Limitations  House hold activities    Currently in Pain?  Yes    Pain Score  6     Pain Location  Shoulder    Pain Orientation  Left    Pain Descriptors / Indicators  Sore    Pain Type  Surgical pain    Pain Onset  More than a month ago         [facility] PT Assessment - 09/06/17 0001      Assessment   Medical Diagnosis  Left shoulder SAD/DCR.    Onset Date/Surgical Date  07/06/17    Next  MD Visit  6 weeks                   [facility] Adult PT Treatment/Exercise - 09/06/17 0001      Shoulder Exercises: Prone   Retraction  AROM;Left;20 reps    Extension  AROM;Left;20 reps    Horizontal ABduction 1  AROM;Left;20 reps      Shoulder Exercises: Standing   Protraction  Strengthening;Left;20 reps;Theraband    Theraband Level (Shoulder Protraction)  Level 1 (Yellow)    External Rotation  Strengthening;Left;20 reps;Theraband    Theraband Level (Shoulder External Rotation)  Level 1 (Yellow)    Internal Rotation  Strengthening;Left;20 reps;Theraband    Theraband Level (Shoulder Internal Rotation)  Level 1 (Yellow)    Flexion  AROM;Left;20 reps    Extension  Strengthening;Left;20 reps;Theraband    Theraband Level (Shoulder Extension)  Level 1 (Yellow)    Row  Strengthening;Left;20 reps;Theraband    Theraband Level (Shoulder Row)  Level 1 (Yellow)    Other Standing Exercises  scapular clocks (12,9,6) x15      Shoulder Exercises: Pulleys   Other Pulley Exercises  UE ranger flexion CW and CCW circles 5 minutes  Shoulder Exercises: ROM/Strengthening   UBE (Upper Arm Bike)  6 minutes AROM                  PT Long Term Goals - 08/22/17 0932      PT LONG TERM GOAL #1   Title  Ind with a HEP.    Time  4    Period  Weeks    Status  Achieved      PT LONG TERM GOAL #2   Title  Active left shoulder flexion to 155 degrees so the patient can easily reach overhead.    Time  4    Period  Weeks    Status  On-going      PT LONG TERM GOAL #3   Title  Actve ER to 75 degrees+ to allow for easily donning/doffing of apparel.    Time  4    Period  Weeks    Status  On-going      PT LONG TERM GOAL #4   Title  Increase right shoulder strength to a solid 4+/5 to increase stability for performance of functional activities.    Time  4    Period  Weeks    Status  On-going      PT LONG TERM GOAL #5   Title  Perform ADL's with pain not > 3/10.    Time  4     Period  Weeks    Status  On-going            Plan - 09/06/17 0730    Clinical Impression Statement  Patient's treatment session was shortened secondary to time constraint. Patient overall was able to complete exercises with minimal reports of increased pain. Patient demonstrated good form with all but required tactile cuing for proper form during external rotation with band exercise. Patient noted with decreased muscle trigger point after STW/M. Patient educated to use a tennis ball to for self release of musculature around left scapula. Patient demonstrated good form and reported understanding. Patient opted out of modalities today and stated he will ice and e-stim at home.     Clinical Presentation  Stable    Clinical Decision Making  Low    Rehab Potential  Excellent    PT Frequency  3x / week    PT Duration  4 weeks    PT Treatment/Interventions  ADLs/Self Care Home Management;Cryotherapy;Electrical Stimulation;Ultrasound;Therapeutic activities;Therapeutic exercise;Patient/family education;Neuromuscular re-education;Manual techniques;Passive range of motion;Vasopneumatic Device    PT Next Visit Plan  Continue per strengthening requests per MD and modalities PRN.    Consulted and Agree with Plan of Care  Patient       Patient will benefit from skilled therapeutic intervention in order to improve the following deficits and impairments:  Decreased activity tolerance, Decreased range of motion, Pain  Visit Diagnosis: Chronic left shoulder pain  Stiffness of left shoulder, not elsewhere classified     Problem List Patient Active Problem List   Diagnosis Date Noted  . C O P D 11/21/2008  . CHEST PAIN-UNSPECIFIED 10/21/2008  . TOBACCO ABUSE 09/02/2008  . SHORTNESS OF BREATH 09/02/2008  . PULMONARY NODULE 01/31/2007  . COUGH 01/31/2007   Miguel [name], PT, DPT 09/06/2017, 8:01 AM  [facility] [address] [place], [place],  [address] Phone: [phone]   Fax:  [phone]  Name: Miguel [name] MRN: [id number] Date of Birth: 06-Aug-1960

## 2017-09-08 ENCOUNTER — Encounter: Payer: Self-pay | Admitting: Physical Therapy

## 2017-09-08 ENCOUNTER — Ambulatory Visit: Payer: 59 | Admitting: Physical Therapy

## 2017-09-08 DIAGNOSIS — M25612 Stiffness of left shoulder, not elsewhere classified: Secondary | ICD-10-CM | POA: Diagnosis not present

## 2017-09-08 DIAGNOSIS — G8929 Other chronic pain: Secondary | ICD-10-CM

## 2017-09-08 DIAGNOSIS — M25512 Pain in left shoulder: Secondary | ICD-10-CM | POA: Diagnosis not present

## 2017-09-08 NOTE — Therapy (Addendum)
[facility] [place], [place], [address] Phone: [phone]   Fax:  [phone]  Physical Therapy Treatment  Patient Details  Name: Miguel [name] MRN: [id number] Date of Birth: 1960-07-23 Referring Provider: Susa Day MD   Encounter Date: 09/08/2017  PT End of Session - 09/08/17 0735    Visit Number  14    Number of Visits  24    Date for PT Re-Evaluation  09/21/17    Authorization Type  FOTO AT LEAST EVERY 5TH VISIT.  10TH VISIT PROGRESS NOTE.      PT Start Time  0732    PT Stop Time  0815    PT Time Calculation (min)  43 min    Activity Tolerance  Patient tolerated treatment well    Behavior During Therapy  [facility] for tasks assessed/performed       Past Medical History:  Diagnosis Date  . Arthritis   . Diabetes mellitus without complication ([place])   . Sleep apnea     Past Surgical History:  Procedure Laterality Date  . BACK SURGERY    . COLONOSCOPY    . HAND SURGERY Right   . SHOULDER ARTHROSCOPY WITH ROTATOR CUFF REPAIR AND SUBACROMIAL DECOMPRESSION Left 07/07/2017   Procedure: Left shoulder arthroscopy, subacromial decompression, mini open rotator cuff repair;  Surgeon: Susa Day, MD;  Location: [place];  Service: Orthopedics;  Laterality: Left;    There were no vitals filed for this visit.  Subjective Assessment - 09/08/17 0735    Subjective  Reports L shoulder pain. Reports burning surrounding the L scapula and if not for that pain he could tolerate everything else.    Limitations  House hold activities    Currently in Pain?  Yes    Pain Score  6     Pain Location  Scapula    Pain Orientation  Left    Pain Descriptors / Indicators  Burning    Pain Type  Surgical pain    Pain Onset  More than a month ago    Pain Frequency  Constant         [facility] PT Assessment - 09/08/17 0001      Assessment   Medical Diagnosis  Left shoulder SAD/DCR.    Onset Date/Surgical Date  07/06/17    Next MD Visit   6 weeks      Restrictions   Weight Bearing Restrictions  No                   [facility] Adult PT Treatment/Exercise - 09/08/17 0001      Shoulder Exercises: Prone   Retraction  AROM;Left;20 reps    Extension  AROM;Left;20 reps    Horizontal ABduction 1  AROM;Left;20 reps      Shoulder Exercises: Standing   Protraction  Strengthening;Left;20 reps;Theraband    Theraband Level (Shoulder Protraction)  Level 1 (Yellow)    External Rotation  Strengthening;Left;20 reps;Theraband    Theraband Level (Shoulder External Rotation)  Level 1 (Yellow)    Internal Rotation  Strengthening;Left;20 reps;Theraband    Theraband Level (Shoulder Internal Rotation)  Level 1 (Yellow)    Flexion  AROM;Left;20 reps    Extension  Strengthening;Left;20 reps;Theraband    Theraband Level (Shoulder Extension)  Level 1 (Yellow)    Row  Strengthening;Left;20 reps;Theraband    Theraband Level (Shoulder Row)  Level 1 (Yellow)    Other Standing Exercises  scapular clocks (12,9,6) x15      Shoulder Exercises: Pulleys  Flexion  3 minutes    Other Pulley Exercises  UE ranger flexion CW and CCW circles 5 minutes      Shoulder Exercises: ROM/Strengthening   UBE (Upper Arm Bike)  120 RPM x6 min      Modalities   Modalities  Electrical Stimulation;Vasopneumatic      Electrical Stimulation   Electrical Stimulation Location  L shoulder    Electrical Stimulation Action  IFC    Electrical Stimulation Parameters  1-10 hz x15 min    Electrical Stimulation Goals  Pain;Edema      Vasopneumatic   Number Minutes Vasopneumatic   15 minutes    Vasopnuematic Location   Shoulder    Vasopneumatic Pressure  Low    Vasopneumatic Temperature   34                  PT Long Term Goals - 08/22/17 1191      PT LONG TERM GOAL #1   Title  Ind with a HEP.    Time  4    Period  Weeks    Status  Achieved      PT LONG TERM GOAL #2   Title  Active left shoulder flexion to 155 degrees so the patient can easily  reach overhead.    Time  4    Period  Weeks    Status  On-going      PT LONG TERM GOAL #3   Title  Actve ER to 75 degrees+ to allow for easily donning/doffing of apparel.    Time  4    Period  Weeks    Status  On-going      PT LONG TERM GOAL #4   Title  Increase right shoulder strength to a solid 4+/5 to increase stability for performance of functional activities.    Time  4    Period  Weeks    Status  On-going      PT LONG TERM GOAL #5   Title  Perform ADL's with pain not > 3/10.    Time  4    Period  Weeks    Status  On-going            Plan - 09/08/17 0802    Clinical Impression Statement  Patient tolerated today's treatment fairly well as he continues to have L scapula burning and L shoulder discomfort. Patient able to demonstrate fairly good technique with all exercises although slightly limited with standing scapular clocks into abduction motion. No complaints of pain during the treatment. Normal modalities response noted following removal of the modalities.    Rehab Potential  Excellent    PT Frequency  3x / week    PT Duration  4 weeks    PT Treatment/Interventions  ADLs/Self Care Home Management;Cryotherapy;Electrical Stimulation;Ultrasound;Therapeutic activities;Therapeutic exercise;Patient/family education;Neuromuscular re-education;Manual techniques;Passive range of motion;Vasopneumatic Device    PT Next Visit Plan  Continue per strengthening requests per MD and modalities PRN.    Consulted and Agree with Plan of Care  Patient       Patient will benefit from skilled therapeutic intervention in order to improve the following deficits and impairments:  Decreased activity tolerance, Decreased range of motion, Pain  Visit Diagnosis: Chronic left shoulder pain  Stiffness of left shoulder, not elsewhere classified     Problem List Patient Active Problem List   Diagnosis Date Noted  . C O P D 11/21/2008  . CHEST PAIN-UNSPECIFIED 10/21/2008  . TOBACCO ABUSE  09/02/2008  . SHORTNESS  OF BREATH 09/02/2008  . PULMONARY NODULE 01/31/2007  . COUGH 01/31/2007    Standley Brooking, PTA 09/08/2017, 8:22 AM  [facility] [address] [place], [place], [address] Phone: [phone]   Fax:  [phone]  Name: Miguel [name] MRN: [id number] Date of Birth: 04-08-1961  PHYSICAL THERAPY DISCHARGE SUMMARY  Visits from Start of Care: 14.  Current functional level related to goals / functional outcomes: See above.   Remaining deficits: Continued pain complaints.   Education / Equipment: HEP. Plan: Patient agrees to discharge.  Patient goals were partially met. Patient is being discharged due to                                                     ?????         Mali Applegate MPT

## 2017-09-11 ENCOUNTER — Ambulatory Visit: Payer: 59 | Admitting: *Deleted

## 2017-09-13 ENCOUNTER — Encounter: Payer: Medicare Other | Admitting: Physical Therapy

## 2017-09-15 ENCOUNTER — Encounter: Payer: Medicare Other | Admitting: Physical Therapy

## 2017-09-19 DIAGNOSIS — M5136 Other intervertebral disc degeneration, lumbar region: Secondary | ICD-10-CM | POA: Diagnosis not present

## 2017-10-18 DIAGNOSIS — R5383 Other fatigue: Secondary | ICD-10-CM | POA: Diagnosis not present

## 2018-01-04 DIAGNOSIS — J449 Chronic obstructive pulmonary disease, unspecified: Secondary | ICD-10-CM | POA: Diagnosis not present

## 2018-01-04 DIAGNOSIS — M25512 Pain in left shoulder: Secondary | ICD-10-CM | POA: Diagnosis not present

## 2018-01-04 DIAGNOSIS — M542 Cervicalgia: Secondary | ICD-10-CM | POA: Diagnosis not present

## 2018-01-04 DIAGNOSIS — Z720 Tobacco use: Secondary | ICD-10-CM | POA: Diagnosis not present

## 2018-02-15 DIAGNOSIS — M4722 Other spondylosis with radiculopathy, cervical region: Secondary | ICD-10-CM | POA: Diagnosis not present

## 2018-02-15 DIAGNOSIS — M5136 Other intervertebral disc degeneration, lumbar region: Secondary | ICD-10-CM | POA: Diagnosis not present

## 2018-02-27 DIAGNOSIS — E1165 Type 2 diabetes mellitus with hyperglycemia: Secondary | ICD-10-CM | POA: Diagnosis not present

## 2018-02-27 DIAGNOSIS — E039 Hypothyroidism, unspecified: Secondary | ICD-10-CM | POA: Diagnosis not present

## 2018-02-27 DIAGNOSIS — E7800 Pure hypercholesterolemia, unspecified: Secondary | ICD-10-CM | POA: Diagnosis not present

## 2018-02-27 DIAGNOSIS — I10 Essential (primary) hypertension: Secondary | ICD-10-CM | POA: Diagnosis not present

## 2018-03-01 DIAGNOSIS — I10 Essential (primary) hypertension: Secondary | ICD-10-CM | POA: Diagnosis not present

## 2018-03-01 DIAGNOSIS — Z23 Encounter for immunization: Secondary | ICD-10-CM | POA: Diagnosis not present

## 2018-03-01 DIAGNOSIS — J439 Emphysema, unspecified: Secondary | ICD-10-CM | POA: Diagnosis not present

## 2018-03-01 DIAGNOSIS — R5382 Chronic fatigue, unspecified: Secondary | ICD-10-CM | POA: Diagnosis not present

## 2018-06-12 DIAGNOSIS — M5136 Other intervertebral disc degeneration, lumbar region: Secondary | ICD-10-CM | POA: Diagnosis not present

## 2018-06-12 DIAGNOSIS — M4722 Other spondylosis with radiculopathy, cervical region: Secondary | ICD-10-CM | POA: Diagnosis not present

## 2018-06-12 DIAGNOSIS — M961 Postlaminectomy syndrome, not elsewhere classified: Secondary | ICD-10-CM | POA: Diagnosis not present

## 2018-06-12 DIAGNOSIS — M5030 Other cervical disc degeneration, unspecified cervical region: Secondary | ICD-10-CM | POA: Diagnosis not present

## 2018-07-26 DIAGNOSIS — Z6828 Body mass index (BMI) 28.0-28.9, adult: Secondary | ICD-10-CM | POA: Diagnosis not present

## 2018-07-26 DIAGNOSIS — M79671 Pain in right foot: Secondary | ICD-10-CM | POA: Diagnosis not present

## 2018-07-26 DIAGNOSIS — I739 Peripheral vascular disease, unspecified: Secondary | ICD-10-CM | POA: Diagnosis not present

## 2018-07-26 DIAGNOSIS — L723 Sebaceous cyst: Secondary | ICD-10-CM | POA: Diagnosis not present

## 2018-09-18 DIAGNOSIS — M5030 Other cervical disc degeneration, unspecified cervical region: Secondary | ICD-10-CM | POA: Diagnosis not present

## 2018-09-18 DIAGNOSIS — M5136 Other intervertebral disc degeneration, lumbar region: Secondary | ICD-10-CM | POA: Diagnosis not present

## 2018-09-25 DIAGNOSIS — I10 Essential (primary) hypertension: Secondary | ICD-10-CM | POA: Diagnosis not present

## 2018-09-25 DIAGNOSIS — E7800 Pure hypercholesterolemia, unspecified: Secondary | ICD-10-CM | POA: Diagnosis not present

## 2018-09-25 DIAGNOSIS — E1165 Type 2 diabetes mellitus with hyperglycemia: Secondary | ICD-10-CM | POA: Diagnosis not present

## 2018-09-25 DIAGNOSIS — E039 Hypothyroidism, unspecified: Secondary | ICD-10-CM | POA: Diagnosis not present

## 2018-09-25 DIAGNOSIS — R5383 Other fatigue: Secondary | ICD-10-CM | POA: Diagnosis not present

## 2018-09-25 DIAGNOSIS — R5382 Chronic fatigue, unspecified: Secondary | ICD-10-CM | POA: Diagnosis not present

## 2018-10-03 DIAGNOSIS — M5136 Other intervertebral disc degeneration, lumbar region: Secondary | ICD-10-CM | POA: Diagnosis not present

## 2018-10-03 DIAGNOSIS — Z79899 Other long term (current) drug therapy: Secondary | ICD-10-CM | POA: Diagnosis not present

## 2018-10-03 DIAGNOSIS — Z5181 Encounter for therapeutic drug level monitoring: Secondary | ICD-10-CM | POA: Diagnosis not present

## 2018-10-03 DIAGNOSIS — M542 Cervicalgia: Secondary | ICD-10-CM | POA: Diagnosis not present

## 2018-10-03 DIAGNOSIS — M5030 Other cervical disc degeneration, unspecified cervical region: Secondary | ICD-10-CM | POA: Diagnosis not present

## 2018-10-03 DIAGNOSIS — G894 Chronic pain syndrome: Secondary | ICD-10-CM | POA: Diagnosis not present

## 2018-10-03 DIAGNOSIS — Z79891 Long term (current) use of opiate analgesic: Secondary | ICD-10-CM | POA: Diagnosis not present

## 2018-10-18 DIAGNOSIS — M4722 Other spondylosis with radiculopathy, cervical region: Secondary | ICD-10-CM | POA: Diagnosis not present

## 2019-01-08 DIAGNOSIS — M4722 Other spondylosis with radiculopathy, cervical region: Secondary | ICD-10-CM | POA: Diagnosis not present

## 2019-01-08 DIAGNOSIS — M961 Postlaminectomy syndrome, not elsewhere classified: Secondary | ICD-10-CM | POA: Diagnosis not present

## 2019-01-08 DIAGNOSIS — M5136 Other intervertebral disc degeneration, lumbar region: Secondary | ICD-10-CM | POA: Diagnosis not present

## 2019-01-17 DIAGNOSIS — Z79891 Long term (current) use of opiate analgesic: Secondary | ICD-10-CM | POA: Diagnosis not present

## 2019-01-17 DIAGNOSIS — M5416 Radiculopathy, lumbar region: Secondary | ICD-10-CM | POA: Diagnosis not present

## 2019-01-17 DIAGNOSIS — M4722 Other spondylosis with radiculopathy, cervical region: Secondary | ICD-10-CM | POA: Diagnosis not present

## 2019-01-17 DIAGNOSIS — M961 Postlaminectomy syndrome, not elsewhere classified: Secondary | ICD-10-CM | POA: Diagnosis not present

## 2019-02-07 DIAGNOSIS — G894 Chronic pain syndrome: Secondary | ICD-10-CM | POA: Diagnosis not present

## 2019-02-07 DIAGNOSIS — M5136 Other intervertebral disc degeneration, lumbar region: Secondary | ICD-10-CM | POA: Diagnosis not present

## 2019-02-07 DIAGNOSIS — M5030 Other cervical disc degeneration, unspecified cervical region: Secondary | ICD-10-CM | POA: Diagnosis not present

## 2019-02-21 DIAGNOSIS — M542 Cervicalgia: Secondary | ICD-10-CM | POA: Diagnosis not present

## 2019-02-21 DIAGNOSIS — M545 Low back pain: Secondary | ICD-10-CM | POA: Diagnosis not present

## 2019-03-05 DIAGNOSIS — M542 Cervicalgia: Secondary | ICD-10-CM | POA: Diagnosis not present

## 2019-03-05 DIAGNOSIS — M5136 Other intervertebral disc degeneration, lumbar region: Secondary | ICD-10-CM | POA: Diagnosis not present

## 2019-03-13 DIAGNOSIS — M5030 Other cervical disc degeneration, unspecified cervical region: Secondary | ICD-10-CM | POA: Diagnosis not present

## 2019-03-13 DIAGNOSIS — G4700 Insomnia, unspecified: Secondary | ICD-10-CM | POA: Diagnosis not present

## 2019-03-13 DIAGNOSIS — M5416 Radiculopathy, lumbar region: Secondary | ICD-10-CM | POA: Diagnosis not present

## 2019-03-13 DIAGNOSIS — M961 Postlaminectomy syndrome, not elsewhere classified: Secondary | ICD-10-CM | POA: Diagnosis not present

## 2019-03-13 DIAGNOSIS — F17200 Nicotine dependence, unspecified, uncomplicated: Secondary | ICD-10-CM | POA: Diagnosis not present

## 2019-03-13 DIAGNOSIS — M5136 Other intervertebral disc degeneration, lumbar region: Secondary | ICD-10-CM | POA: Diagnosis not present

## 2019-03-13 DIAGNOSIS — G894 Chronic pain syndrome: Secondary | ICD-10-CM | POA: Diagnosis not present

## 2019-03-28 ENCOUNTER — Other Ambulatory Visit: Payer: Self-pay | Admitting: Neurological Surgery

## 2019-03-28 DIAGNOSIS — Z6827 Body mass index (BMI) 27.0-27.9, adult: Secondary | ICD-10-CM | POA: Diagnosis not present

## 2019-03-28 DIAGNOSIS — R030 Elevated blood-pressure reading, without diagnosis of hypertension: Secondary | ICD-10-CM | POA: Diagnosis not present

## 2019-03-28 DIAGNOSIS — M48061 Spinal stenosis, lumbar region without neurogenic claudication: Secondary | ICD-10-CM | POA: Diagnosis present

## 2019-04-03 DIAGNOSIS — E1165 Type 2 diabetes mellitus with hyperglycemia: Secondary | ICD-10-CM | POA: Diagnosis not present

## 2019-04-03 NOTE — Progress Notes (Signed)
Attempted to contact patient to schedule pre-procedure COVID appointment, no answer, left message for patient to return call

## 2019-04-04 NOTE — Pre-Procedure Instructions (Signed)
Miguel [name]  04/04/2019     [id number] - [place], [place] [address] [place] [address] Phone: [phone] Fax: [phone]   Your procedure is scheduled on Wednesday, April 17, 2019  Report to [facility] Admitting at 11:20 A.M.  Call this number if you have problems the morning of surgery:  [phone]   Remember: Bring your CPAP machine, mask and hose in with you on day of surgery.  Do not eat or drink after midnight. the night before surgery.    Take these medicines the morning of surgery with A SIP OF WATER:  gabapentin (NEURONTIN) Oxycodone varenicline (CHANTIX) If needed: tiZANidine (ZANAFLEX) for muscle spasms  7 days prior to surgery, stop taking Aspirin (unless otherwise advised by surgeon), vitamins, fish oil and herbal medications. Do not take any NSAIDs ie: Ibuprofen, Advil, Naproxen (Aleve), Motrin, BC and Goody Powder.   If pre-op instructions regarding Aspirin was not provided, please call the the surgeon's office for instructions.    How to Manage Your Diabetes Before and After Surgery  Why is it important to control my blood sugar before and after surgery? . Improving blood sugar levels before and after surgery helps healing and can limit problems. . A way of improving blood sugar control is eating a healthy diet by: o  Eating less sugar and carbohydrates o  Increasing activity/exercise o  Talking with your doctor about reaching your blood sugar goals . High blood sugars (greater than 180 mg/dL) can raise your risk of infections and slow your recovery, so you will need to focus on controlling your diabetes during the weeks before surgery. . Make sure that the doctor who takes care of your diabetes knows about your planned surgery including the date and location.  How do I manage my blood sugar before surgery? . Check your blood sugar at least 4 times a day, starting 2 days  before surgery, to make sure that the level is not too high or low. o Check your blood sugar the morning of your surgery when you wake up and every 2 hours until you get to the Short Stay unit. . If your blood sugar is less than 70 mg/dL, you will need to treat for low blood sugar: o Do not take insulin. o Treat a low blood sugar (less than 70 mg/dL) with  cup of clear juice (cranberry or apple), 4 glucose tablets, OR glucose gel. Recheck blood sugar in 15 minutes after treatment (to make sure it is greater than 70 mg/dL). If your blood sugar is not greater than 70 mg/dL on recheck, call [phone] o  for further instructions. . Report your blood sugar to the short stay nurse when you get to Short Stay.  . If you are admitted to the hospital after surgery: o Your blood sugar will be checked by the staff and you will probably be given insulin after surgery (instead of oral diabetes medicines) to make sure you have good blood sugar levels. o The goal for blood sugar control after surgery is 80-180 mg/dL.  WHAT DO I DO ABOUT MY DIABETES MEDICATION?  . THE NIGHT BEFORE SURGERY, take 20 units of Insulin Glargine (LANTUS)      . THE MORNING OF SURGERY, take 20 units of Insulin Glargine (LANTUS) only  if CBG is greater than 70.  Reviewed and Endorsed by [facility] Patient Education Committee, August 2015  Do not wear jewelry, make-up or nail polish.  Do not wear lotions, powders, or perfumes, or deodorant.  Do not shave 48 hours prior to surgery.  Men may shave face and neck.  Do not bring valuables to the hospital.  [facility] is not responsible for any belongings or valuables.  Contacts, dentures or bridgework may not be worn into surgery.  Leave your suitcase in the car.  After surgery it may be brought to your room.  For patients admitted to the hospital, discharge time will be determined by your treatment team.  Patients discharged the day of surgery will not be allowed to drive  home.   Special instructions:  See " [facility] Preparing For Surgery " sheet.  Please read over the following fact sheets that you were given. Pain Booklet, Coughing and Deep Breathing, MRSA Information and Surgical Site Infection Prevention

## 2019-04-08 ENCOUNTER — Ambulatory Visit (HOSPITAL_COMMUNITY)
Admission: RE | Admit: 2019-04-08 | Discharge: 2019-04-08 | Disposition: A | Discharge status: Home / Self Care | Payer: 59 | Source: Ambulatory Visit | Attending: Neurological Surgery | Admitting: Neurological Surgery

## 2019-04-08 ENCOUNTER — Encounter (HOSPITAL_COMMUNITY)
Admission: RE | Admit: 2019-04-08 | Discharge: 2019-04-08 | Disposition: A | Discharge status: Home / Self Care | Payer: 59 | Source: Ambulatory Visit | Attending: Neurological Surgery | Admitting: Neurological Surgery

## 2019-04-08 ENCOUNTER — Encounter (HOSPITAL_COMMUNITY): Payer: Self-pay

## 2019-04-08 ENCOUNTER — Other Ambulatory Visit: Payer: Self-pay

## 2019-04-08 DIAGNOSIS — I10 Essential (primary) hypertension: Secondary | ICD-10-CM | POA: Diagnosis not present

## 2019-04-08 DIAGNOSIS — E119 Type 2 diabetes mellitus without complications: Secondary | ICD-10-CM | POA: Diagnosis not present

## 2019-04-08 DIAGNOSIS — J439 Emphysema, unspecified: Secondary | ICD-10-CM | POA: Diagnosis not present

## 2019-04-08 DIAGNOSIS — M1990 Unspecified osteoarthritis, unspecified site: Secondary | ICD-10-CM | POA: Diagnosis not present

## 2019-04-08 DIAGNOSIS — R5382 Chronic fatigue, unspecified: Secondary | ICD-10-CM | POA: Diagnosis not present

## 2019-04-08 DIAGNOSIS — Z01818 Encounter for other preprocedural examination: Secondary | ICD-10-CM | POA: Diagnosis not present

## 2019-04-08 DIAGNOSIS — M48061 Spinal stenosis, lumbar region without neurogenic claudication: Secondary | ICD-10-CM | POA: Insufficient documentation

## 2019-04-08 DIAGNOSIS — Z6828 Body mass index (BMI) 28.0-28.9, adult: Secondary | ICD-10-CM | POA: Diagnosis not present

## 2019-04-08 DIAGNOSIS — M545 Low back pain: Secondary | ICD-10-CM | POA: Diagnosis not present

## 2019-04-08 DIAGNOSIS — F5221 Male erectile disorder: Secondary | ICD-10-CM | POA: Diagnosis not present

## 2019-04-08 DIAGNOSIS — E1165 Type 2 diabetes mellitus with hyperglycemia: Secondary | ICD-10-CM | POA: Diagnosis not present

## 2019-04-08 HISTORY — DX: Cardiac murmur, unspecified: R01.1

## 2019-04-08 LAB — TYPE AND SCREEN
ABO/RH(D): O POS
Antibody Screen: NEGATIVE

## 2019-04-08 LAB — HEMOGLOBIN A1C
Hgb A1c MFr Bld: 9.8 % — ABNORMAL HIGH (ref 4.8–5.6)
Mean Plasma Glucose: 234.56 mg/dL

## 2019-04-08 LAB — BASIC METABOLIC PANEL
Anion gap: 10 (ref 5–15)
BUN: 6 mg/dL (ref 6–20)
CO2: 26 mmol/L (ref 22–32)
Calcium: 9.7 mg/dL (ref 8.9–10.3)
Chloride: 99 mmol/L (ref 98–111)
Creatinine, Ser: 0.96 mg/dL (ref 0.61–1.24)
GFR calc Af Amer: >60 mL/min (ref >60)
GFR calc non Af Amer: >60 mL/min (ref >60)
Glucose, Bld: 311 mg/dL — ABNORMAL HIGH (ref 70–99)
Potassium: 4.8 mmol/L (ref 3.5–5.1)
Sodium: 135 mmol/L (ref 135–145)

## 2019-04-08 LAB — CBC WITH DIFFERENTIAL/PLATELET
Abs Immature Granulocytes: 0.03 10*3/uL (ref 0.00–0.07)
Basophils Absolute: 0.1 10*3/uL (ref 0.0–0.1)
Basophils Relative: 1 %
Eosinophils Absolute: 0.2 10*3/uL (ref 0.0–0.5)
Eosinophils Relative: 2 %
HCT: 51.1 % (ref 39.0–52.0)
Hemoglobin: 16.7 g/dL (ref 13.0–17.0)
Immature Granulocytes: 0 %
Lymphocytes Relative: 22 %
Lymphs Abs: 2.1 10*3/uL (ref 0.7–4.0)
MCH: 29.5 pg (ref 26.0–34.0)
MCHC: 32.7 g/dL (ref 30.0–36.0)
MCV: 90.1 fL (ref 80.0–100.0)
Monocytes Absolute: 0.7 10*3/uL (ref 0.1–1.0)
Monocytes Relative: 7 %
Neutro Abs: 6.4 10*3/uL (ref 1.7–7.7)
Neutrophils Relative %: 68 %
Platelets: 234 10*3/uL (ref 150–400)
RBC: 5.67 MIL/uL (ref 4.22–5.81)
RDW: 12.7 % (ref 11.5–15.5)
WBC: 9.4 10*3/uL (ref 4.0–10.5)
nRBC: 0 % (ref 0.0–0.2)

## 2019-04-08 LAB — ABO/RH: ABO/RH(D): O POS

## 2019-04-08 LAB — SURGICAL PCR SCREEN
MRSA, PCR: NEGATIVE
Staphylococcus aureus: NEGATIVE

## 2019-04-08 LAB — GLUCOSE, CAPILLARY: Glucose-Capillary: 348 mg/dL — ABNORMAL HIGH (ref 70–99)

## 2019-04-08 LAB — PROTIME-INR
INR: 0.9 (ref 0.8–1.2)
Prothrombin Time: 12.3 seconds (ref 11.4–15.2)

## 2019-04-08 NOTE — Progress Notes (Signed)
Several unsuccessful attempts made to fax request for Echo from Dr. Maren Beach office.

## 2019-04-08 NOTE — Progress Notes (Signed)
PCP - Rory Percy, MD Cardiologist - Denies Pain Specialist- Suella Broad, MD  PPM/ICD - Denies  Chest x-ray - 04/08/2019 EKG - 04/08/2019 Stress Test - Denies ECHO - Patient states ~ 2003, but that results were negative with Dr. Lia Foyer, Echo requested Cardiac Cath - Denies  Sleep Study - Yes CPAP - No  Fasting Blood Sugar - 140 Checks Blood Sugar three times a week  Blood Thinner Instructions: N/A Aspirin Instructions: Per patient, stop 7 days prior to surgery. Last dose 04/10/2019  ERAS Protcol - No PRE-SURGERY Ensure or G2- N/A  COVID TEST- 04/12/2018   Anesthesia review: Yes, Echo requested; cardiac hx.  Patient denies shortness of breath, fever, cough and chest pain at PAT appointment  Coronavirus Screening  Have you experienced the following symptoms:  Cough yes/no: No Fever (>100.37F)  yes/no: No Runny nose yes/no: No Sore throat yes/no: No Difficulty breathing/shortness of breath  yes/no: No  Have you or a family member traveled in the last 14 days and where? yes/no: No   If the patient indicates "YES" to the above questions, their PAT will be rescheduled to limit the exposure to others and, the surgeon will be notified. THE PATIENT WILL NEED TO BE ASYMPTOMATIC FOR 14 DAYS.   If the patient is not experiencing any of these symptoms, the PAT nurse will instruct them to NOT bring anyone with them to their appointment since they may have these symptoms or traveled as well.   Please remind your patients and families that hospital visitation restrictions are in effect and the importance of the restrictions.    All instructions explained to the patient, with a verbal understanding of the material. Patient agrees to go over the instructions while at home for a better understanding. Patient also instructed to self quarantine after being tested for COVID-19. The opportunity to ask questions was provided.

## 2019-04-08 NOTE — Progress Notes (Signed)
Patient's CBG during PAT visit was 348. Patient stated he had a piece of pie this morning.

## 2019-04-13 ENCOUNTER — Other Ambulatory Visit (HOSPITAL_COMMUNITY)
Admission: RE | Admit: 2019-04-13 | Discharge: 2019-04-13 | Disposition: A | Discharge status: Home / Self Care | Payer: 59 | Source: Ambulatory Visit | Attending: Neurological Surgery | Admitting: Neurological Surgery

## 2019-04-13 DIAGNOSIS — Z01812 Encounter for preprocedural laboratory examination: Secondary | ICD-10-CM | POA: Insufficient documentation

## 2019-04-13 DIAGNOSIS — Z20822 Contact with and (suspected) exposure to covid-19: Secondary | ICD-10-CM | POA: Diagnosis not present

## 2019-04-15 LAB — NOVEL CORONAVIRUS, NAA (HOSP ORDER, SEND-OUT TO REF LAB; TAT 18-24 HRS): SARS-CoV-2, NAA: NOT DETECTED

## 2019-04-16 NOTE — Progress Notes (Signed)
Anesthesia Chart Review:  History of poorly controlled IDDMII. At PAT, CBG 348 and A1c 9.8. Remainder of preop labs WNL. Dr. Ronnald Ramp notified of poor BG control. Understands uncontrolled BG on DOS could be cause for cancellation.   All chronic medical conditions followed by PCP. Last seen 04/08/19 and discussed upcoming surgery and need for improved BG control, short acting meal time insulin was added. Clearance from PCP Dr. Rory Percy dated 04/13/19 states pt is moderate risk, copy on chart.  EKG 04/14/18: Sinus bradycardia. Rate 53. Inferior infarct , age undetermined. No significant change from previous tracings 2019 and 2002.    Wynonia Musty [facility] Short Stay Center/Anesthesiology Phone [phone] 04/16/2019 11:11 AM

## 2019-04-17 ENCOUNTER — Ambulatory Visit (HOSPITAL_COMMUNITY): Payer: 59

## 2019-04-17 ENCOUNTER — Ambulatory Visit (HOSPITAL_COMMUNITY): Payer: 59 | Admitting: Physician Assistant

## 2019-04-17 ENCOUNTER — Ambulatory Visit (HOSPITAL_COMMUNITY): Payer: 59 | Admitting: Anesthesiology

## 2019-04-17 ENCOUNTER — Inpatient Hospital Stay (HOSPITAL_COMMUNITY)
Admission: RE | Admit: 2019-04-17 | Discharge: 2019-04-17 | DRG: 455 | Disposition: A | Discharge status: Home / Self Care | Payer: 59 | Attending: Neurological Surgery | Admitting: Neurological Surgery

## 2019-04-17 ENCOUNTER — Encounter (HOSPITAL_COMMUNITY)
Admission: RE | Disposition: A | Discharge status: Home / Self Care | Payer: Self-pay | Source: Home / Self Care | Attending: Neurological Surgery

## 2019-04-17 ENCOUNTER — Other Ambulatory Visit: Payer: Self-pay

## 2019-04-17 ENCOUNTER — Encounter (HOSPITAL_COMMUNITY): Payer: Self-pay | Admitting: Neurological Surgery

## 2019-04-17 DIAGNOSIS — J449 Chronic obstructive pulmonary disease, unspecified: Secondary | ICD-10-CM | POA: Diagnosis not present

## 2019-04-17 DIAGNOSIS — F17210 Nicotine dependence, cigarettes, uncomplicated: Secondary | ICD-10-CM | POA: Diagnosis not present

## 2019-04-17 DIAGNOSIS — G4730 Sleep apnea, unspecified: Secondary | ICD-10-CM | POA: Diagnosis present

## 2019-04-17 DIAGNOSIS — M1990 Unspecified osteoarthritis, unspecified site: Secondary | ICD-10-CM | POA: Diagnosis present

## 2019-04-17 DIAGNOSIS — Z7982 Long term (current) use of aspirin: Secondary | ICD-10-CM | POA: Diagnosis not present

## 2019-04-17 DIAGNOSIS — Z419 Encounter for procedure for purposes other than remedying health state, unspecified: Secondary | ICD-10-CM

## 2019-04-17 DIAGNOSIS — M48061 Spinal stenosis, lumbar region without neurogenic claudication: Secondary | ICD-10-CM | POA: Diagnosis not present

## 2019-04-17 DIAGNOSIS — R011 Cardiac murmur, unspecified: Secondary | ICD-10-CM | POA: Diagnosis present

## 2019-04-17 DIAGNOSIS — I252 Old myocardial infarction: Secondary | ICD-10-CM

## 2019-04-17 DIAGNOSIS — Z794 Long term (current) use of insulin: Secondary | ICD-10-CM

## 2019-04-17 DIAGNOSIS — E119 Type 2 diabetes mellitus without complications: Secondary | ICD-10-CM | POA: Diagnosis not present

## 2019-04-17 DIAGNOSIS — Z981 Arthrodesis status: Secondary | ICD-10-CM | POA: Diagnosis not present

## 2019-04-17 DIAGNOSIS — M5136 Other intervertebral disc degeneration, lumbar region: Secondary | ICD-10-CM | POA: Diagnosis not present

## 2019-04-17 DIAGNOSIS — M5416 Radiculopathy, lumbar region: Secondary | ICD-10-CM | POA: Diagnosis not present

## 2019-04-17 LAB — GLUCOSE, CAPILLARY
Glucose-Capillary: 125 mg/dL — ABNORMAL HIGH (ref 70–99)
Glucose-Capillary: 103 mg/dL — ABNORMAL HIGH (ref 70–99)
Glucose-Capillary: 281 mg/dL — ABNORMAL HIGH (ref 70–99)

## 2019-04-17 SURGERY — POSTERIOR LUMBAR FUSION 1 LEVEL
Anesthesia: General | Site: Back

## 2019-04-17 MED ORDER — ARTHREX ANGEL - ACD-A SOLUTION (CHARTING ONLY) OPTIME
TOPICAL | Status: DC | PRN
  Administered 2019-04-17: 10 mL via TOPICAL

## 2019-04-17 MED ORDER — FENTANYL CITRATE (PF) 100 MCG/2ML IJ SOLN
25.0000 ug | INTRAMUSCULAR | Status: DC | PRN
  Administered 2019-04-17 (×2): 50 ug via INTRAVENOUS

## 2019-04-17 MED ORDER — SUGAMMADEX SODIUM 200 MG/2ML IV SOLN
INTRAVENOUS | Status: DC | PRN
  Administered 2019-04-17: 200 mg via INTRAVENOUS

## 2019-04-17 MED ORDER — PROPOFOL 10 MG/ML IV BOLUS
INTRAVENOUS | Status: DC | PRN
  Administered 2019-04-17: 160 mg via INTRAVENOUS

## 2019-04-17 MED ORDER — SUFENTANIL CITRATE 50 MCG/ML IV SOLN
INTRAVENOUS | Status: AC
  Filled 2019-04-17: qty 1

## 2019-04-17 MED ORDER — THROMBIN 20000 UNITS EX SOLR
CUTANEOUS | Status: DC | PRN
  Administered 2019-04-17: 20 mL via TOPICAL

## 2019-04-17 MED ORDER — CHLORHEXIDINE GLUCONATE CLOTH 2 % EX PADS: 6.0000 | MEDICATED_PAD | Freq: Once | CUTANEOUS | Status: DC

## 2019-04-17 MED ORDER — ASPIRIN EC 81 MG PO TBEC: 81.0000 mg | DELAYED_RELEASE_TABLET | Freq: Every day | ORAL | Status: DC

## 2019-04-17 MED ORDER — LIDOCAINE 2% (20 MG/ML) 5 ML SYRINGE
INTRAMUSCULAR | Status: DC | PRN
  Administered 2019-04-17: 100 mg via INTRAVENOUS

## 2019-04-17 MED ORDER — HEPARIN SODIUM (PORCINE) 1000 UNIT/ML IJ SOLN
INTRAMUSCULAR | Status: DC | PRN
  Administered 2019-04-17: 5000 [IU]

## 2019-04-17 MED ORDER — EPHEDRINE SULFATE-NACL 50-0.9 MG/10ML-% IV SOSY
PREFILLED_SYRINGE | INTRAVENOUS | Status: DC | PRN
  Administered 2019-04-17: 15 mg via INTRAVENOUS

## 2019-04-17 MED ORDER — CEFAZOLIN SODIUM-DEXTROSE 2-4 GM/100ML-% IV SOLN
2.0000 g | Freq: Three times a day (TID) | INTRAVENOUS | Status: DC
  Administered 2019-04-17: 2 g via INTRAVENOUS
  Filled 2019-04-17: qty 100

## 2019-04-17 MED ORDER — OXYCODONE HCL 5 MG/5ML PO SOLN: 5.0000 mg | Freq: Once | ORAL | Status: DC | PRN

## 2019-04-17 MED ORDER — MEPERIDINE HCL 25 MG/ML IJ SOLN: 6.2500 mg | INTRAMUSCULAR | Status: DC | PRN

## 2019-04-17 MED ORDER — TIZANIDINE HCL 4 MG PO TABS
4.0000 mg | ORAL_TABLET | Freq: Three times a day (TID) | ORAL | Status: DC | PRN
  Administered 2019-04-17: 4 mg via ORAL
  Filled 2019-04-17: qty 1

## 2019-04-17 MED ORDER — LACTATED RINGERS IV SOLN: INTRAVENOUS | Status: DC

## 2019-04-17 MED ORDER — MORPHINE SULFATE (PF) 2 MG/ML IV SOLN
2.0000 mg | INTRAVENOUS | Status: DC | PRN
  Administered 2019-04-17: 2 mg via INTRAVENOUS
  Filled 2019-04-17: qty 1

## 2019-04-17 MED ORDER — OXYCODONE HCL 5 MG PO TABS: 5.0000 mg | ORAL_TABLET | Freq: Once | ORAL | Status: DC | PRN

## 2019-04-17 MED ORDER — OXYCODONE HCL 5 MG PO TABS
10.0000 mg | ORAL_TABLET | ORAL | Status: DC | PRN
  Administered 2019-04-17: 10 mg via ORAL
  Filled 2019-04-17: qty 2

## 2019-04-17 MED ORDER — SUFENTANIL CITRATE 50 MCG/ML IV SOLN
INTRAVENOUS | Status: DC | PRN
  Administered 2019-04-17: 5 ug via INTRAVENOUS
  Administered 2019-04-17: 20 ug via INTRAVENOUS
  Administered 2019-04-17: 10 ug via INTRAVENOUS

## 2019-04-17 MED ORDER — MENTHOL 3 MG MT LOZG: 1.0000 | LOZENGE | OROMUCOSAL | Status: DC | PRN

## 2019-04-17 MED ORDER — 0.9 % SODIUM CHLORIDE (POUR BTL) OPTIME
TOPICAL | Status: DC | PRN
  Administered 2019-04-17: 1000 mL

## 2019-04-17 MED ORDER — ONDANSETRON HCL 4 MG/2ML IJ SOLN
INTRAMUSCULAR | Status: AC
  Filled 2019-04-17: qty 2

## 2019-04-17 MED ORDER — TRAZODONE HCL 50 MG PO TABS
50.0000 mg | ORAL_TABLET | Freq: Every day | ORAL | Status: DC
  Filled 2019-04-17: qty 1

## 2019-04-17 MED ORDER — GABAPENTIN 600 MG PO TABS
600.0000 mg | ORAL_TABLET | Freq: Three times a day (TID) | ORAL | Status: DC
  Administered 2019-04-17: 600 mg via ORAL
  Filled 2019-04-17: qty 1

## 2019-04-17 MED ORDER — POTASSIUM CHLORIDE IN NACL 20-0.9 MEQ/L-% IV SOLN: INTRAVENOUS | Status: DC

## 2019-04-17 MED ORDER — CEFAZOLIN SODIUM-DEXTROSE 2-4 GM/100ML-% IV SOLN
2.0000 g | INTRAVENOUS | Status: AC
  Administered 2019-04-17: 2 g via INTRAVENOUS
  Filled 2019-04-17: qty 100

## 2019-04-17 MED ORDER — ONDANSETRON HCL 4 MG/2ML IJ SOLN
INTRAMUSCULAR | Status: DC | PRN
  Administered 2019-04-17: 4 mg via INTRAVENOUS

## 2019-04-17 MED ORDER — EPHEDRINE 5 MG/ML INJ
INTRAVENOUS | Status: AC
  Filled 2019-04-17: qty 10

## 2019-04-17 MED ORDER — PROPOFOL 10 MG/ML IV BOLUS
INTRAVENOUS | Status: AC
  Filled 2019-04-17: qty 20

## 2019-04-17 MED ORDER — ACETAMINOPHEN 650 MG RE SUPP: 650.0000 mg | RECTAL | Status: DC | PRN

## 2019-04-17 MED ORDER — BUPIVACAINE HCL (PF) 0.25 % IJ SOLN
INTRAMUSCULAR | Status: DC | PRN
  Administered 2019-04-17: 5 mL

## 2019-04-17 MED ORDER — MIDAZOLAM HCL 5 MG/5ML IJ SOLN
INTRAMUSCULAR | Status: DC | PRN
  Administered 2019-04-17: 2 mg via INTRAVENOUS

## 2019-04-17 MED ORDER — SODIUM CHLORIDE 0.9 % IV SOLN
INTRAVENOUS | Status: DC | PRN
  Administered 2019-04-17: 500 mL

## 2019-04-17 MED ORDER — CELECOXIB 200 MG PO CAPS
200.0000 mg | ORAL_CAPSULE | Freq: Two times a day (BID) | ORAL | Status: DC
  Administered 2019-04-17: 200 mg via ORAL
  Filled 2019-04-17: qty 1

## 2019-04-17 MED ORDER — INSULIN ASPART 100 UNIT/ML ~~LOC~~ SOLN
0.0000 [IU] | Freq: Three times a day (TID) | SUBCUTANEOUS | Status: DC
  Administered 2019-04-17: 8 [IU] via SUBCUTANEOUS

## 2019-04-17 MED ORDER — ROCURONIUM BROMIDE 10 MG/ML (PF) SYRINGE
PREFILLED_SYRINGE | INTRAVENOUS | Status: AC
  Filled 2019-04-17: qty 10

## 2019-04-17 MED ORDER — PHENOL 1.4 % MT LIQD: 1.0000 | OROMUCOSAL | Status: DC | PRN

## 2019-04-17 MED ORDER — FENTANYL CITRATE (PF) 100 MCG/2ML IJ SOLN
INTRAMUSCULAR | Status: AC
  Filled 2019-04-17: qty 2

## 2019-04-17 MED ORDER — BUPIVACAINE LIPOSOME 1.3 % IJ SUSP
20.0000 mL | Freq: Once | INTRAMUSCULAR | Status: DC
  Filled 2019-04-17: qty 20

## 2019-04-17 MED ORDER — LIDOCAINE 2% (20 MG/ML) 5 ML SYRINGE
INTRAMUSCULAR | Status: AC
  Filled 2019-04-17: qty 5

## 2019-04-17 MED ORDER — ACETAMINOPHEN 325 MG PO TABS: 650.0000 mg | ORAL_TABLET | ORAL | Status: DC | PRN

## 2019-04-17 MED ORDER — SENNA 8.6 MG PO TABS: 1.0000 | ORAL_TABLET | Freq: Two times a day (BID) | ORAL | Status: DC

## 2019-04-17 MED ORDER — SODIUM CHLORIDE 0.9% FLUSH: 3.0000 mL | Freq: Two times a day (BID) | INTRAVENOUS | Status: DC

## 2019-04-17 MED ORDER — THROMBIN 20000 UNITS EX SOLR
CUTANEOUS | Status: AC
  Filled 2019-04-17: qty 20000

## 2019-04-17 MED ORDER — MIDAZOLAM HCL 2 MG/2ML IJ SOLN
INTRAMUSCULAR | Status: AC
  Filled 2019-04-17: qty 2

## 2019-04-17 MED ORDER — PHENYLEPHRINE 40 MCG/ML (10ML) SYRINGE FOR IV PUSH (FOR BLOOD PRESSURE SUPPORT)
PREFILLED_SYRINGE | INTRAVENOUS | Status: DC | PRN
  Administered 2019-04-17 (×3): 80 ug via INTRAVENOUS

## 2019-04-17 MED ORDER — PHENYLEPHRINE HCL-NACL 10-0.9 MG/250ML-% IV SOLN
INTRAVENOUS | Status: DC | PRN
  Administered 2019-04-17: 25 ug/min via INTRAVENOUS

## 2019-04-17 MED ORDER — BUPIVACAINE HCL (PF) 0.25 % IJ SOLN
INTRAMUSCULAR | Status: AC
  Filled 2019-04-17: qty 30

## 2019-04-17 MED ORDER — ROCURONIUM BROMIDE 10 MG/ML (PF) SYRINGE
PREFILLED_SYRINGE | INTRAVENOUS | Status: DC | PRN
  Administered 2019-04-17: 50 mg via INTRAVENOUS
  Administered 2019-04-17: 20 mg via INTRAVENOUS
  Administered 2019-04-17: 10 mg via INTRAVENOUS
  Administered 2019-04-17: 20 mg via INTRAVENOUS

## 2019-04-17 MED ORDER — ACETAMINOPHEN 325 MG PO TABS: 325.0000 mg | ORAL_TABLET | ORAL | Status: DC | PRN

## 2019-04-17 MED ORDER — SERTRALINE HCL 50 MG PO TABS: 100.0000 mg | ORAL_TABLET | Freq: Every day | ORAL | Status: DC

## 2019-04-17 MED ORDER — ONDANSETRON HCL 4 MG/2ML IJ SOLN: 4.0000 mg | Freq: Four times a day (QID) | INTRAMUSCULAR | Status: DC | PRN

## 2019-04-17 MED ORDER — THROMBIN 20000 UNITS EX KIT
PACK | CUTANEOUS | Status: AC
  Filled 2019-04-17: qty 1

## 2019-04-17 MED ORDER — DEXAMETHASONE SODIUM PHOSPHATE 10 MG/ML IJ SOLN
10.0000 mg | Freq: Once | INTRAMUSCULAR | Status: AC
  Administered 2019-04-17: 10 mg via INTRAVENOUS
  Filled 2019-04-17: qty 1

## 2019-04-17 MED ORDER — THROMBIN 5000 UNITS EX SOLR
OROMUCOSAL | Status: DC | PRN
  Administered 2019-04-17: 5 mL via TOPICAL

## 2019-04-17 MED ORDER — SODIUM CHLORIDE 0.9 % IJ SOLN
INTRAMUSCULAR | Status: DC | PRN
  Administered 2019-04-17: 5 mL

## 2019-04-17 MED ORDER — SODIUM CHLORIDE 0.9% FLUSH: 3.0000 mL | INTRAVENOUS | Status: DC | PRN

## 2019-04-17 MED ORDER — BUPIVACAINE LIPOSOME 1.3 % IJ SUSP
INTRAMUSCULAR | Status: DC | PRN
  Administered 2019-04-17: 20 mL

## 2019-04-17 MED ORDER — SODIUM CHLORIDE 0.9 % IV SOLN: 250.0000 mL | INTRAVENOUS | Status: DC

## 2019-04-17 MED ORDER — ACETAMINOPHEN 160 MG/5ML PO SOLN: 325.0000 mg | ORAL | Status: DC | PRN

## 2019-04-17 MED ORDER — ONDANSETRON HCL 4 MG PO TABS: 4.0000 mg | ORAL_TABLET | Freq: Four times a day (QID) | ORAL | Status: DC | PRN

## 2019-04-17 MED ORDER — HEPARIN SODIUM (PORCINE) 1000 UNIT/ML IJ SOLN
INTRAMUSCULAR | Status: AC
  Filled 2019-04-17: qty 1

## 2019-04-17 MED ORDER — NICOTINE 21 MG/24HR TD PT24
21.0000 mg | MEDICATED_PATCH | Freq: Every day | TRANSDERMAL | Status: DC
  Administered 2019-04-17: 21 mg via TRANSDERMAL
  Filled 2019-04-17: qty 1

## 2019-04-17 MED ORDER — ONDANSETRON HCL 4 MG/2ML IJ SOLN: 4.0000 mg | Freq: Once | INTRAMUSCULAR | Status: DC | PRN

## 2019-04-17 MED ORDER — PHENYLEPHRINE 40 MCG/ML (10ML) SYRINGE FOR IV PUSH (FOR BLOOD PRESSURE SUPPORT)
PREFILLED_SYRINGE | INTRAVENOUS | Status: AC
  Filled 2019-04-17: qty 10

## 2019-04-17 MED ORDER — THROMBIN 5000 UNITS EX SOLR
CUTANEOUS | Status: AC
  Filled 2019-04-17: qty 5000

## 2019-04-17 SURGICAL SUPPLY — 61 items
BAG DECANTER FOR FLEXI CONT (MISCELLANEOUS) ×2 IMPLANT
BASKET BONE COLLECTION (BASKET) ×2 IMPLANT
BENZOIN TINCTURE PRP APPL 2/3 (GAUZE/BANDAGES/DRESSINGS) ×2 IMPLANT
BUR CARBIDE MATCH 3.0 (BURR) ×2 IMPLANT
CAGE POST IBF 12X8D 26/9 (Cage) ×2 IMPLANT
CANISTER SUCT 3000ML PPV (MISCELLANEOUS) ×2 IMPLANT
CARTRIDGE OIL MAESTRO DRILL (MISCELLANEOUS) ×1 IMPLANT
CONT SPEC 4OZ CLIKSEAL STRL BL (MISCELLANEOUS) ×2 IMPLANT
COVER BACK TABLE 60X90IN (DRAPES) ×2 IMPLANT
DERMABOND ADVANCED (GAUZE/BANDAGES/DRESSINGS) ×1
DERMABOND ADVANCED .7 DNX12 (GAUZE/BANDAGES/DRESSINGS) ×1 IMPLANT
DIFFUSER DRILL AIR PNEUMATIC (MISCELLANEOUS) ×2 IMPLANT
DRAPE C-ARM 42X72 X-RAY (DRAPES) ×4 IMPLANT
DRAPE C-ARMOR (DRAPES) ×1 IMPLANT
DRAPE LAPAROTOMY 100X72X124 (DRAPES) ×2 IMPLANT
DRAPE SURG 17X23 STRL (DRAPES) ×2 IMPLANT
DURAPREP 26ML APPLICATOR (WOUND CARE) ×2 IMPLANT
ELECT REM PT RETURN 9FT ADLT (ELECTROSURGICAL) ×2
ELECTRODE REM PT RTRN 9FT ADLT (ELECTROSURGICAL) ×1 IMPLANT
EVACUATOR 1/8 PVC DRAIN (DRAIN) ×3 IMPLANT
GLOVE BIO SURGEON STRL SZ 6.5 (GLOVE) ×2 IMPLANT
GLOVE BIO SURGEON STRL SZ7 (GLOVE) ×2 IMPLANT
GLOVE BIO SURGEON STRL SZ8 (GLOVE) ×4 IMPLANT
GLOVE BIOGEL PI IND STRL 7.0 (GLOVE) IMPLANT
GLOVE BIOGEL PI IND STRL 7.5 (GLOVE) IMPLANT
GLOVE BIOGEL PI INDICATOR 7.0 (GLOVE) ×2
GLOVE BIOGEL PI INDICATOR 7.5 (GLOVE) ×4
GLOVE ECLIPSE 7.5 STRL STRAW (GLOVE) ×1 IMPLANT
GLOVE SURG SS PI 6.5 STRL IVOR (GLOVE) ×1 IMPLANT
GLOVE SURG SS PI 7.5 STRL IVOR (GLOVE) ×1 IMPLANT
GOWN STRL REUS W/ TWL LRG LVL3 (GOWN DISPOSABLE) IMPLANT
GOWN STRL REUS W/ TWL XL LVL3 (GOWN DISPOSABLE) ×2 IMPLANT
GOWN STRL REUS W/TWL 2XL LVL3 (GOWN DISPOSABLE) IMPLANT
GOWN STRL REUS W/TWL LRG LVL3 (GOWN DISPOSABLE) ×2
GOWN STRL REUS W/TWL XL LVL3 (GOWN DISPOSABLE) ×5
HEMOSTAT POWDER KIT SURGIFOAM (HEMOSTASIS) ×1 IMPLANT
KIT BASIN OR (CUSTOM PROCEDURE TRAY) ×2 IMPLANT
KIT BONE MRW ASP ANGEL CPRP (KITS) ×1 IMPLANT
KIT TURNOVER KIT B (KITS) ×2 IMPLANT
NDL HYPO 21X1 ECLIPSE (NEEDLE) IMPLANT
NDL HYPO 25X1 1.5 SAFETY (NEEDLE) ×1 IMPLANT
NEEDLE HYPO 21X1 ECLIPSE (NEEDLE) ×2 IMPLANT
NEEDLE HYPO 25X1 1.5 SAFETY (NEEDLE) ×2 IMPLANT
NS IRRIG 1000ML POUR BTL (IV SOLUTION) ×2 IMPLANT
OIL CARTRIDGE MAESTRO DRILL (MISCELLANEOUS)
PACK LAMINECTOMY NEURO (CUSTOM PROCEDURE TRAY) ×2 IMPLANT
PUTTY DBM ALLOSYNC PURE 10CC (Putty) ×1 IMPLANT
ROD PRE LORDOSED 5.5X45 (Rod) ×2 IMPLANT
SCREW POLYAXIAL 7X45MM (Screw) ×4 IMPLANT
SCREW SET SINGLE INNER (Screw) ×4 IMPLANT
SPONGE SURGIFOAM ABS GEL 100 (HEMOSTASIS) ×2 IMPLANT
STRIP CLOSURE SKIN 1/2X4 (GAUZE/BANDAGES/DRESSINGS) ×3 IMPLANT
SUT VIC AB 0 CT1 18XCR BRD8 (SUTURE) ×1 IMPLANT
SUT VIC AB 0 CT1 8-18 (SUTURE) ×1
SUT VIC AB 2-0 CP2 18 (SUTURE) ×3 IMPLANT
SUT VIC AB 3-0 SH 8-18 (SUTURE) ×4 IMPLANT
SYR 20ML LL LF (SYRINGE) ×1 IMPLANT
TOWEL GREEN STERILE (TOWEL DISPOSABLE) ×2 IMPLANT
TOWEL GREEN STERILE FF (TOWEL DISPOSABLE) ×2 IMPLANT
TRAY FOLEY MTR SLVR 16FR STAT (SET/KITS/TRAYS/PACK) ×2 IMPLANT
WATER STERILE IRR 1000ML POUR (IV SOLUTION) ×2 IMPLANT

## 2019-04-17 NOTE — Evaluation (Signed)
Physical Therapy Evaluation Patient Details Name: Miguel [name] MRN: [id number] DOB: 15-Mar-1961 Today's Date: 04/17/2019   History of Present Illness  Patient is a 59 y/o male admitted with Adjacent level stenosis L3-4, recurrent with recurrent right leg pain, previous L4-5 fusion in the remote past now s/p L3-4 decompression and PLIF with removal of fixation L4-5.  Clinical Impression  Patient presents with mobility at supervision level and wife able to assist upon d/c.  Educated in all precautions with handout given and pt able to don brace.  Discussed car transfers and mobility for progression of activity.  No further skilled PT needs at this time.  Will sign off and pt stable for d/c.    Follow Up Recommendations No PT follow up    Equipment Recommendations  None recommended by PT    Recommendations for Other Services       Precautions / Restrictions Precautions Precautions: Back Precaution Booklet Issued: Yes (comment) Precaution Comments: reviewed precautions with patient on handout and with mobility Required Braces or Orthoses: Spinal Brace Spinal Brace: Lumbar corset;Applied in sitting position Restrictions Weight Bearing Restrictions: No      Mobility  Bed Mobility Overal bed mobility: Needs Assistance Bed Mobility: Rolling;Sidelying to Sit Rolling: Supervision Sidelying to sit: Supervision;HOB elevated       General bed mobility comments: cues for technique, has adjustable bed at home  Transfers Overall transfer level: Needs assistance Equipment used: None Transfers: Sit to/from Stand Sit to Stand: Supervision         General transfer comment: increased time and cues for technique  Ambulation/Gait Ambulation/Gait assistance: Supervision Gait Distance (Feet): 200 Feet Assistive device: None Gait Pattern/deviations: Step-through pattern;Decreased stride length     General Gait Details: occasionally supported by wall in hallway on R, but no LOB or  physical help needed, reports has walking stick at home  Stairs            Wheelchair Mobility    Modified Rankin (Stroke Patients Only)       Balance Overall balance assessment: Mild deficits observed, not formally tested;Independent                                           Pertinent Vitals/Pain Pain Assessment: 0-10 Pain Score: 6  Pain Location: incisional Pain Descriptors / Indicators: Burning Pain Intervention(s): Monitored during session;Repositioned    Home Living Family/patient expects to be discharged to:: Private residence   Available Help at Discharge: Family Type of Home: House Home Access: Level entry     Home Layout: One level Home Equipment: Cane - single point;Walker - 2 wheels      Prior Function Level of Independence: Independent               Hand Dominance        Extremity/Trunk Assessment   Upper Extremity Assessment Upper Extremity Assessment: LUE deficits/detail LUE Deficits / Details: reports recent shoulder surgery without much improvement, not formally tested    Lower Extremity Assessment Lower Extremity Assessment: RLE deficits/detail RLE Deficits / Details: WFL AROM/strength with some tingling on foot compared to L RLE Sensation: decreased light touch       Communication   Communication: No difficulties  Cognition Arousal/Alertness: Awake/alert Behavior During Therapy: WFL for tasks assessed/performed Overall Cognitive Status: Within Functional Limits for tasks assessed  General Comments General comments (skin integrity, edema, etc.): wife present, supportive and assisting with LB dressing    Exercises     Assessment/Plan    PT Assessment Patent does not need any further PT services  PT Problem List         PT Treatment Interventions      PT Goals (Current goals can be found in the Care Plan section)  Acute Rehab PT Goals PT  Goal Formulation: All assessment and education complete, DC therapy    Frequency     Barriers to discharge        Co-evaluation               AM-PAC PT "6 Clicks" Mobility  Outcome Measure Help needed turning from your back to your side while in a flat bed without using bedrails?: None Help needed moving from lying on your back to sitting on the side of a flat bed without using bedrails?: None Help needed moving to and from a bed to a chair (including a wheelchair)?: None Help needed standing up from a chair using your arms (e.g., wheelchair or bedside chair)?: None Help needed to walk in hospital room?: None Help needed climbing 3-5 steps with a railing? : A Little 6 Click Score: 23    End of Session Equipment Utilized During Treatment: Back brace Activity Tolerance: Patient tolerated treatment well Patient left: in bed;with family/visitor present(seated EOB) Nurse Communication: Other (comment)(stable for d/c) PT Visit Diagnosis: Difficulty in walking, not elsewhere classified (R26.2)    Time: [id number] PT Time Calculation (min) (ACUTE ONLY): 20 min   Charges:   PT Evaluation $PT Eval Low Complexity: 1 Low          Magda Kiel, [place] [facility] [phone] 04/17/2019   Reginia Naas 04/17/2019, 4:46 PM

## 2019-04-17 NOTE — Transfer of Care (Signed)
Immediate Anesthesia Transfer of Care Note  Patient: Miguel [name]  Procedure(s) Performed: Posterior Lumbar Interbody Fusion  - Lumbar three-Lumbar four (N/A Back)  Patient Location: PACU  Anesthesia Type:General  Level of Consciousness: drowsy and patient cooperative  Airway & Oxygen Therapy: Patient Spontanous Breathing and Patient connected to nasal cannula oxygen  Post-op Assessment: Report given to RN, Post -op Vital signs reviewed and stable and Patient moving all extremities  Post vital signs: Reviewed and stable  Last Vitals:  Vitals Value Taken Time  BP 131/63 04/17/19 1351  Temp    Pulse 85 04/17/19 1353  Resp 21 04/17/19 1353  SpO2 96 % 04/17/19 1353  Vitals shown include unvalidated device data.  Last Pain:  Vitals:   04/17/19 0842  PainSc: 6          Complications: No apparent anesthesia complications

## 2019-04-17 NOTE — Anesthesia Procedure Notes (Signed)
Procedure Name: Intubation Date/Time: 04/17/2019 10:33 AM Performed by: Moshe Salisbury, CRNA Pre-anesthesia Checklist: Patient identified, Emergency Drugs available, Suction available and Patient being monitored Patient Re-evaluated:Patient Re-evaluated prior to induction Oxygen Delivery Method: Circle System Utilized Preoxygenation: Pre-oxygenation with 100% oxygen Induction Type: IV induction Ventilation: Mask ventilation without difficulty Laryngoscope Size: Mac and 4 Grade View: Grade II Tube type: Oral Tube size: 8.0 mm Number of attempts: 1 Airway Equipment and Method: Stylet Placement Confirmation: ETT inserted through vocal cords under direct vision,  positive ETCO2 and breath sounds checked- equal and bilateral Secured at: 23 cm Tube secured with: Tape Dental Injury: Teeth and Oropharynx as per pre-operative assessment

## 2019-04-17 NOTE — Anesthesia Postprocedure Evaluation (Signed)
Anesthesia Post Note  Patient: Miguel [name]  Procedure(s) Performed: Posterior Lumbar Interbody Fusion  - Lumbar three-Lumbar four (N/A Back)     Patient location during evaluation: PACU Anesthesia Type: General Level of consciousness: awake and alert Pain management: pain level controlled Vital Signs Assessment: post-procedure vital signs reviewed and stable Respiratory status: spontaneous breathing, nonlabored ventilation, respiratory function stable and patient connected to nasal cannula oxygen Cardiovascular status: blood pressure returned to baseline and stable Postop Assessment: no apparent nausea or vomiting Anesthetic complications: no    Last Vitals:  Vitals:   04/17/19 1444 04/17/19 1631  BP: 122/67 119/77  Pulse: 87 80  Resp: 18 18  Temp: 36.8 C 36.7 C  SpO2: 94% 97%    Last Pain:  Vitals:   04/17/19 1631  TempSrc: Oral  PainSc:                  Miguel [name]

## 2019-04-17 NOTE — Progress Notes (Signed)
Patient discharge to home with family member. Discharge instruction given and verbalized understanding. No signs of distress noted. Vital signs stable. Marland Kitchen

## 2019-04-17 NOTE — Op Note (Signed)
04/17/2019  1:33 PM  PATIENT:  Miguel [name]  58 y.o. male  PRE-OPERATIVE DIAGNOSIS: Adjacent level stenosis L3-4, recurrent with recurrent right leg pain, previous L4-5 fusion in the remote past  POST-OPERATIVE DIAGNOSIS:  same  PROCEDURE:   1. Decompressive lumbar laminectomy hemifacetectomy and foraminotomy L3-4 requiring more work than would be required for a simple exposure of the disk for PLIF in order to adequately decompress the neural elements and address the spinal stenosis 2. Posterior lumbar interbody fusion L3-4 using porous titanium interbody cages packed with morcellized allograft and autograft soaked with a bone marrow aspirate obtained through a separate fascial incision over the left iliac crest 3. Posterior fixation L3-4 using DePuy pedicle screws.  4. Intertransverse arthrodesis L3-4 right using morcellized autograft and allograft. 5.  Removal of nonsegmental fixation L4-5  SURGEON:  Sherley Bounds, MD  ASSISTANTS: Dr. Marcello Moores  ANESTHESIA:  General  EBL: 100 ml  Total I/O In: 1000 [I.V.:1000] Out: 250 [Urine:150; Blood:100]  BLOOD ADMINISTERED:none  DRAINS: none   INDICATION FOR PROCEDURE: This patient presented with severe back and right leg pain. Imaging revealed adjacent level stenosis on the right at L3-4 after previous right L3-4 decompression and right L4-5 fusion by another surgeon. The patient tried a reasonable attempt at conservative medical measures without relief. I recommended decompression and instrumented fusion to address the stenosis as well as the segmental  instability.  Patient understood the risks, benefits, and alternatives and potential outcomes and wished to proceed.  PROCEDURE DETAILS:  The patient was brought to the operating room. After induction of generalized endotracheal anesthesia the patient was rolled into the prone position on chest rolls and all pressure points were padded. The patient's lumbar region was cleaned and then prepped  with DuraPrep and draped in the usual sterile fashion. Anesthesia was injected and then a dorsal midline incision was made and carried down to the lumbosacral fascia. The fascia was opened and the paraspinous musculature was taken down in a subperiosteal fashion to expose L3-4 as well as the previously placed instrumentation. A self-retaining retractor was placed.  I remove the locking caps from the old pedicle screws and remove the rods.  I was then able to remove the L4 pedicle screws.  Intraoperative fluoroscopy confirmed my level, and I started with the iliac crest bone marrow aspirate.  I then dissected in a suprafascial plane to expose the iliac crest.  Opened the fascia and we used a Jamshidi needle to extract 60 cc of bone marrow aspirate from the iliac crest with the assistance of my nurse practitioner.  This was then spun down by [facility] device and 2 to 4 cc of  BMAC was soaked on morselized allograft for later arthrodesis.  I dried the hole with Surgifoam and closed the fascia.  I then turned my attention to the decompression and complete lumbar laminectomies, hemi- facetectomies, and foraminotomies were performed at L3-4 bilaterally.  My nurse practitioner was directly involved in the decompression and exposure of the neural elements. the patient had significant spinal stenosis and this required more work than would be required for a simple exposure of the disc for posterior lumbar interbody fusion which would only require a limited laminotomy. Much more generous decompression and generous foraminotomy was undertaken in order to adequately decompress the neural elements and address the patient's leg pain. The yellow ligament was removed to expose the underlying dura and nerve roots, and generous foraminotomies were performed to adequately decompress the neural elements. Both the exiting and  traversing nerve roots were decompressed on both sides until a coronary dilator passed easily along the nerve roots.  Once the decompression was complete, I turned my attention to the posterior lower lumbar interbody fusion. The epidural venous vasculature was coagulated and cut sharply. Disc space was incised and the initial discectomy was performed with pituitary rongeurs. The disc space was distracted with sequential distractors to a height of 12 mm. We then used a series of scrapers and shavers to prepare the endplates for fusion. The midline was prepared with Epstein curettes. Once the complete discectomy was finished, we packed an appropriate sized interbody cage with local autograft and morcellized allograft, gently retracted the nerve root, and tapped the cage into position at L3-4.  The midline between the cages was packed with morselized autograft and allograft. We then turned our attention to the placement of the  pedicle screws. The pedicle screw entry zones were identified utilizing surface landmarks and fluoroscopy. I probed into each pedicle utilizing the pedicle probe, and tapped each pedicle with the appropriate tap. We palpated with a ball probe to assure no break in the cortex. We then placed 7.0 x 45 mm DePuy pedicle screws into the pedicles bilaterally at L3.  We then replaced the old pedicle screws at L4 with new DePuy 7.0 x 45 mm pedicle screws. my nurse practitioner assisted in placement of the pedicle screws.  We then decorticated the transverse processes and laid a mixture of morcellized autograft and allograft out over these to perform intertransverse arthrodesis at L3-4 on the right. We then placed lordotic rods into the multiaxial screw heads of the pedicle screws and locked these in position with the locking caps and anti-torque device. We then checked our construct with AP and lateral fluoroscopy. Irrigated with copious amounts of bacitracin-containing saline solution. Inspected the nerve roots once again to assure adequate decompression, lined to the dura with Gelfoam, placed bilateral Hemovac drains  on each side of the spinous process, and then we closed the muscle and the fascia with 0 Vicryl. Closed the subcutaneous tissues with 2-0 Vicryl and subcuticular tissues with 3-0 Vicryl. The skin was closed with benzoin and Steri-Strips. Dressing was then applied, the patient was awakened from general anesthesia and transported to the recovery room in stable condition. At the end of the procedure all sponge, needle and instrument counts were correct.   PLAN OF CARE: admit to inpatient  PATIENT DISPOSITION:  PACU - hemodynamically stable.   Delay start of Pharmacological VTE agent (>24hrs) due to surgical blood loss or risk of bleeding:  yes

## 2019-04-17 NOTE — H&P (Signed)
Subjective: Patient is a 59 y.o. male admitted for plif. Onset of symptoms was several months ago, gradually worsening since that time.  The pain is rated severe, and is located at the across the lower back and radiates to legs R>>L. The pain is described as aching and occurs all day. The symptoms have been progressive. Symptoms are exacerbated by exercise. MRI or CT showed adjacent level stenosis   Past Medical History:  Diagnosis Date  . Arthritis   . Diabetes mellitus without complication ([place])   . Heart murmur   . Myocardial infarction ([place]) 2003  . Sleep apnea     Past Surgical History:  Procedure Laterality Date  . BACK SURGERY    . COLONOSCOPY    . HAND SURGERY Right   . SHOULDER ARTHROSCOPY WITH ROTATOR CUFF REPAIR AND SUBACROMIAL DECOMPRESSION Left 07/07/2017   Procedure: Left shoulder arthroscopy, subacromial decompression, mini open rotator cuff repair;  Surgeon: Susa Day, MD;  Location: [place];  Service: Orthopedics;  Laterality: Left;    Prior to Admission medications   Medication Sig Start Date End Date Taking? Authorizing Provider  aspirin EC 81 MG tablet Take 81 mg by mouth daily.   Yes [provider]  gabapentin (NEURONTIN) 600 MG tablet Take 600 mg by mouth 3 (three) times daily.   Yes [provider]  Insulin Glargine (LANTUS SOLOSTAR) 100 UNIT/ML Solostar Pen Inject 40 Units into the skin 2 (two) times daily.   Yes [provider]  Oxycodone HCl 10 MG TABS Take 10 mg by mouth 6 (six) times daily.   Yes [provider]  rosuvastatin (CRESTOR) 5 MG tablet Take 5 mg by mouth 3 (three) times a week.   Yes [provider]  sertraline (ZOLOFT) 100 MG tablet Take 100 mg by mouth at bedtime.   Yes [provider]  sildenafil (REVATIO) 20 MG tablet Take 100 mg by mouth daily as needed (for erectile dysfunction.).    Yes [provider]  tiZANidine (ZANAFLEX) 4 MG tablet Take 4 mg by mouth every 8 (eight) hours  as needed for muscle spasms.   Yes [provider]  traZODone (DESYREL) 50 MG tablet Take 50 mg by mouth at bedtime.   Yes [provider]  varenicline (CHANTIX) 1 MG tablet Take 1 mg by mouth 2 (two) times daily.   Yes [provider]   No Known Allergies  Social History   Tobacco Use  . Smoking status: Current Every Day Smoker    Packs/day: 0.75    Types: Cigarettes  . Smokeless tobacco: Never Used  Substance Use Topics  . Alcohol use: Yes    Comment: occasional    Family History  Problem Relation Age of Onset  . AAA (abdominal aortic aneurysm) Father      Review of Systems  Positive ROS: neg  All other systems have been reviewed and were otherwise negative with the exception of those mentioned in the HPI and as above.  Objective: Vital signs in last 24 hours: Temp:  [98.1 F (36.7 C)] 98.1 F (36.7 C) (01/06 0833) Pulse Rate:  [65] 65 (01/06 0833) Resp:  [19] 19 (01/06 0833) BP: (159)/(80) 159/80 (01/06 0833) SpO2:  [98 %] 98 % (01/06 0833) Weight:  [86.2 kg] 86.2 kg (01/06 0833)  General Appearance: Alert, cooperative, no distress, appears stated age Head: Normocephalic, without obvious abnormality, atraumatic Eyes: PERRL, conjunctiva/corneas clear, EOM's intact    Neck: Supple, symmetrical, trachea midline Back: Symmetric, no curvature, ROM  normal, no CVA tenderness Lungs:  respirations unlabored Heart: Regular rate and rhythm Abdomen: Soft, non-tender Extremities: Extremities normal, atraumatic, no cyanosis or edema Pulses: 2+ and symmetric all extremities Skin: Skin color, texture, turgor normal, no rashes or lesions  NEUROLOGIC:   Mental status: Alert and oriented x4,  no aphasia, good attention span, fund of knowledge, and memory Motor Exam - grossly normal Sensory Exam - grossly normal Reflexes: 1+ Coordination - grossly normal Gait - grossly normal Balance - grossly normal Cranial Nerves: I: smell Not tested  II:  visual acuity  OS: nl    OD: nl  II: visual fields Full to confrontation  II: pupils Equal, round, reactive to light  III,VII: ptosis None  III,IV,VI: extraocular muscles  Full ROM  V: mastication Normal  V: facial light touch sensation  Normal  V,VII: corneal reflex  Present  VII: facial muscle function - upper  Normal  VII: facial muscle function - lower Normal  VIII: hearing Not tested  IX: soft palate elevation  Normal  IX,X: gag reflex Present  XI: trapezius strength  5/5  XI: sternocleidomastoid strength 5/5  XI: neck flexion strength  5/5  XII: tongue strength  Normal    Data Review Lab Results  Component Value Date   WBC 9.4 04/08/2019   HGB 16.7 04/08/2019   HCT 51.1 04/08/2019   MCV 90.1 04/08/2019   PLT 234 04/08/2019   Lab Results  Component Value Date   NA 135 04/08/2019   K 4.8 04/08/2019   CL 99 04/08/2019   CO2 26 04/08/2019   BUN 6 04/08/2019   CREATININE 0.96 04/08/2019   GLUCOSE 311 (H) 04/08/2019   Lab Results  Component Value Date   INR 0.9 04/08/2019    Assessment/Plan:  Estimated body mass index is 25.77 kg/m as calculated from the following:   Height as of this encounter: 6' (1.829 m).   Weight as of this encounter: 86.2 kg. Patient admitted for PLIF L3-4. Patient has failed a reasonable attempt at conservative therapy.  I explained the condition and procedure to the patient and answered any questions.  Patient wishes to proceed with procedure as planned. Understands risks/ benefits and typical outcomes of procedure.   Eustace Moore 04/17/2019 10:15 AM

## 2019-04-17 NOTE — Discharge Instructions (Signed)

## 2019-04-17 NOTE — Plan of Care (Signed)
Patent ready for diacharge . Vital signs stable.

## 2019-04-17 NOTE — Discharge Summary (Signed)
Physician Discharge Summary  Patient ID: Miguel [name] MRN: [id number] DOB/AGE: [birth year] 59 y.o.  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 The patient tolerated the procedure well and was taken to the recovery room and then to the floor in stable condition. The hospital course was routine. There were no complications. The wound remained clean dry and intact. Pt had appropriate back soreness. No complaints of leg pain or new N/T/W. The patient remained afebrile with stable vital signs, and tolerated a regular diet. The patient continued to increase activities, and pain was well controlled with oral pain medications.   Consults: none  Significant Diagnostic Studies:  Results for orders placed or performed during the hospital encounter of 04/17/19  Glucose, capillary  Result Value Ref Range   Glucose-Capillary 125 (H) 70 - 99 mg/dL  Glucose, capillary  Result Value Ref Range   Glucose-Capillary 103 (H) 70 - 99 mg/dL  Glucose, capillary  Result Value Ref Range   Glucose-Capillary 281 (H) 70 - 99 mg/dL   Comment 1 Notify RN    Comment 2 Document in Chart     Chest 2 View  Result Date: 04/08/2019 CLINICAL DATA:  59 year old male with preoperative chest x-ray EXAM: CHEST - 2 VIEW COMPARISON:  10/31/2008 FINDINGS: Cardiomediastinal silhouette within normal limits in size and contour. No evidence of pneumothorax, pleural effusion, or confluent airspace disease. Mildly coarsened interstitial markings similar to the prior. No displaced fracture.  Degenerative changes of the spine. IMPRESSION: Negative for acute cardiopulmonary disease Electronically Signed   By: Corrie Mckusick D.O.   On: 04/08/2019 09:17   DG Lumbar Spine 2-3 Views  Result Date:  04/17/2019 CLINICAL DATA:  Lumbar disc disease. EXAM: LUMBAR SPINE - 2-3 VIEW; DG C-ARM 1-60 MIN COMPARISON:  Lumbar MRI dated 08/18/2016 FINDINGS: AP and lateral views of the lumbar spine demonstrate the patient has undergone interbody and posterior fusion at L3-4. The hardware appears in excellent position. Previous interbody and posterior fusion at L4-5. The posterior rods have been removed at L4-5. Alignment is normal. IMPRESSION: Satisfactory appearance of the lumbar spine after interbody and posterior fusion at L3-4. FLUOROSCOPY TIME:  36 seconds C-arm fluoroscopic images were obtained intraoperatively and submitted for post operative interpretation. Electronically Signed   By: Lorriane Shire M.D.   On: 04/17/2019 13:56   DG C-Arm 1-60 Min  Result Date: 04/17/2019 CLINICAL DATA:  Lumbar disc disease. EXAM: LUMBAR SPINE - 2-3 VIEW; DG C-ARM 1-60 MIN COMPARISON:  Lumbar MRI dated 08/18/2016 FINDINGS: AP and lateral views of the lumbar spine demonstrate the patient has undergone interbody and posterior fusion at L3-4. The hardware appears in excellent position. Previous interbody and posterior fusion at L4-5. The posterior rods have been removed at L4-5. Alignment is normal. IMPRESSION: Satisfactory appearance of the lumbar spine after interbody and posterior fusion at L3-4. FLUOROSCOPY TIME:  36 seconds C-arm fluoroscopic images were obtained intraoperatively and submitted for post operative interpretation. Electronically Signed   By: Lorriane Shire M.D.   On: 04/17/2019 13:56    Antibiotics:  Anti-infectives (From admission, onward)   Start     Dose/Rate Route Frequency Ordered Stop   04/17/19 1800  ceFAZolin (ANCEF) IVPB 2g/100 mL premix     2 g 200 mL/hr over 30 Minutes Intravenous Every 8  hours 04/17/19 1453 04/18/19 0959   04/17/19 1100  bacitracin 50,000 Units in sodium chloride 0.9 % 500 mL irrigation  Status:  Discontinued       As needed 04/17/19 1141 04/17/19 1354   04/17/19 0830   ceFAZolin (ANCEF) IVPB 2g/100 mL premix     2 g 200 mL/hr over 30 Minutes Intravenous On call to O.R. 04/17/19 0823 04/17/19 1024      Discharge Exam: Blood pressure 119/77, pulse 80, temperature 98 F (36.7 C), temperature source Oral, resp. rate 18, height 6' (1.829 m), weight 86.2 kg, SpO2 97 %. Neurologic: Grossly normal Dressing dry  Discharge Medications:   Allergies as of 04/17/2019   No Known Allergies     Medication List    TAKE these medications   aspirin EC 81 MG tablet Take 81 mg by mouth daily.   gabapentin 600 MG tablet Commonly known as: NEURONTIN Take 600 mg by mouth 3 (three) times daily.   Lantus SoloStar 100 UNIT/ML Solostar Pen Generic drug: Insulin Glargine Inject 40 Units into the skin 2 (two) times daily.   Oxycodone HCl 10 MG Tabs Take 10 mg by mouth 6 (six) times daily.   rosuvastatin 5 MG tablet Commonly known as: CRESTOR Take 5 mg by mouth 3 (three) times a week.   sertraline 100 MG tablet Commonly known as: ZOLOFT Take 100 mg by mouth at bedtime.   sildenafil 20 MG tablet Commonly known as: REVATIO Take 100 mg by mouth daily as needed (for erectile dysfunction.).   tiZANidine 4 MG tablet Commonly known as: ZANAFLEX Take 4 mg by mouth every 8 (eight) hours as needed for muscle spasms.   traZODone 50 MG tablet Commonly known as: DESYREL Take 50 mg by mouth at bedtime.   varenicline 1 MG tablet Commonly known as: CHANTIX Take 1 mg by mouth 2 (two) times daily.            Durable Medical Equipment  (From admission, onward)         Start     Ordered   04/17/19 1454  DME Walker rolling  Once    Question:  Patient needs a walker to treat with the following condition  Answer:  S/P lumbar fusion   04/17/19 1453   04/17/19 1454  DME 3 n 1  Once     04/17/19 1453          Disposition: home   Final Dx: PLIF l3-4  Discharge Instructions     Remove dressing in 72 hours   Complete by: As directed    Call MD for:   difficulty breathing, headache or visual disturbances   Complete by: As directed    Call MD for:  persistant nausea and vomiting   Complete by: As directed    Call MD for:  redness, tenderness, or signs of infection (pain, swelling, redness, odor or green/yellow discharge around incision site)   Complete by: As directed    Call MD for:  severe uncontrolled pain   Complete by: As directed    Call MD for:  temperature >100.4   Complete by: As directed    Diet - low sodium heart healthy   Complete by: As directed    Increase activity slowly   Complete by: As directed       Follow-up Information    Eustace Moore, MD. Schedule an appointment as soon as possible for a visit in 2 week(s).   Specialty: Neurosurgery Contact information: [address] [place] [address] [phone]            Signed: Eustace Moore 04/17/2019, 7:13 PM

## 2019-04-17 NOTE — Anesthesia Preprocedure Evaluation (Addendum)
Anesthesia Evaluation  Patient identified by MRN, date of birth, ID band Patient awake    Reviewed: Allergy & Precautions, H&P , NPO status , Patient's Chart, lab work & pertinent test results, reviewed documented beta blocker date and time   Airway Mallampati: II  TM Distance: >3 FB Neck ROM: full    Dental no notable dental hx. (+) Edentulous Upper, Edentulous Lower, Implants,  4 LOWER posts:   Pulmonary sleep apnea , COPD, Current Smoker and Patient abstained from smoking.,    Pulmonary exam normal breath sounds clear to auscultation       Cardiovascular Exercise Tolerance: Good + Past MI   Rhythm:regular Rate:Normal     Neuro/Psych negative neurological ROS  negative psych ROS   GI/Hepatic negative GI ROS, Neg liver ROS,   Endo/Other  negative endocrine ROSdiabetes, Type 2, Insulin Dependent  Renal/GU negative Renal ROS  negative genitourinary   Musculoskeletal  (+) Arthritis , Osteoarthritis,    Abdominal   Peds  Hematology negative hematology ROS (+)   Anesthesia Other Findings   Reproductive/Obstetrics negative OB ROS                            Anesthesia Physical Anesthesia Plan  ASA: III  Anesthesia Plan: General   Post-op Pain Management:    Induction: Intravenous  PONV Risk Score and Plan: 2 and Ondansetron, Treatment may vary due to age or medical condition and Midazolam  Airway Management Planned: Oral ETT  Additional Equipment:   Intra-op Plan:   Post-operative Plan: Extubation in OR  Informed Consent: I have reviewed the patients History and Physical, chart, labs and discussed the procedure including the risks, benefits and alternatives for the proposed anesthesia with the patient or authorized representative who has indicated his/her understanding and acceptance.     Dental Advisory Given  Plan Discussed with: CRNA, Anesthesiologist and  Surgeon  Anesthesia Plan Comments: (  )        Anesthesia Quick Evaluation

## 2019-04-17 NOTE — Evaluation (Addendum)
Occupational Therapy Evaluation Patient Details Name: Miguel [name] MRN: [id number] DOB: 11-09-1960 Today's Date: 04/17/2019    History of Present Illness Patient is a 59 y/o male admitted with Adjacent level stenosis L3-4, recurrent with recurrent right leg pain, previous L4-5 fusion in the remote past now s/p L3-4 decompression and PLIF with removal of fixation L4-5.   Clinical Impression   This 59 y/o male presents with the above. PTA pt reports independence with ADL and functional mobility. Pt demonstrating functional mobility without AD overall at supervision level; he currently requires minA for LB and toileting ADL secondary to adhering to back precautions. Educated both pt/pt's spouse re: back precautions, brace management, AE, safety and compensatory techniques for completing ADL and functional transfers with pt verbalizing understanding. Pt demonstrating good self-monitoring of adhering to precautions during this session. He plans to return home with spouse assist for ADL/iADL PRN. All questions answered with no further acute OT needs identified. Acute OT to sign off, thank you for this referral.     Follow Up Recommendations  No OT follow up;Supervision/Assistance - 24 hour(24hr initially)    Equipment Recommendations  3 in 1 bedside commode           Precautions / Restrictions Precautions Precautions: Back Precaution Booklet Issued: Yes (comment) Precaution Comments: reviewed precautions with patient on handout and with mobility Required Braces or Orthoses: Spinal Brace Spinal Brace: Lumbar corset;Applied in sitting position Restrictions Weight Bearing Restrictions: No      Mobility Bed Mobility Overal bed mobility: Needs Assistance Bed Mobility: Sidelying to Sit;Sit to Sidelying Rolling: Supervision Sidelying to sit: Supervision     Sit to sidelying: Supervision General bed mobility comments: cues for technique, has adjustable bed at home  Transfers Overall  transfer level: Needs assistance Equipment used: None Transfers: Sit to/from Stand Sit to Stand: Supervision         General transfer comment: increased time and cues for technique    Balance Overall balance assessment: Mild deficits observed, not formally tested;Independent                                         ADL either performed or assessed with clinical judgement   ADL Overall ADL's : Needs assistance/impaired Eating/Feeding: Modified independent;Sitting   Grooming: Supervision/safety;Standing   Upper Body Bathing: Supervision/ safety;Sitting   Lower Body Bathing: Min guard;Sit to/from stand   Upper Body Dressing : Set up;Sitting   Lower Body Dressing: Minimal assistance;Sit to/from stand Lower Body Dressing Details (indicate cue type and reason): educated in use of reacher to assist with LB ADL task, pt not able to perform full figure 4, reports also has assist from spouse Toilet Transfer: Supervision/safety;Ambulation Toilet Transfer Details (indicate cue type and reason): simulated via transfer to/from EOB Toileting- Clothing Manipulation and Hygiene: Minimal assistance;Sit to/from stand Toileting - Clothing Manipulation Details (indicate cue type and reason): educated in use of toilet aide for pericare, pt spouse Government social research officer Details (indicate cue type and reason): educated to use built in shower seat during  bathing ADL for increased safety and independence with task while maintaining precautions Functional mobility during ADLs: Supervision/safety General ADL Comments: pt self correcting himself during functional tasks with carryover of precautions                         Pertinent Vitals/Pain Pain Assessment:  Faces Pain Score: 6  Faces Pain Scale: Hurts little more Pain Location: incisional Pain Descriptors / Indicators: Burning Pain Intervention(s): Limited activity within patient's tolerance;Monitored during  session;Repositioned     Hand Dominance     Extremity/Trunk Assessment Upper Extremity Assessment Upper Extremity Assessment: LUE deficits/detail LUE Deficits / Details: reports recent shoulder surgery without much improvement, not formally tested   Lower Extremity Assessment Lower Extremity Assessment: Defer to PT evaluation RLE Deficits / Details: [facility] AROM/strength with some tingling on foot compared to L RLE Sensation: decreased light touch   Cervical / Trunk Assessment Cervical / Trunk Assessment: Other exceptions Cervical / Trunk Exceptions: s/p spinal sx   Communication Communication Communication: No difficulties   Cognition Arousal/Alertness: Awake/alert Behavior During Therapy: WFL for tasks assessed/performed Overall Cognitive Status: Within Functional Limits for tasks assessed                                     General Comments  wife present during session    Exercises     Shoulder Instructions      Home Living Family/patient expects to be discharged to:: Private residence   Available Help at Discharge: Family Type of Home: House Home Access: Level entry     Home Layout: One level     Bathroom Shower/Tub: Walk-in Psychologist, prison and probation services: Standard     Home Equipment: Cane - single point;Walker - 2 wheels;Shower seat - built in          Prior Functioning/Environment Level of Independence: Independent                 OT Problem List: Decreased strength;Impaired balance (sitting and/or standing);Decreased activity tolerance;Decreased knowledge of precautions;Decreased knowledge of use of DME or AE;Pain      OT Treatment/Interventions:      OT Goals(Current goals can be found in the care plan section) Acute Rehab OT Goals Patient Stated Goal: home today OT Goal Formulation: All assessment and education complete, DC therapy  OT Frequency:     Barriers to D/C:            Co-evaluation              AM-PAC OT  "6 Clicks" Daily Activity     Outcome Measure Help from another person eating meals?: None Help from another person taking care of personal grooming?: None Help from another person toileting, which includes using toliet, bedpan, or urinal?: A Little Help from another person bathing (including washing, rinsing, drying)?: A Little Help from another person to put on and taking off regular upper body clothing?: None Help from another person to put on and taking off regular lower body clothing?: A Little 6 Click Score: 21   End of Session Nurse Communication: Mobility status  Activity Tolerance: Patient tolerated treatment well Patient left: in bed;with call bell/phone within reach;with family/visitor present  OT Visit Diagnosis: Other abnormalities of gait and mobility (R26.89);Pain Pain - part of body: (back)                Time: [id number] OT Time Calculation (min): 23 min Charges:  OT General Charges $OT Visit: 1 Visit OT Evaluation $OT Eval Low Complexity: [place], OT Supplemental Rehabilitation Services Pager [phone] Office [phone]  Raymondo Band 04/17/2019, 5:18 PM

## 2019-06-01 DIAGNOSIS — G4700 Insomnia, unspecified: Secondary | ICD-10-CM | POA: Diagnosis not present

## 2019-06-01 DIAGNOSIS — M5030 Other cervical disc degeneration, unspecified cervical region: Secondary | ICD-10-CM | POA: Diagnosis not present

## 2019-06-01 DIAGNOSIS — G894 Chronic pain syndrome: Secondary | ICD-10-CM | POA: Diagnosis not present

## 2019-09-17 DIAGNOSIS — G894 Chronic pain syndrome: Secondary | ICD-10-CM | POA: Diagnosis not present

## 2019-09-17 DIAGNOSIS — M5136 Other intervertebral disc degeneration, lumbar region: Secondary | ICD-10-CM | POA: Diagnosis not present

## 2019-09-17 DIAGNOSIS — Z79899 Other long term (current) drug therapy: Secondary | ICD-10-CM | POA: Diagnosis not present

## 2019-09-17 DIAGNOSIS — M5030 Other cervical disc degeneration, unspecified cervical region: Secondary | ICD-10-CM | POA: Diagnosis not present

## 2019-09-17 DIAGNOSIS — M961 Postlaminectomy syndrome, not elsewhere classified: Secondary | ICD-10-CM | POA: Diagnosis not present

## 2019-09-17 DIAGNOSIS — Z5181 Encounter for therapeutic drug level monitoring: Secondary | ICD-10-CM | POA: Diagnosis not present

## 2019-09-17 DIAGNOSIS — Z79891 Long term (current) use of opiate analgesic: Secondary | ICD-10-CM | POA: Diagnosis not present

## 2019-10-08 DIAGNOSIS — M5136 Other intervertebral disc degeneration, lumbar region: Secondary | ICD-10-CM | POA: Diagnosis not present

## 2019-10-08 DIAGNOSIS — M5030 Other cervical disc degeneration, unspecified cervical region: Secondary | ICD-10-CM | POA: Diagnosis not present

## 2019-10-23 DIAGNOSIS — M533 Sacrococcygeal disorders, not elsewhere classified: Secondary | ICD-10-CM | POA: Diagnosis not present

## 2019-11-26 DIAGNOSIS — R9389 Abnormal findings on diagnostic imaging of other specified body structures: Secondary | ICD-10-CM | POA: Diagnosis not present

## 2019-11-26 DIAGNOSIS — R918 Other nonspecific abnormal finding of lung field: Secondary | ICD-10-CM | POA: Diagnosis not present

## 2019-11-26 DIAGNOSIS — J449 Chronic obstructive pulmonary disease, unspecified: Secondary | ICD-10-CM | POA: Diagnosis not present

## 2019-12-05 DIAGNOSIS — M5030 Other cervical disc degeneration, unspecified cervical region: Secondary | ICD-10-CM | POA: Diagnosis not present

## 2019-12-05 DIAGNOSIS — G894 Chronic pain syndrome: Secondary | ICD-10-CM | POA: Diagnosis not present

## 2019-12-05 DIAGNOSIS — Z79891 Long term (current) use of opiate analgesic: Secondary | ICD-10-CM | POA: Diagnosis not present

## 2019-12-05 DIAGNOSIS — M5416 Radiculopathy, lumbar region: Secondary | ICD-10-CM | POA: Diagnosis not present

## 2019-12-05 DIAGNOSIS — M5136 Other intervertebral disc degeneration, lumbar region: Secondary | ICD-10-CM | POA: Diagnosis not present

## 2019-12-05 DIAGNOSIS — M961 Postlaminectomy syndrome, not elsewhere classified: Secondary | ICD-10-CM | POA: Diagnosis not present

## 2019-12-26 DIAGNOSIS — M5416 Radiculopathy, lumbar region: Secondary | ICD-10-CM | POA: Diagnosis not present

## 2020-05-04 DIAGNOSIS — E1142 Type 2 diabetes mellitus with diabetic polyneuropathy: Secondary | ICD-10-CM | POA: Diagnosis not present

## 2020-05-04 DIAGNOSIS — F5221 Male erectile disorder: Secondary | ICD-10-CM | POA: Diagnosis not present

## 2020-06-08 DIAGNOSIS — F5221 Male erectile disorder: Secondary | ICD-10-CM | POA: Diagnosis not present

## 2020-06-08 DIAGNOSIS — E1165 Type 2 diabetes mellitus with hyperglycemia: Secondary | ICD-10-CM | POA: Diagnosis not present

## 2020-06-08 DIAGNOSIS — I152 Hypertension secondary to endocrine disorders: Secondary | ICD-10-CM | POA: Diagnosis not present

## 2020-06-08 DIAGNOSIS — E785 Hyperlipidemia, unspecified: Secondary | ICD-10-CM | POA: Diagnosis not present

## 2020-06-08 DIAGNOSIS — E1169 Type 2 diabetes mellitus with other specified complication: Secondary | ICD-10-CM | POA: Diagnosis not present

## 2020-06-10 DIAGNOSIS — E1159 Type 2 diabetes mellitus with other circulatory complications: Secondary | ICD-10-CM | POA: Diagnosis not present

## 2020-06-10 DIAGNOSIS — T466X5A Adverse effect of antihyperlipidemic and antiarteriosclerotic drugs, initial encounter: Secondary | ICD-10-CM | POA: Diagnosis not present

## 2020-06-10 DIAGNOSIS — I152 Hypertension secondary to endocrine disorders: Secondary | ICD-10-CM | POA: Diagnosis not present

## 2020-06-10 DIAGNOSIS — E1169 Type 2 diabetes mellitus with other specified complication: Secondary | ICD-10-CM | POA: Diagnosis not present

## 2020-06-10 DIAGNOSIS — E1165 Type 2 diabetes mellitus with hyperglycemia: Secondary | ICD-10-CM | POA: Diagnosis not present

## 2020-06-10 DIAGNOSIS — E785 Hyperlipidemia, unspecified: Secondary | ICD-10-CM | POA: Diagnosis not present

## 2020-06-10 DIAGNOSIS — M791 Myalgia, unspecified site: Secondary | ICD-10-CM | POA: Diagnosis not present

## 2020-06-10 DIAGNOSIS — F419 Anxiety disorder, unspecified: Secondary | ICD-10-CM | POA: Diagnosis not present

## 2020-06-10 DIAGNOSIS — F5221 Male erectile disorder: Secondary | ICD-10-CM | POA: Diagnosis not present

## 2020-06-18 DIAGNOSIS — M5416 Radiculopathy, lumbar region: Secondary | ICD-10-CM | POA: Diagnosis not present

## 2020-06-18 DIAGNOSIS — M5136 Other intervertebral disc degeneration, lumbar region: Secondary | ICD-10-CM | POA: Diagnosis not present

## 2020-06-18 DIAGNOSIS — Z79891 Long term (current) use of opiate analgesic: Secondary | ICD-10-CM | POA: Diagnosis not present

## 2020-06-27 DIAGNOSIS — M4722 Other spondylosis with radiculopathy, cervical region: Secondary | ICD-10-CM | POA: Diagnosis not present

## 2020-06-27 DIAGNOSIS — M542 Cervicalgia: Secondary | ICD-10-CM | POA: Diagnosis not present

## 2020-06-27 DIAGNOSIS — G894 Chronic pain syndrome: Secondary | ICD-10-CM | POA: Diagnosis not present

## 2020-06-27 DIAGNOSIS — M5030 Other cervical disc degeneration, unspecified cervical region: Secondary | ICD-10-CM | POA: Diagnosis not present

## 2020-07-16 DIAGNOSIS — M5416 Radiculopathy, lumbar region: Secondary | ICD-10-CM | POA: Diagnosis not present

## 2020-07-17 DIAGNOSIS — M961 Postlaminectomy syndrome, not elsewhere classified: Secondary | ICD-10-CM | POA: Diagnosis not present

## 2020-08-08 DIAGNOSIS — E1159 Type 2 diabetes mellitus with other circulatory complications: Secondary | ICD-10-CM | POA: Diagnosis not present

## 2020-08-08 DIAGNOSIS — I152 Hypertension secondary to endocrine disorders: Secondary | ICD-10-CM | POA: Diagnosis not present

## 2020-08-08 DIAGNOSIS — E1169 Type 2 diabetes mellitus with other specified complication: Secondary | ICD-10-CM | POA: Diagnosis not present

## 2020-08-08 DIAGNOSIS — E785 Hyperlipidemia, unspecified: Secondary | ICD-10-CM | POA: Diagnosis not present

## 2020-08-08 DIAGNOSIS — E1165 Type 2 diabetes mellitus with hyperglycemia: Secondary | ICD-10-CM | POA: Diagnosis not present

## 2020-08-13 DIAGNOSIS — G90529 Complex regional pain syndrome I of unspecified lower limb: Secondary | ICD-10-CM | POA: Diagnosis not present

## 2020-08-14 DIAGNOSIS — I152 Hypertension secondary to endocrine disorders: Secondary | ICD-10-CM | POA: Diagnosis not present

## 2020-08-14 DIAGNOSIS — F5221 Male erectile disorder: Secondary | ICD-10-CM | POA: Diagnosis not present

## 2020-08-14 DIAGNOSIS — E1142 Type 2 diabetes mellitus with diabetic polyneuropathy: Secondary | ICD-10-CM | POA: Diagnosis not present

## 2020-08-14 DIAGNOSIS — E1169 Type 2 diabetes mellitus with other specified complication: Secondary | ICD-10-CM | POA: Diagnosis not present

## 2020-08-14 DIAGNOSIS — E785 Hyperlipidemia, unspecified: Secondary | ICD-10-CM | POA: Diagnosis not present

## 2020-08-14 DIAGNOSIS — E1159 Type 2 diabetes mellitus with other circulatory complications: Secondary | ICD-10-CM | POA: Diagnosis not present

## 2020-08-14 DIAGNOSIS — M791 Myalgia, unspecified site: Secondary | ICD-10-CM | POA: Diagnosis not present

## 2020-08-14 DIAGNOSIS — F330 Major depressive disorder, recurrent, mild: Secondary | ICD-10-CM | POA: Diagnosis not present

## 2020-09-11 DIAGNOSIS — M5416 Radiculopathy, lumbar region: Secondary | ICD-10-CM | POA: Diagnosis not present

## 2020-10-01 DIAGNOSIS — M5412 Radiculopathy, cervical region: Secondary | ICD-10-CM | POA: Diagnosis not present

## 2020-10-01 DIAGNOSIS — M5416 Radiculopathy, lumbar region: Secondary | ICD-10-CM | POA: Diagnosis not present

## 2020-10-16 DIAGNOSIS — M4722 Other spondylosis with radiculopathy, cervical region: Secondary | ICD-10-CM | POA: Diagnosis not present

## 2020-10-16 DIAGNOSIS — G894 Chronic pain syndrome: Secondary | ICD-10-CM | POA: Diagnosis not present

## 2020-10-16 DIAGNOSIS — M542 Cervicalgia: Secondary | ICD-10-CM | POA: Diagnosis not present

## 2020-10-16 DIAGNOSIS — M5412 Radiculopathy, cervical region: Secondary | ICD-10-CM | POA: Diagnosis not present

## 2020-10-16 DIAGNOSIS — M5030 Other cervical disc degeneration, unspecified cervical region: Secondary | ICD-10-CM | POA: Diagnosis not present

## 2020-10-16 DIAGNOSIS — M5416 Radiculopathy, lumbar region: Secondary | ICD-10-CM | POA: Diagnosis not present

## 2020-10-16 DIAGNOSIS — M5136 Other intervertebral disc degeneration, lumbar region: Secondary | ICD-10-CM | POA: Diagnosis not present

## 2020-10-22 DIAGNOSIS — M5412 Radiculopathy, cervical region: Secondary | ICD-10-CM | POA: Diagnosis not present

## 2020-11-03 DIAGNOSIS — M5416 Radiculopathy, lumbar region: Secondary | ICD-10-CM | POA: Diagnosis not present

## 2020-11-03 DIAGNOSIS — R030 Elevated blood-pressure reading, without diagnosis of hypertension: Secondary | ICD-10-CM | POA: Diagnosis not present

## 2020-11-03 DIAGNOSIS — M961 Postlaminectomy syndrome, not elsewhere classified: Secondary | ICD-10-CM | POA: Diagnosis not present

## 2020-11-09 DIAGNOSIS — M5431 Sciatica, right side: Secondary | ICD-10-CM | POA: Diagnosis not present

## 2020-11-09 DIAGNOSIS — M5136 Other intervertebral disc degeneration, lumbar region: Secondary | ICD-10-CM | POA: Diagnosis not present

## 2020-11-09 DIAGNOSIS — M5030 Other cervical disc degeneration, unspecified cervical region: Secondary | ICD-10-CM | POA: Diagnosis not present

## 2020-11-09 DIAGNOSIS — M5416 Radiculopathy, lumbar region: Secondary | ICD-10-CM | POA: Diagnosis not present

## 2020-11-09 DIAGNOSIS — M5412 Radiculopathy, cervical region: Secondary | ICD-10-CM | POA: Diagnosis not present

## 2020-11-09 DIAGNOSIS — M961 Postlaminectomy syndrome, not elsewhere classified: Secondary | ICD-10-CM | POA: Diagnosis not present

## 2020-11-09 DIAGNOSIS — Z79891 Long term (current) use of opiate analgesic: Secondary | ICD-10-CM | POA: Diagnosis not present

## 2020-11-12 DIAGNOSIS — R918 Other nonspecific abnormal finding of lung field: Secondary | ICD-10-CM | POA: Diagnosis not present

## 2020-11-12 DIAGNOSIS — M546 Pain in thoracic spine: Secondary | ICD-10-CM | POA: Diagnosis not present

## 2020-11-12 DIAGNOSIS — F17210 Nicotine dependence, cigarettes, uncomplicated: Secondary | ICD-10-CM | POA: Diagnosis not present

## 2020-11-17 DIAGNOSIS — M5431 Sciatica, right side: Secondary | ICD-10-CM | POA: Diagnosis not present

## 2020-12-02 DIAGNOSIS — M5416 Radiculopathy, lumbar region: Secondary | ICD-10-CM | POA: Diagnosis not present

## 2020-12-02 DIAGNOSIS — M5412 Radiculopathy, cervical region: Secondary | ICD-10-CM | POA: Diagnosis not present

## 2020-12-02 DIAGNOSIS — Z5181 Encounter for therapeutic drug level monitoring: Secondary | ICD-10-CM | POA: Diagnosis not present

## 2020-12-02 DIAGNOSIS — M961 Postlaminectomy syndrome, not elsewhere classified: Secondary | ICD-10-CM | POA: Diagnosis not present

## 2020-12-02 DIAGNOSIS — M542 Cervicalgia: Secondary | ICD-10-CM | POA: Diagnosis not present

## 2020-12-02 DIAGNOSIS — Z79891 Long term (current) use of opiate analgesic: Secondary | ICD-10-CM | POA: Diagnosis not present

## 2020-12-02 DIAGNOSIS — M5136 Other intervertebral disc degeneration, lumbar region: Secondary | ICD-10-CM | POA: Diagnosis not present

## 2020-12-02 DIAGNOSIS — G90529 Complex regional pain syndrome I of unspecified lower limb: Secondary | ICD-10-CM | POA: Diagnosis not present

## 2020-12-02 DIAGNOSIS — Z79899 Other long term (current) drug therapy: Secondary | ICD-10-CM | POA: Diagnosis not present

## 2021-01-06 DIAGNOSIS — M722 Plantar fascial fibromatosis: Secondary | ICD-10-CM | POA: Diagnosis not present

## 2021-01-06 DIAGNOSIS — M79672 Pain in left foot: Secondary | ICD-10-CM | POA: Diagnosis not present

## 2021-01-07 DIAGNOSIS — E039 Hypothyroidism, unspecified: Secondary | ICD-10-CM | POA: Diagnosis not present

## 2021-01-07 DIAGNOSIS — E1165 Type 2 diabetes mellitus with hyperglycemia: Secondary | ICD-10-CM | POA: Diagnosis not present

## 2021-01-07 DIAGNOSIS — R5383 Other fatigue: Secondary | ICD-10-CM | POA: Diagnosis not present

## 2021-01-07 DIAGNOSIS — N529 Male erectile dysfunction, unspecified: Secondary | ICD-10-CM | POA: Diagnosis not present

## 2021-01-07 DIAGNOSIS — E1169 Type 2 diabetes mellitus with other specified complication: Secondary | ICD-10-CM | POA: Diagnosis not present

## 2021-01-07 DIAGNOSIS — I10 Essential (primary) hypertension: Secondary | ICD-10-CM | POA: Diagnosis not present

## 2021-01-11 DIAGNOSIS — R918 Other nonspecific abnormal finding of lung field: Secondary | ICD-10-CM | POA: Diagnosis not present

## 2021-01-11 DIAGNOSIS — N529 Male erectile dysfunction, unspecified: Secondary | ICD-10-CM | POA: Diagnosis not present

## 2021-01-11 DIAGNOSIS — F17210 Nicotine dependence, cigarettes, uncomplicated: Secondary | ICD-10-CM | POA: Diagnosis not present

## 2021-01-11 DIAGNOSIS — E7801 Familial hypercholesterolemia: Secondary | ICD-10-CM | POA: Diagnosis not present

## 2021-01-11 DIAGNOSIS — E1165 Type 2 diabetes mellitus with hyperglycemia: Secondary | ICD-10-CM | POA: Diagnosis not present

## 2021-01-11 DIAGNOSIS — I10 Essential (primary) hypertension: Secondary | ICD-10-CM | POA: Diagnosis not present

## 2021-01-20 DIAGNOSIS — R918 Other nonspecific abnormal finding of lung field: Secondary | ICD-10-CM | POA: Diagnosis not present

## 2021-02-03 DIAGNOSIS — M79672 Pain in left foot: Secondary | ICD-10-CM | POA: Diagnosis not present

## 2021-02-03 DIAGNOSIS — M722 Plantar fascial fibromatosis: Secondary | ICD-10-CM | POA: Diagnosis not present

## 2021-02-04 DIAGNOSIS — G894 Chronic pain syndrome: Secondary | ICD-10-CM | POA: Diagnosis not present

## 2021-02-04 DIAGNOSIS — G90529 Complex regional pain syndrome I of unspecified lower limb: Secondary | ICD-10-CM | POA: Diagnosis not present

## 2021-02-04 DIAGNOSIS — M5412 Radiculopathy, cervical region: Secondary | ICD-10-CM | POA: Diagnosis not present

## 2021-02-04 DIAGNOSIS — M542 Cervicalgia: Secondary | ICD-10-CM | POA: Diagnosis not present

## 2021-02-22 IMAGING — CR DG CHEST 2V
2 series · 2 of 2 positions shown · non-contrast
Comparison: 10/31/2008

CLINICAL DATA: 58-year-old male with preoperative chest x-ray

EXAM:
CHEST - 2 VIEW

[w chest pa]
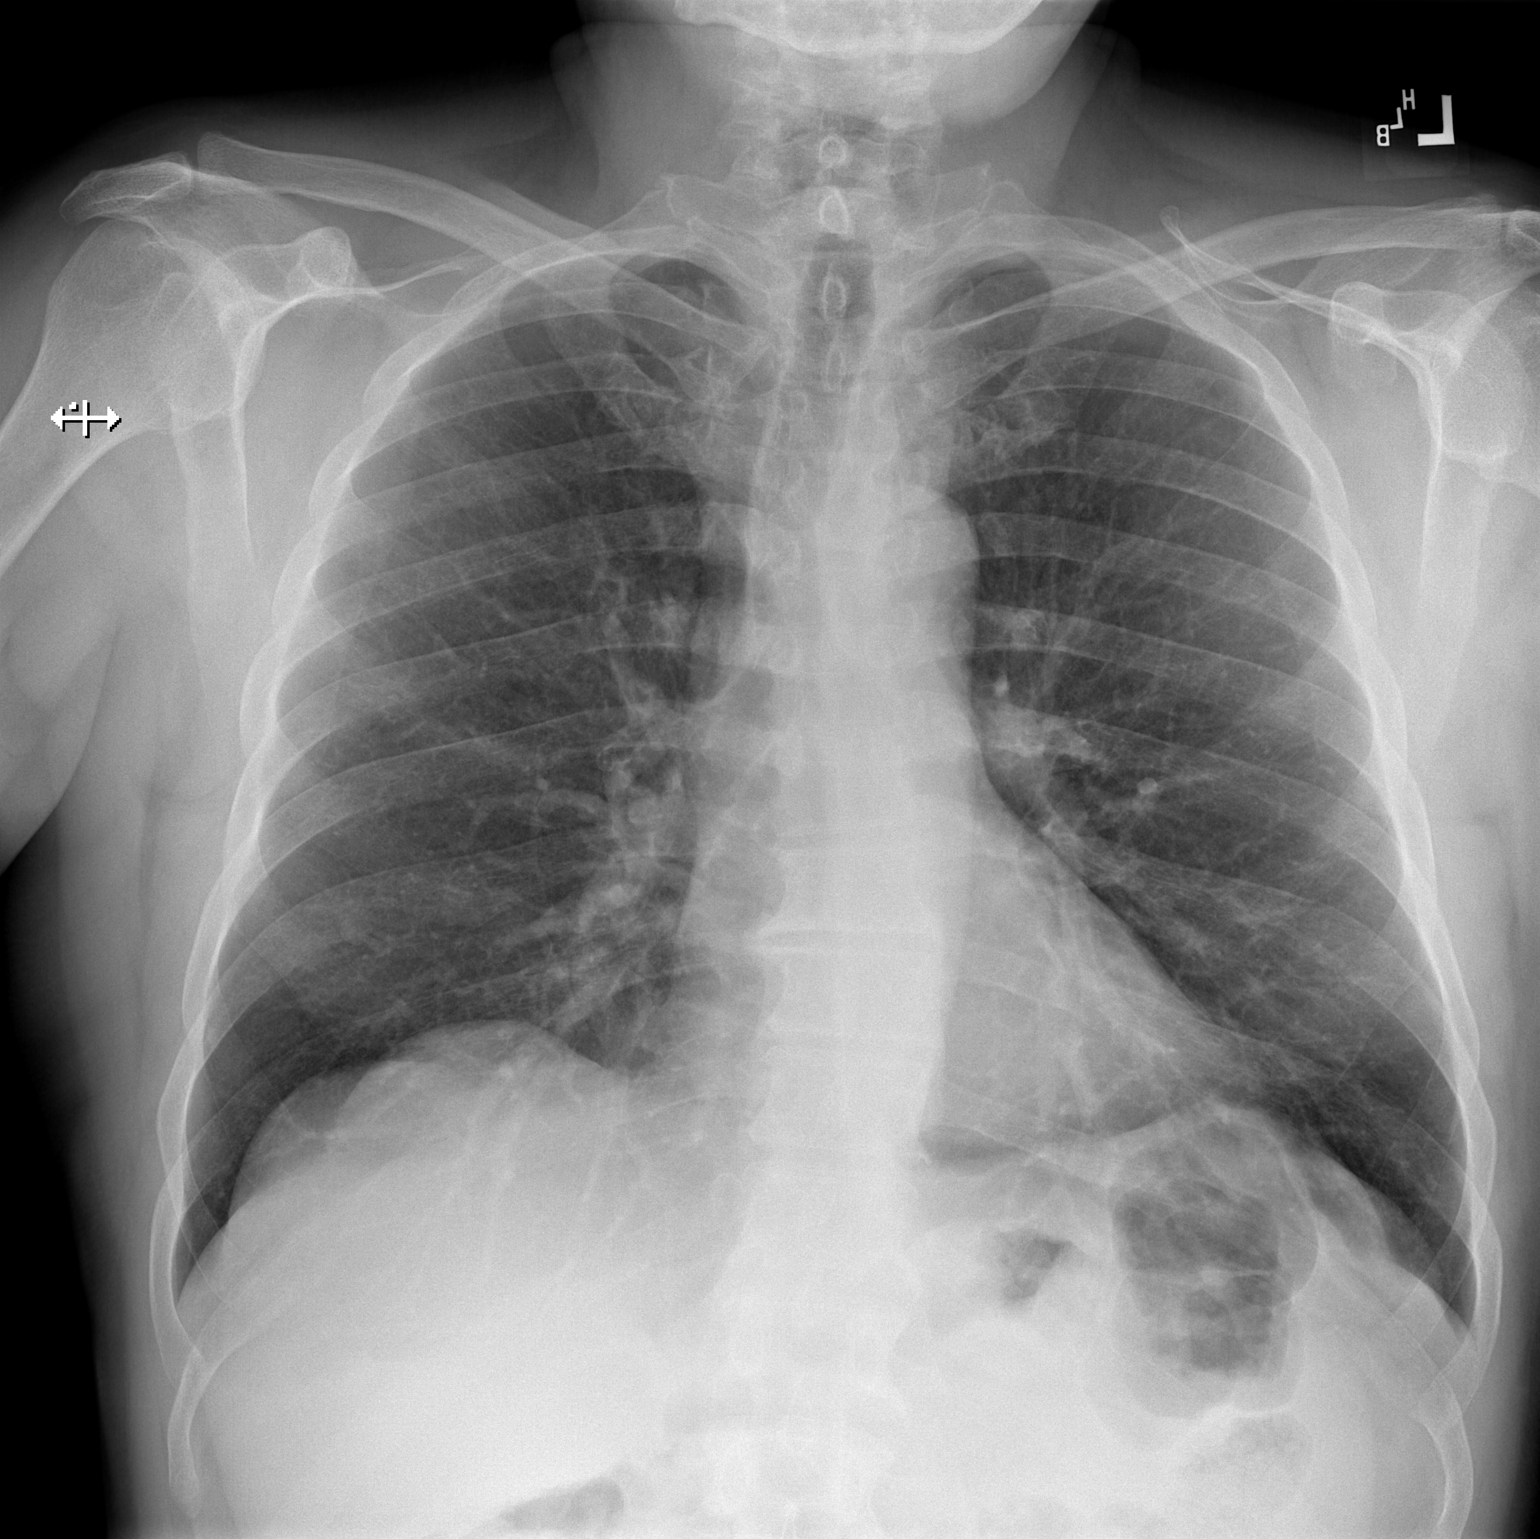

[w chest lat]
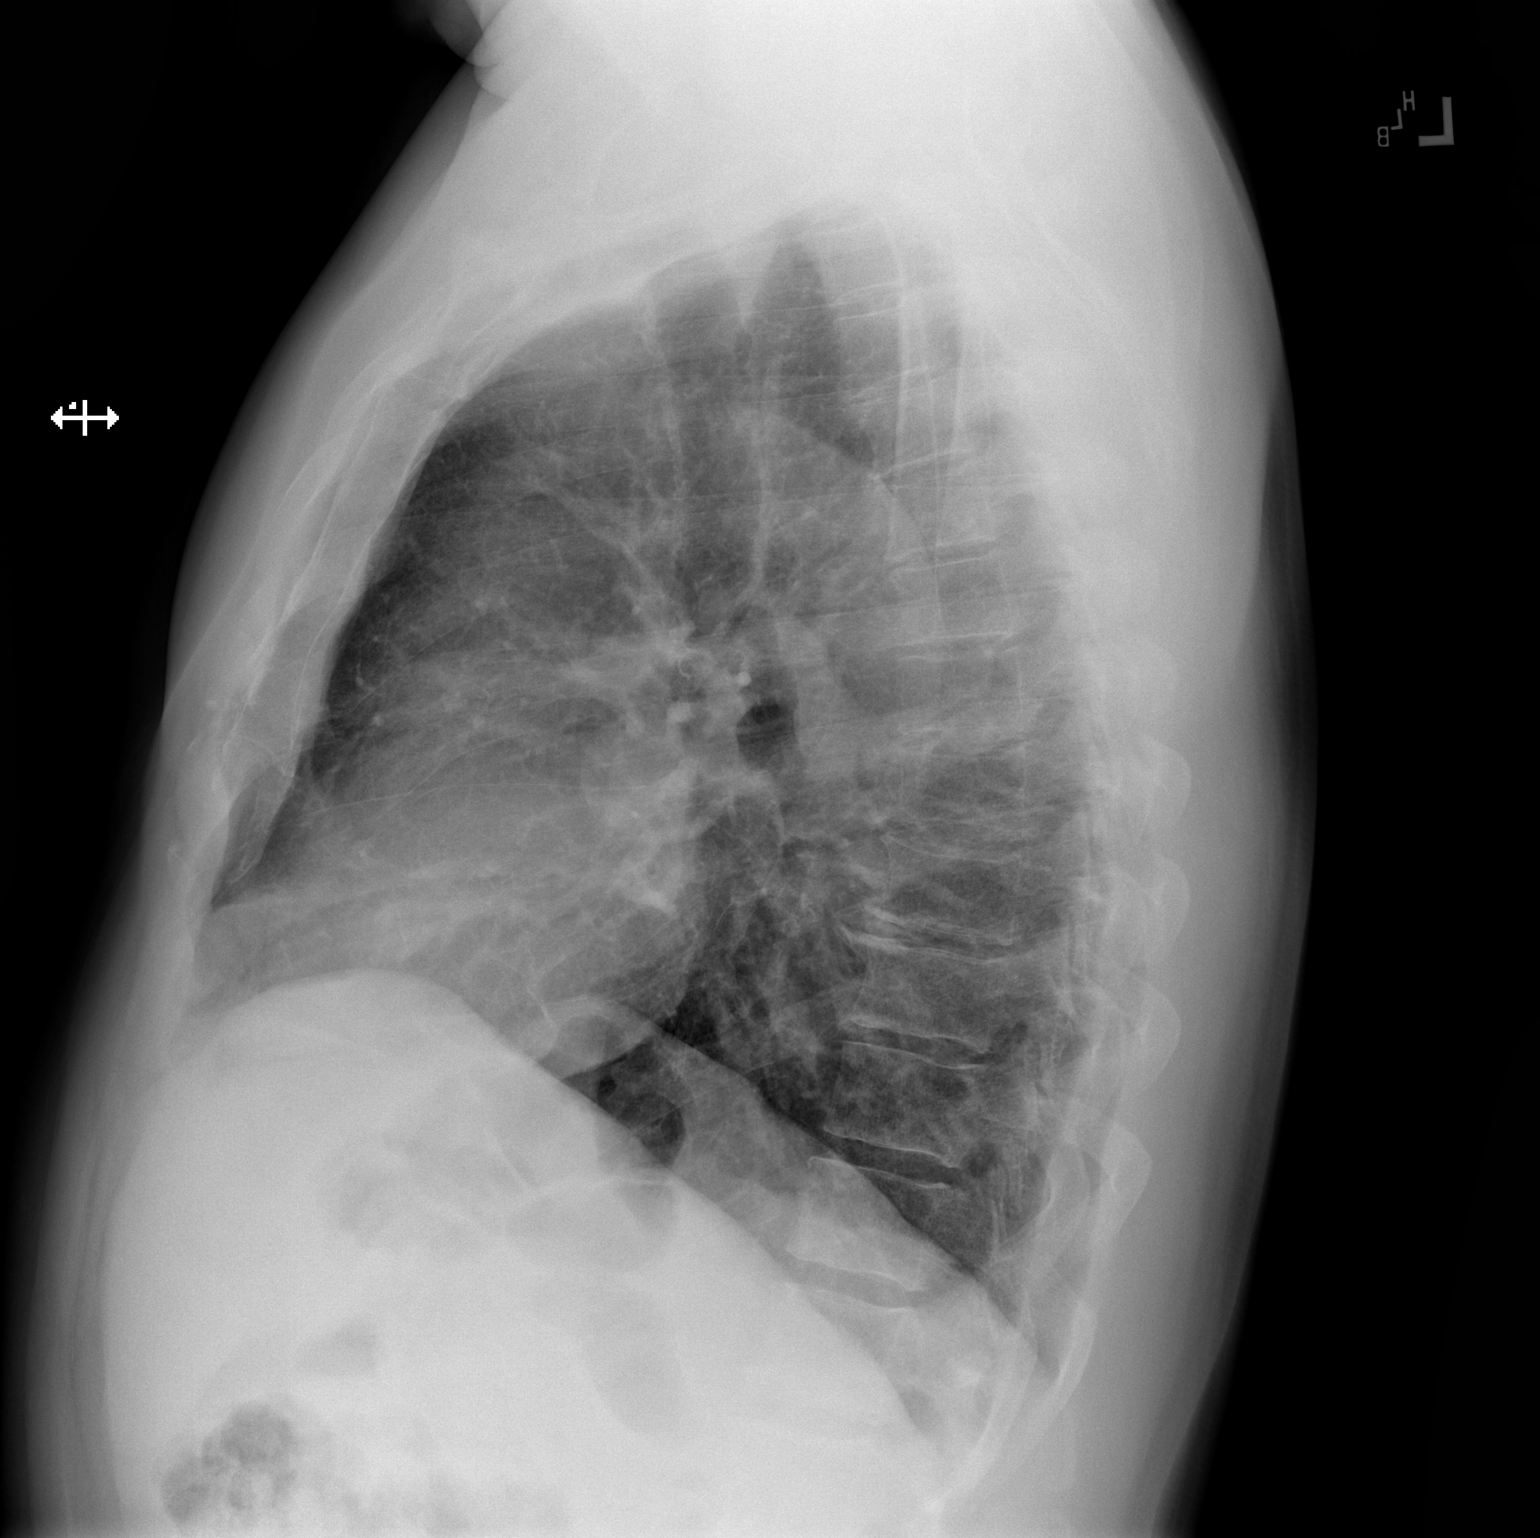

[2 of 2 positions shown; findings below may reference images not displayed]

FINDINGS: Cardiomediastinal silhouette within normal limits in size and
contour. No evidence of pneumothorax, pleural effusion, or confluent
airspace disease. Mildly coarsened interstitial markings similar to
the prior.

No displaced fracture.  Degenerative changes of the spine.
IMPRESSION: Negative for acute cardiopulmonary disease

## 2021-04-14 DIAGNOSIS — M79672 Pain in left foot: Secondary | ICD-10-CM | POA: Diagnosis not present

## 2021-04-14 DIAGNOSIS — Z87821 Personal history of retained foreign body fully removed: Secondary | ICD-10-CM | POA: Diagnosis not present

## 2021-05-03 DIAGNOSIS — M722 Plantar fascial fibromatosis: Secondary | ICD-10-CM | POA: Diagnosis not present

## 2021-05-04 DIAGNOSIS — M5416 Radiculopathy, lumbar region: Secondary | ICD-10-CM | POA: Diagnosis not present

## 2021-05-06 ENCOUNTER — Other Ambulatory Visit: Payer: Self-pay

## 2021-05-06 ENCOUNTER — Encounter (HOSPITAL_COMMUNITY): Payer: Self-pay

## 2021-05-06 ENCOUNTER — Emergency Department (HOSPITAL_COMMUNITY)
Admission: EM | Admit: 2021-05-06 | Discharge: 2021-05-06 | Disposition: A | Discharge status: Home / Self Care | Payer: 59 | Attending: Emergency Medicine | Admitting: Emergency Medicine

## 2021-05-06 DIAGNOSIS — Z794 Long term (current) use of insulin: Secondary | ICD-10-CM | POA: Insufficient documentation

## 2021-05-06 DIAGNOSIS — M79672 Pain in left foot: Secondary | ICD-10-CM | POA: Insufficient documentation

## 2021-05-06 DIAGNOSIS — Z7982 Long term (current) use of aspirin: Secondary | ICD-10-CM | POA: Insufficient documentation

## 2021-05-06 DIAGNOSIS — R200 Anesthesia of skin: Secondary | ICD-10-CM | POA: Diagnosis not present

## 2021-05-06 DIAGNOSIS — E119 Type 2 diabetes mellitus without complications: Secondary | ICD-10-CM | POA: Insufficient documentation

## 2021-05-06 DIAGNOSIS — M722 Plantar fascial fibromatosis: Secondary | ICD-10-CM | POA: Diagnosis not present

## 2021-05-06 LAB — BASIC METABOLIC PANEL
Anion gap: 8 (ref 5–15)
BUN: 22 mg/dL (ref 8–23)
CO2: 26 mmol/L (ref 22–32)
Calcium: 9.6 mg/dL (ref 8.9–10.3)
Chloride: 101 mmol/L (ref 98–111)
Creatinine, Ser: 1.07 mg/dL (ref 0.61–1.24)
GFR, Estimated: >60 mL/min (ref >60)
Glucose, Bld: 189 mg/dL — ABNORMAL HIGH (ref 70–99)
Potassium: 4.1 mmol/L (ref 3.5–5.1)
Sodium: 135 mmol/L (ref 135–145)

## 2021-05-06 LAB — CBC WITH DIFFERENTIAL/PLATELET
Abs Immature Granulocytes: 0.03 10*3/uL (ref 0.00–0.07)
Basophils Absolute: 0.1 10*3/uL (ref 0.0–0.1)
Basophils Relative: 1 %
Eosinophils Absolute: 0.1 10*3/uL (ref 0.0–0.5)
Eosinophils Relative: 1 %
HCT: 46.1 % (ref 39.0–52.0)
Hemoglobin: 15.9 g/dL (ref 13.0–17.0)
Immature Granulocytes: 0 %
Lymphocytes Relative: 24 %
Lymphs Abs: 2.2 10*3/uL (ref 0.7–4.0)
MCH: 29.4 pg (ref 26.0–34.0)
MCHC: 34.5 g/dL (ref 30.0–36.0)
MCV: 85.4 fL (ref 80.0–100.0)
Monocytes Absolute: 0.7 10*3/uL (ref 0.1–1.0)
Monocytes Relative: 7 %
Neutro Abs: 6.3 10*3/uL (ref 1.7–7.7)
Neutrophils Relative %: 67 %
Platelets: 232 10*3/uL (ref 150–400)
RBC: 5.4 MIL/uL (ref 4.22–5.81)
RDW: 13.6 % (ref 11.5–15.5)
WBC: 9.3 10*3/uL (ref 4.0–10.5)
nRBC: 0 % (ref 0.0–0.2)

## 2021-05-06 MED ORDER — IBUPROFEN 800 MG PO TABS
800.0000 mg | ORAL_TABLET | Freq: Once | ORAL | Status: AC
  Administered 2021-05-06: 800 mg via ORAL
  Filled 2021-05-06: qty 1

## 2021-05-06 NOTE — Discharge Instructions (Signed)
Please return to ED with any new or worsening signs or symptoms such as decreased pulse to foot, inability to lift foot, extreme pain Please follow-up with your orthopedic doctor for further evaluation of your left toe pain Please take 600 to 800 mg of ibuprofen every 6 hours as needed for swelling Please continue icing the foot.  You may also utilize heat pads. Please do not wear the boot any longer as instructed by the orthopedic PA

## 2021-05-06 NOTE — ED Provider Notes (Signed)
[place] DEPT Provider Note   CSN: [id number] Arrival date & time: 05/06/21  8850     History  No chief complaint on file.   Miguel [name] is a 61 y.o. male with history of arthritis, diabetes, sleep apnea, lumbar radiculopathy.  Patient states that since September he has been having left foot discomfort.  Patient states that he has been seen for this complaint numerous times at [facility] most recently on 05/03/2021, given steroid shots as well as meloxicam prescription.  Patient states he is not taking meloxicam prescription due to his fear that it is too hard on his heart. Patient was diagnosed with plantar fasciitis and is currently undergoing work-up and evaluation through [facility] with plans for MRI of left foot to assess fascia.  Patient was recently given walking boot last week.  Patient states that Tuesday morning he woke up with numbness of left toes.  Patient states that the numbness is occurring in all the toes on the left foot and he believes it is due to the boot he was given.  Patient states that he contacted his orthopedist who advised him to stop wearing the boot.  Patient states he is currently taking gabapentin 3 times daily and does not feel this is related to his diabetes or peripheral neuropathy.  Patient has attempted to alleviate pain utilizing cortisone injections, frozen water bottles, insoles without any symptomatic relief.  The patient is quite frustrated because he is unable to determine what is causing his left toe numbness and left heel pain.  Patient endorses numbness and tingling to the left toes.  HPI     Home Medications Prior to Admission medications   Medication Sig Start Date End Date Taking? Authorizing Provider  aspirin EC 81 MG tablet Take 81 mg by mouth daily.    [provider]  gabapentin (NEURONTIN) 600 MG tablet Take 600 mg by mouth 3 (three) times daily.    [provider]  Insulin Glargine  (LANTUS SOLOSTAR) 100 UNIT/ML Solostar Pen Inject 40 Units into the skin 2 (two) times daily.    [provider]  Oxycodone HCl 10 MG TABS Take 10 mg by mouth 6 (six) times daily.    [provider]  rosuvastatin (CRESTOR) 5 MG tablet Take 5 mg by mouth 3 (three) times a week.    [provider]  sertraline (ZOLOFT) 100 MG tablet Take 100 mg by mouth at bedtime.    [provider]  sildenafil (REVATIO) 20 MG tablet Take 100 mg by mouth daily as needed (for erectile dysfunction.).     [provider]  tiZANidine (ZANAFLEX) 4 MG tablet Take 4 mg by mouth every 8 (eight) hours as needed for muscle spasms.    [provider]  traZODone (DESYREL) 50 MG tablet Take 50 mg by mouth at bedtime.    [provider]  varenicline (CHANTIX) 1 MG tablet Take 1 mg by mouth 2 (two) times daily.    [provider]      Allergies    Patient has no known allergies.    Review of Systems   Review of Systems  Neurological:  Positive for numbness (Numbness to all toes of left foot). Negative for weakness.  All other systems reviewed and are negative.  Physical Exam Updated Vital Signs BP 132/77    Pulse (!) 56    Temp 97.9 F (36.6 C) (Oral)    Resp 17    Ht 6' (1.829 m)  Wt 83.9 kg    SpO2 97%    BMI 25.09 kg/m  Physical Exam Vitals and nursing note reviewed.  Constitutional:      General: He is not in acute distress.    Appearance: He is not ill-appearing or toxic-appearing.  HENT:     Head: Normocephalic and atraumatic.     Mouth/Throat:     Mouth: Mucous membranes are moist.  Eyes:     Extraocular Movements: Extraocular movements intact.     Pupils: Pupils are equal, round, and reactive to light.  Cardiovascular:     Rate and Rhythm: Normal rate and regular rhythm.     Pulses:          Dorsalis pedis pulses are 1+ on the left side.  Pulmonary:     Effort: Pulmonary effort is normal.     Breath sounds: Normal breath sounds.  No stridor. No wheezing, rhonchi or rales.  Abdominal:     General: Abdomen is flat. There is no distension.     Palpations: Abdomen is soft.     Tenderness: There is no abdominal tenderness. There is no guarding.  Musculoskeletal:     Cervical back: Normal range of motion. No tenderness.     Left foot: No deformity.  Feet:     Left foot:     Protective Sensation: 6 sites tested.  4 sites sensed.     Skin integrity: Skin integrity normal. No ulcer, blister, skin breakdown or erythema.     Toenail Condition: Left toenails are normal.  Skin:    General: Skin is warm and dry.     Capillary Refill: Capillary refill takes less than 2 seconds.  Neurological:     Mental Status: He is alert and oriented to person, place, and time.    ED Results / Procedures / Treatments   Labs (all labs ordered are listed, but only abnormal results are displayed) Labs Reviewed  BASIC METABOLIC PANEL - Abnormal; Notable for the following components:      Result Value   Glucose, Bld 189 (*)    All other components within normal limits  CBC WITH DIFFERENTIAL/PLATELET    EKG None  Radiology No results found.  Procedures Procedures    Medications Ordered in ED Medications  ibuprofen (ADVIL) tablet 800 mg (800 mg Oral Given 05/06/21 1036)    ED Course/ Medical Decision Making/ A&P                           Medical Decision Making Amount and/or Complexity of Data Reviewed Labs: ordered.  Risk Prescription drug management.   61 year old male presents due to left toe numbness since Tuesday.  Patient being seen at [facility] with current plans for MRI to evaluate fascia left foot.  Patient states symptoms began after wearing walking boot that was prescribed by [facility].  Patient worked up utilizing: CBC, BMP. BMP shows elevated glucose of 189, however this should not be causing left toe numbness. Patient CBC is reassuring and within normal limits.  On examination of patient's left  foot, patient has 1+ dorsalis pedis pulse.  Patient has capillary refill lasting less than 2 seconds.  Patient has normal range of motion to the ankle.  There are no signs of cellulitis.    The patient reports that he is currently being treated with gabapentin 3 times daily, for this reason I have low suspicion of peripheral neuropathy being the cause of this patient's toe  numbness.  I believe that this patient's plantar fasciitis is compressing a nerve in his foot causing numbness of his toes.  I believe that when the patient wore a walking boot this further compressed the nerves of his foot further exacerbating his symptoms.  At this time, there seems to be no emergent cause of the patient's toe numbness.  The patient's foot is being perfused, it is warm, and has good capillary refill.  I treated this patient's pain with 800 mg of ibuprofen, and the patient stated that he felt slightly better after administration of this.  This patient seems able for discharge at this time.  I discussed the patient his case with Dr. Tomi Bamberger who is in agreement with the plan to discharge with outpatient neurology follow-up.  I have advised the patient to return to his orthopedist for further evaluation and possible MRI of the foot which is supposedly what they plan on doing further note that I reviewed.  I have advised the patient to continue icing the foot as well as taking 100 mg ibuprofen every 6 hours.  The patient voices agreement with this plan.  The patient was given return precautions and he voices understanding.  The patient had all his questions answered to his satisfaction.  Patient stable for discharge   Final Clinical Impression(s) / ED Diagnoses Final diagnoses:  Numbness of toes    Rx / DC Orders ED Discharge Orders     None         Lawana Chambers 05/06/21 1119    Dorie Rank, MD 05/06/21 2010

## 2021-05-06 NOTE — ED Triage Notes (Signed)
Pt states that he has tingling in his left foot. Patient states that the tingling began Tuesday morning. Patient has plantar fascia. Patient is currently wearing a boot on the left foot. Patient states that he received a nerve root block in his back on yesterday. Patient states that the boot makes the tingling worse.

## 2021-05-12 DIAGNOSIS — M79672 Pain in left foot: Secondary | ICD-10-CM | POA: Diagnosis not present

## 2021-05-12 DIAGNOSIS — M722 Plantar fascial fibromatosis: Secondary | ICD-10-CM | POA: Diagnosis not present

## 2021-05-12 DIAGNOSIS — M79605 Pain in left leg: Secondary | ICD-10-CM | POA: Diagnosis not present

## 2021-05-12 DIAGNOSIS — M5459 Other low back pain: Secondary | ICD-10-CM | POA: Diagnosis not present

## 2021-05-12 DIAGNOSIS — M25572 Pain in left ankle and joints of left foot: Secondary | ICD-10-CM | POA: Diagnosis not present

## 2021-05-22 DIAGNOSIS — M25572 Pain in left ankle and joints of left foot: Secondary | ICD-10-CM | POA: Diagnosis not present

## 2021-05-22 DIAGNOSIS — M5459 Other low back pain: Secondary | ICD-10-CM | POA: Diagnosis not present

## 2021-05-31 DIAGNOSIS — M722 Plantar fascial fibromatosis: Secondary | ICD-10-CM | POA: Diagnosis not present

## 2021-05-31 DIAGNOSIS — M6702 Short Achilles tendon (acquired), left ankle: Secondary | ICD-10-CM | POA: Diagnosis not present

## 2021-06-01 ENCOUNTER — Other Ambulatory Visit (HOSPITAL_COMMUNITY): Payer: Self-pay | Admitting: Orthopedic Surgery

## 2021-06-09 ENCOUNTER — Other Ambulatory Visit: Payer: Self-pay

## 2021-06-09 ENCOUNTER — Encounter (HOSPITAL_BASED_OUTPATIENT_CLINIC_OR_DEPARTMENT_OTHER): Payer: Self-pay | Admitting: Orthopedic Surgery

## 2021-06-15 ENCOUNTER — Encounter (HOSPITAL_BASED_OUTPATIENT_CLINIC_OR_DEPARTMENT_OTHER)
Admission: RE | Admit: 2021-06-15 | Discharge: 2021-06-15 | Disposition: A | Discharge status: Home / Self Care | Payer: 59 | Source: Ambulatory Visit | Attending: Orthopedic Surgery | Admitting: Orthopedic Surgery

## 2021-06-15 DIAGNOSIS — M722 Plantar fascial fibromatosis: Secondary | ICD-10-CM | POA: Diagnosis present

## 2021-06-15 DIAGNOSIS — I252 Old myocardial infarction: Secondary | ICD-10-CM | POA: Diagnosis not present

## 2021-06-15 DIAGNOSIS — G4730 Sleep apnea, unspecified: Secondary | ICD-10-CM | POA: Diagnosis not present

## 2021-06-15 DIAGNOSIS — E119 Type 2 diabetes mellitus without complications: Secondary | ICD-10-CM | POA: Diagnosis not present

## 2021-06-15 DIAGNOSIS — F17210 Nicotine dependence, cigarettes, uncomplicated: Secondary | ICD-10-CM | POA: Diagnosis not present

## 2021-06-15 DIAGNOSIS — Z794 Long term (current) use of insulin: Secondary | ICD-10-CM | POA: Diagnosis not present

## 2021-06-15 DIAGNOSIS — I2510 Atherosclerotic heart disease of native coronary artery without angina pectoris: Secondary | ICD-10-CM | POA: Diagnosis not present

## 2021-06-15 DIAGNOSIS — Z981 Arthrodesis status: Secondary | ICD-10-CM | POA: Diagnosis not present

## 2021-06-15 DIAGNOSIS — Z01818 Encounter for other preprocedural examination: Secondary | ICD-10-CM | POA: Insufficient documentation

## 2021-06-15 DIAGNOSIS — J449 Chronic obstructive pulmonary disease, unspecified: Secondary | ICD-10-CM | POA: Diagnosis not present

## 2021-06-15 DIAGNOSIS — M6702 Short Achilles tendon (acquired), left ankle: Secondary | ICD-10-CM | POA: Diagnosis not present

## 2021-06-15 LAB — BASIC METABOLIC PANEL
Anion gap: 6 (ref 5–15)
BUN: 8 mg/dL (ref 8–23)
CO2: 25 mmol/L (ref 22–32)
Calcium: 8.9 mg/dL (ref 8.9–10.3)
Chloride: 102 mmol/L (ref 98–111)
Creatinine, Ser: 1.14 mg/dL (ref 0.61–1.24)
GFR, Estimated: >60 mL/min (ref >60)
Glucose, Bld: 305 mg/dL — ABNORMAL HIGH (ref 70–99)
Potassium: 5.1 mmol/L (ref 3.5–5.1)
Sodium: 133 mmol/L — ABNORMAL LOW (ref 135–145)

## 2021-06-15 NOTE — Progress Notes (Signed)

## 2021-06-16 NOTE — Progress Notes (Signed)
Dr. Roanna Banning and the pt notified of glucose result of 305.  The pt states he ate a large lunch yesterday before getting his lab work.  Pt stated that he would take his Lantus today and be NPO after midnight, until his ERAS/Gatorade 2 drink at [facility] 06/17/2021.  Educated the pt on the importance of glucose needing to be under 300 tomorrow to continue with scheduled surgery. Pt verbalized understandings. ?

## 2021-06-17 ENCOUNTER — Encounter (HOSPITAL_BASED_OUTPATIENT_CLINIC_OR_DEPARTMENT_OTHER)
Admission: RE | Disposition: A | Discharge status: Home / Self Care | Payer: Self-pay | Source: Home / Self Care | Attending: Orthopedic Surgery

## 2021-06-17 ENCOUNTER — Ambulatory Visit (HOSPITAL_BASED_OUTPATIENT_CLINIC_OR_DEPARTMENT_OTHER): Payer: 59 | Admitting: Anesthesiology

## 2021-06-17 ENCOUNTER — Other Ambulatory Visit: Payer: Self-pay

## 2021-06-17 ENCOUNTER — Encounter (HOSPITAL_BASED_OUTPATIENT_CLINIC_OR_DEPARTMENT_OTHER): Payer: Self-pay | Admitting: Orthopedic Surgery

## 2021-06-17 ENCOUNTER — Ambulatory Visit (HOSPITAL_BASED_OUTPATIENT_CLINIC_OR_DEPARTMENT_OTHER)
Admission: RE | Admit: 2021-06-17 | Discharge: 2021-06-17 | Disposition: A | Discharge status: Home / Self Care | Payer: 59 | Attending: Orthopedic Surgery | Admitting: Orthopedic Surgery

## 2021-06-17 DIAGNOSIS — I2510 Atherosclerotic heart disease of native coronary artery without angina pectoris: Secondary | ICD-10-CM | POA: Diagnosis not present

## 2021-06-17 DIAGNOSIS — M6702 Short Achilles tendon (acquired), left ankle: Secondary | ICD-10-CM | POA: Diagnosis not present

## 2021-06-17 DIAGNOSIS — E119 Type 2 diabetes mellitus without complications: Secondary | ICD-10-CM | POA: Insufficient documentation

## 2021-06-17 DIAGNOSIS — J449 Chronic obstructive pulmonary disease, unspecified: Secondary | ICD-10-CM | POA: Insufficient documentation

## 2021-06-17 DIAGNOSIS — M722 Plantar fascial fibromatosis: Secondary | ICD-10-CM | POA: Insufficient documentation

## 2021-06-17 DIAGNOSIS — Z794 Long term (current) use of insulin: Secondary | ICD-10-CM | POA: Diagnosis not present

## 2021-06-17 DIAGNOSIS — F17210 Nicotine dependence, cigarettes, uncomplicated: Secondary | ICD-10-CM | POA: Insufficient documentation

## 2021-06-17 DIAGNOSIS — G4730 Sleep apnea, unspecified: Secondary | ICD-10-CM | POA: Insufficient documentation

## 2021-06-17 DIAGNOSIS — I252 Old myocardial infarction: Secondary | ICD-10-CM | POA: Insufficient documentation

## 2021-06-17 DIAGNOSIS — Z981 Arthrodesis status: Secondary | ICD-10-CM

## 2021-06-17 HISTORY — PX: PLANTAR FASCIA RELEASE: SHX2239

## 2021-06-17 HISTORY — PX: GASTROCNEMIUS RECESSION: SHX863

## 2021-06-17 LAB — GLUCOSE, CAPILLARY
Glucose-Capillary: 91 mg/dL (ref 70–99)
Glucose-Capillary: 115 mg/dL — ABNORMAL HIGH (ref 70–99)

## 2021-06-17 SURGERY — RECESSION, MUSCLE, GASTROCNEMIUS
Anesthesia: Regional | Site: Leg Lower | Laterality: Left

## 2021-06-17 MED ORDER — SODIUM CHLORIDE 0.9 % IV SOLN: INTRAVENOUS | Status: DC

## 2021-06-17 MED ORDER — FENTANYL CITRATE (PF) 100 MCG/2ML IJ SOLN: 25.0000 ug | INTRAMUSCULAR | Status: DC | PRN

## 2021-06-17 MED ORDER — ROPIVACAINE HCL 5 MG/ML IJ SOLN
INTRAMUSCULAR | Status: DC | PRN
  Administered 2021-06-17: 30 mL via PERINEURAL

## 2021-06-17 MED ORDER — ACETAMINOPHEN 500 MG PO TABS
1000.0000 mg | ORAL_TABLET | Freq: Once | ORAL | Status: AC
  Administered 2021-06-17: 10:00:00 1000 mg via ORAL

## 2021-06-17 MED ORDER — MIDAZOLAM HCL 2 MG/2ML IJ SOLN
INTRAMUSCULAR | Status: AC
  Filled 2021-06-17: qty 2

## 2021-06-17 MED ORDER — OXYCODONE HCL 5 MG PO TABS: 5.0000 mg | ORAL_TABLET | Freq: Four times a day (QID) | ORAL | 0 refills | Status: AC | PRN

## 2021-06-17 MED ORDER — ONDANSETRON HCL 4 MG/2ML IJ SOLN
INTRAMUSCULAR | Status: DC | PRN
  Administered 2021-06-17: 4 mg via INTRAVENOUS

## 2021-06-17 MED ORDER — LIDOCAINE HCL (CARDIAC) PF 100 MG/5ML IV SOSY
PREFILLED_SYRINGE | INTRAVENOUS | Status: DC | PRN
  Administered 2021-06-17: 80 mg via INTRAVENOUS

## 2021-06-17 MED ORDER — CEFAZOLIN SODIUM-DEXTROSE 2-4 GM/100ML-% IV SOLN
2.0000 g | INTRAVENOUS | Status: AC
  Administered 2021-06-17: 12:00:00 2 g via INTRAVENOUS

## 2021-06-17 MED ORDER — SENNA 8.6 MG PO TABS: 2.0000 | ORAL_TABLET | Freq: Two times a day (BID) | ORAL | 0 refills | Status: DC

## 2021-06-17 MED ORDER — PROPOFOL 10 MG/ML IV BOLUS
INTRAVENOUS | Status: DC | PRN
  Administered 2021-06-17: 150 mg via INTRAVENOUS

## 2021-06-17 MED ORDER — ONDANSETRON HCL 4 MG/2ML IJ SOLN
INTRAMUSCULAR | Status: AC
  Filled 2021-06-17: qty 2

## 2021-06-17 MED ORDER — ONDANSETRON HCL 4 MG/2ML IJ SOLN: 4.0000 mg | Freq: Once | INTRAMUSCULAR | Status: DC | PRN

## 2021-06-17 MED ORDER — LACTATED RINGERS IV SOLN: INTRAVENOUS | Status: DC

## 2021-06-17 MED ORDER — EPHEDRINE SULFATE (PRESSORS) 50 MG/ML IJ SOLN
INTRAMUSCULAR | Status: DC | PRN
  Administered 2021-06-17 (×2): 10 mg via INTRAVENOUS

## 2021-06-17 MED ORDER — VANCOMYCIN HCL 500 MG IV SOLR
INTRAVENOUS | Status: DC | PRN
  Administered 2021-06-17: 500 mg via TOPICAL

## 2021-06-17 MED ORDER — OXYCODONE HCL 5 MG PO TABS: 5.0000 mg | ORAL_TABLET | Freq: Once | ORAL | Status: DC | PRN

## 2021-06-17 MED ORDER — CEFAZOLIN SODIUM-DEXTROSE 2-4 GM/100ML-% IV SOLN
INTRAVENOUS | Status: AC
  Filled 2021-06-17: qty 100

## 2021-06-17 MED ORDER — AMISULPRIDE (ANTIEMETIC) 5 MG/2ML IV SOLN: 10.0000 mg | Freq: Once | INTRAVENOUS | Status: DC | PRN

## 2021-06-17 MED ORDER — DOCUSATE SODIUM 100 MG PO CAPS: 100.0000 mg | ORAL_CAPSULE | Freq: Two times a day (BID) | ORAL | 0 refills | Status: DC

## 2021-06-17 MED ORDER — MIDAZOLAM HCL 2 MG/2ML IJ SOLN
2.0000 mg | Freq: Once | INTRAMUSCULAR | Status: AC
Start: 2021-06-17 — End: 2021-06-17
  Administered 2021-06-17: 10:00:00 2 mg via INTRAVENOUS

## 2021-06-17 MED ORDER — FENTANYL CITRATE (PF) 100 MCG/2ML IJ SOLN
INTRAMUSCULAR | Status: AC
  Filled 2021-06-17: qty 2

## 2021-06-17 MED ORDER — ASPIRIN EC 81 MG PO TBEC
81.0000 mg | DELAYED_RELEASE_TABLET | Freq: Two times a day (BID) | ORAL | 0 refills | Status: DC
Start: 2021-06-17 — End: 2021-09-02

## 2021-06-17 MED ORDER — 0.9 % SODIUM CHLORIDE (POUR BTL) OPTIME
TOPICAL | Status: DC | PRN
  Administered 2021-06-17: 12:00:00 250 mL

## 2021-06-17 MED ORDER — FENTANYL CITRATE (PF) 100 MCG/2ML IJ SOLN
100.0000 ug | Freq: Once | INTRAMUSCULAR | Status: AC
  Administered 2021-06-17: 10:00:00 50 ug via INTRAVENOUS

## 2021-06-17 MED ORDER — PROPOFOL 10 MG/ML IV BOLUS
INTRAVENOUS | Status: AC
  Filled 2021-06-17: qty 20

## 2021-06-17 MED ORDER — OXYCODONE HCL 5 MG/5ML PO SOLN: 5.0000 mg | Freq: Once | ORAL | Status: DC | PRN

## 2021-06-17 MED ORDER — ACETAMINOPHEN 500 MG PO TABS
ORAL_TABLET | ORAL | Status: AC
  Filled 2021-06-17: qty 2

## 2021-06-17 SURGICAL SUPPLY — 63 items
APL PRP STRL LF DISP 70% ISPRP (MISCELLANEOUS) ×2
BANDAGE ESMARK 6X9 LF (GAUZE/BANDAGES/DRESSINGS) IMPLANT
BLADE ARTHRO LOK 4 BEAVER (BLADE) ×1 IMPLANT
BLADE SURG 15 STRL LF DISP TIS (BLADE) ×2 IMPLANT
BLADE SURG 15 STRL SS (BLADE) ×6
BNDG CMPR 9X6 STRL LF SNTH (GAUZE/BANDAGES/DRESSINGS)
BNDG COHESIVE 4X5 TAN ST LF (GAUZE/BANDAGES/DRESSINGS) IMPLANT
BNDG COHESIVE 6X5 TAN ST LF (GAUZE/BANDAGES/DRESSINGS) IMPLANT
BNDG ELASTIC 4X5.8 VLCR STR LF (GAUZE/BANDAGES/DRESSINGS) ×1 IMPLANT
BNDG ELASTIC 6X5.8 VLCR STR LF (GAUZE/BANDAGES/DRESSINGS) ×1 IMPLANT
BNDG ESMARK 6X9 LF (GAUZE/BANDAGES/DRESSINGS)
BOOT STEPPER DURA LG (SOFTGOODS) IMPLANT
BOOT STEPPER DURA MED (SOFTGOODS) IMPLANT
BOOT STEPPER DURA XLG (SOFTGOODS) IMPLANT
CHLORAPREP W/TINT 26 (MISCELLANEOUS) ×3 IMPLANT
COVER BACK TABLE 60X90IN (DRAPES) ×3 IMPLANT
CUFF TOURN SGL QUICK 34 (TOURNIQUET CUFF)
CUFF TRNQT CYL 34X4.125X (TOURNIQUET CUFF) IMPLANT
DRAPE EXTREMITY T 121X128X90 (DISPOSABLE) ×3 IMPLANT
DRAPE U-SHAPE 47X51 STRL (DRAPES) ×3 IMPLANT
DRSG MEPITEL 4X7.2 (GAUZE/BANDAGES/DRESSINGS) ×3 IMPLANT
DRSG PAD ABDOMINAL 8X10 ST (GAUZE/BANDAGES/DRESSINGS) ×3 IMPLANT
ELECT REM PT RETURN 9FT ADLT (ELECTROSURGICAL) ×3
ELECTRODE REM PT RTRN 9FT ADLT (ELECTROSURGICAL) ×2 IMPLANT
GAUZE SPONGE 4X4 12PLY STRL (GAUZE/BANDAGES/DRESSINGS) ×3 IMPLANT
GLOVE SRG 8 PF TXTR STRL LF DI (GLOVE) ×4 IMPLANT
GLOVE SURG ENC MOIS LTX SZ8 (GLOVE) ×3 IMPLANT
GLOVE SURG LTX SZ8 (GLOVE) ×3 IMPLANT
GLOVE SURG ORTHO LTX SZ8 (GLOVE) ×1 IMPLANT
GLOVE SURG POLYISO LF SZ7 (GLOVE) ×1 IMPLANT
GLOVE SURG UNDER POLY LF SZ7 (GLOVE) ×2 IMPLANT
GLOVE SURG UNDER POLY LF SZ8 (GLOVE) ×6
GOWN STRL REUS W/ TWL LRG LVL3 (GOWN DISPOSABLE) ×2 IMPLANT
GOWN STRL REUS W/ TWL XL LVL3 (GOWN DISPOSABLE) ×4 IMPLANT
GOWN STRL REUS W/TWL LRG LVL3 (GOWN DISPOSABLE) ×3
GOWN STRL REUS W/TWL XL LVL3 (GOWN DISPOSABLE) ×6
NDL HYPO 25X1 1.5 SAFETY (NEEDLE) IMPLANT
NEEDLE HYPO 22GX1.5 SAFETY (NEEDLE) IMPLANT
NEEDLE HYPO 25X1 1.5 SAFETY (NEEDLE) IMPLANT
NS IRRIG 1000ML POUR BTL (IV SOLUTION) ×3 IMPLANT
PACK BASIN DAY SURGERY FS (CUSTOM PROCEDURE TRAY) ×3 IMPLANT
PAD CAST 4YDX4 CTTN HI CHSV (CAST SUPPLIES) ×2 IMPLANT
PADDING CAST COTTON 4X4 STRL (CAST SUPPLIES) ×3
PADDING CAST COTTON 6X4 STRL (CAST SUPPLIES) ×1 IMPLANT
PENCIL SMOKE EVACUATOR (MISCELLANEOUS) ×3 IMPLANT
SANITIZER HAND PURELL 535ML FO (MISCELLANEOUS) ×3 IMPLANT
SHEET MEDIUM DRAPE 40X70 STRL (DRAPES) ×3 IMPLANT
SPLINT FAST PLASTER 5X30 (CAST SUPPLIES) ×20
SPLINT PLASTER CAST FAST 5X30 (CAST SUPPLIES) IMPLANT
SPONGE T-LAP 18X18 ~~LOC~~+RFID (SPONGE) ×3 IMPLANT
STOCKINETTE 6  STRL (DRAPES) ×3
STOCKINETTE 6 STRL (DRAPES) ×2 IMPLANT
SUCTION FRAZIER HANDLE 10FR (MISCELLANEOUS) ×3
SUCTION TUBE FRAZIER 10FR DISP (MISCELLANEOUS) IMPLANT
SUT ETHILON 3 0 PS 1 (SUTURE) ×3 IMPLANT
SUT MNCRL AB 3-0 PS2 18 (SUTURE) ×3 IMPLANT
SUT VICRYL 0 SH 27 (SUTURE) IMPLANT
SYR BULB EAR ULCER 3OZ GRN STR (SYRINGE) ×3 IMPLANT
SYR CONTROL 10ML LL (SYRINGE) IMPLANT
TOWEL GREEN STERILE FF (TOWEL DISPOSABLE) ×3 IMPLANT
TUBE CONNECTING 20X1/4 (TUBING) ×1 IMPLANT
UNDERPAD 30X36 HEAVY ABSORB (UNDERPADS AND DIAPERS) ×3 IMPLANT
YANKAUER SUCT BULB TIP NO VENT (SUCTIONS) IMPLANT

## 2021-06-17 NOTE — Op Note (Signed)
06/17/2021 ? ?12:32 PM ? ?PATIENT:  Miguel [name]  61 y.o. male ? ?PRE-OPERATIVE DIAGNOSIS:   1.  Chronic left plantar fasciitis ?     2.  Left short achilles tendon ? ?POST-OPERATIVE DIAGNOSIS:  same ? ?Procedure(s): ? Left gastrocneimus recession ?2.  Left Plantar fascia release (separate incision) ? ?SURGEON:  Wylene Simmer, MD ? ?ASSISTANT: none ? ?ANESTHESIA:   General, regional ? ?EBL:  minimal  ? ?TOURNIQUET:   ?Total Tourniquet Time Documented: ?Thigh (Left) - 13 minutes ?Total: Thigh (Left) - 13 minutes ? ?COMPLICATIONS:  None apparent ? ?DISPOSITION:  Extubated, awake and stable to recovery. ? ?INDICATION FOR PROCEDURE:  61 y/o male with PMH of smoking, DM and CAD c/o chronic left heel pain worsening over the last year.  He has failed non op treatment and presents for surgical treatment.  The risks and benefits of the alternative treatment options have been discussed in detail.  The patient wishes to proceed with surgery and specifically understands risks of bleeding, infection, nerve damage, blood clots, need for additional surgery, amputation and death.  ? ? ?PROCEDURE IN DETAIL:  After pre operative consent was obtained, and the correct operative site was identified, the patient was brought to the operating room and placed supine on the OR table.  Anesthesia was administered.  Pre-operative antibiotics were administered.  A surgical timeout was taken.  The left lower extremity was prepped and draped in standard sterile fashion with a tourniquet around the thigh.  The extremity was elevated and the tourniquet was inflated to 250 mmHg.  A longitudinal incision was made at the medial calf.  Dissection was carried down through the subcutaneous tissues.  Superficial fascia was incised.  The gastrocnemius tendon was identified.  The sural nerve was protected.  The gastrocnemius tendon was divided from medial to lateral under direct vision.  The wound was irrigated copiously and sprinkled with vancomycin  powder.  Subcutaneous tissues were approximated with Monocryl.  Skin incision was closed with nylon. ? ?Attention was turned to the medial arch.  An incision was made just at the distal aspect of the heel pad adjacent to the medial cord of the plantar fascia.  Dissection was carried sharply down through the subcutaneous tissues.  The plantar soft tissues were protected.  The medial cord was divided under direct vision.  The wound was irrigated and sprinkled with vancomycin powder.  The incision was closed with nylon.  Sterile dressings were applied followed by well-padded short leg splint.  The tourniquet was released after application of the dressings.  The patient was awakened from anesthesia and transported to the recovery room in stable condition. ? ? ?FOLLOW UP PLAN: Nonweightbearing on the left lower extremity for the next 2 weeks.  Aspirin for DVT prophylaxis.  Follow-up in the office in 2 weeks for suture removal and conversion to a cam boot for weightbearing for the following month.   ?

## 2021-06-17 NOTE — Anesthesia Procedure Notes (Signed)
Procedure Name: LMA Insertion ?Date/Time: 06/17/2021 11:49 AM ?Performed by: Glory Buff, CRNA ?Pre-anesthesia Checklist: Patient identified, Emergency Drugs available, Suction available and Patient being monitored ?Patient Re-evaluated:Patient Re-evaluated prior to induction ?Oxygen Delivery Method: Circle system utilized ?Preoxygenation: Pre-oxygenation with 100% oxygen ?Induction Type: IV induction ?LMA: LMA inserted ?LMA Size: 4.0 ?Number of attempts: 1 ?Placement Confirmation: positive ETCO2 ?Tube secured with: Tape ?Dental Injury: Teeth and Oropharynx as per pre-operative assessment  ? ? ? ? ?

## 2021-06-17 NOTE — Progress Notes (Signed)
Assisted Dr. Elgie Congo with left, ultrasound guided, popliteal block. Side rails up, monitors on throughout procedure. See vital signs in flow sheet. Tolerated Procedure well. ?

## 2021-06-17 NOTE — Discharge Instructions (Addendum)
Miguel Simmer, MD ?Rosanne Gutting ? ?Please read the following information regarding your care after surgery. ? ?Medications  ?You only need a prescription for the narcotic pain medicine (ex. oxycodone, Percocet, Norco).  All of the other medicines listed below are available over the counter. ?? Aleve 2 pills twice a day for the first 3 days after surgery. ?? acetominophen (Tylenol) 650 mg every 4-6 hours as you need for minor to moderate pain ?? resume oxycodone as prescribed by Dr. Nelva Bush.  You may supplement with 5 mg oxycodone as needed for severe pain ? ?Narcotic pain medicine (ex. oxycodone, Percocet, Vicodin) will cause constipation.  To prevent this problem, take the following medicines while you are taking any pain medicine. ?? docusate sodium (Colace) 100 mg twice a day ? senna (Senokot) 2 tablets twice a day ? ?? To help prevent blood clots, take a baby aspirin (81 mg) twice a day for two weeks after surgery.  You should also get up every hour while you are awake to move around.   ? ?Weight Bearing ?? Do not bear any weight on the operated leg or foot. ? ?Cast / Splint / Dressing ?? Keep your splint, cast or dressing clean and dry.  Don?t put anything (coat hanger, pencil, etc) down inside of it.  If it gets damp, use a hair dryer on the cool setting to dry it.  If it gets soaked, call the office to schedule an appointment for a cast change. ? ? ?After your dressing, cast or splint is removed; you may shower, but do not soak or scrub the wound.  Allow the water to run over it, and then gently pat it dry. ? ?Swelling ?It is normal for you to have swelling where you had surgery.  To reduce swelling and pain, keep your toes above your nose for at least 3 days after surgery.  It may be necessary to keep your foot or leg elevated for several weeks.  If it hurts, it should be elevated. ? ?Follow Up ?Call my office at [phone] when you are discharged from the hospital or surgery center to schedule an appointment  to be seen two weeks after surgery. ? ?Call my office at [phone] if you develop a fever >101.5? F, nausea, vomiting, bleeding from the surgical site or severe pain.   ? ?Post Anesthesia Home Care Instructions ? ?Activity: ?Get plenty of rest for the remainder of the day. A responsible individual must stay with you for 24 hours following the procedure.  ?For the next 24 hours, DO NOT: ?-Drive a car ?-Paediatric nurse ?-Drink alcoholic beverages ?-Take any medication unless instructed by your physician ?-Make any legal decisions or sign important papers. ? ?Meals: ?Start with liquid foods such as gelatin or soup. Progress to regular foods as tolerated. Avoid greasy, spicy, heavy foods. If nausea and/or vomiting occur, drink only clear liquids until the nausea and/or vomiting subsides. Call your physician if vomiting continues. ? ?Special Instructions/Symptoms: ?Your throat may feel dry or sore from the anesthesia or the breathing tube placed in your throat during surgery. If this causes discomfort, gargle with warm salt water. The discomfort should disappear within 24 hours. ? ?If you had a scopolamine patch placed behind your ear for the management of post- operative nausea and/or vomiting: ? ?1. The medication in the patch is effective for 72 hours, after which it should be removed.  Wrap patch in a tissue and discard in the trash. Wash hands thoroughly with soap and water. ?  2. You may remove the patch earlier than 72 hours if you experience unpleasant side effects which may include dry mouth, dizziness or visual disturbances. ?3. Avoid touching the patch. Wash your hands with soap and water after contact with the patch. ?   Regional Anesthesia Blocks ? ?1. Numbness or the inability to move the "blocked" extremity may last from 3-48 hours after placement. The length of time depends on the medication injected and your individual response to the medication. If the numbness is not going away after 48 hours, call  your surgeon. ? ?2. The extremity that is blocked will need to be protected until the numbness is gone and the  Strength has returned. Because you cannot feel it, you will need to take extra care to avoid injury. Because it may be weak, you may have difficulty moving it or using it. You may not know what position it is in without looking at it while the block is in effect. ? ?3. For blocks in the legs and feet, returning to weight bearing and walking needs to be done carefully. You will need to wait until the numbness is entirely gone and the strength has returned. You should be able to move your leg and foot normally before you try and bear weight or walk. You will need someone to be with you when you first try to ensure you do not fall and possibly risk injury. ? ?4. Bruising and tenderness at the needle site are common side effects and will resolve in a few days. ? ?5. Persistent numbness or new problems with movement should be communicated to the surgeon or the [facility] [phone] [place] [phone]).  ?  ?

## 2021-06-17 NOTE — Anesthesia Postprocedure Evaluation (Signed)
Anesthesia Post Note ? ?Patient: SLATON REASER ? ?Procedure(s) Performed: Left gastroc recession (Left: Leg Lower) ?Plantar fascia release (Left: Foot) ? ?  ? ?Patient location during evaluation: PACU ?Anesthesia Type: Regional and General ?Level of consciousness: awake ?Pain management: pain level controlled ?Vital Signs Assessment: post-procedure vital signs reviewed and stable ?Respiratory status: spontaneous breathing and respiratory function stable ?Cardiovascular status: stable ?Postop Assessment: no apparent nausea or vomiting ?Anesthetic complications: no ? ? ?No notable events documented. ? ?Last Vitals:  ?Vitals:  ? 06/17/21 1245 06/17/21 1329  ?BP: (!) 107/56 130/68  ?Pulse: (!) 53 (!) 52  ?Resp: 13 18  ?Temp:  36.6 ?C  ?SpO2: 94% 98%  ?  ?Last Pain:  ?Vitals:  ? 06/17/21 1329  ?TempSrc:   ?PainSc: 0-No pain  ? ? ?  ?  ?  ?  ?  ?  ? ?Merlinda Frederick ? ? ? ? ?

## 2021-06-17 NOTE — Anesthesia Preprocedure Evaluation (Addendum)
Anesthesia Evaluation  ?Patient identified by MRN, date of birth, ID band ?Patient awake ? ? ? ?Reviewed: ?Allergy & Precautions, NPO status , Patient's Chart, lab work & pertinent test results ? ?Airway ?Mallampati: II ? ?TM Distance: >3 FB ?Neck ROM: Full ? ? ? Dental ? ?(+) Edentulous Lower, Edentulous Upper ?  ?Pulmonary ?shortness of breath, sleep apnea , COPD, Current Smoker and Patient abstained from smoking.,  ?  ?Pulmonary exam normal ?breath sounds clear to auscultation ? ? ? ? ? ? Cardiovascular ?+ Past MI  ?Normal cardiovascular exam ?Rhythm:Regular Rate:Normal ? ? ?  ?Neuro/Psych ?negative neurological ROS ? negative psych ROS  ? GI/Hepatic ?negative GI ROS, Neg liver ROS,   ?Endo/Other  ?negative endocrine ROSdiabetes, Well Controlled, Type 2, Insulin Dependent ? Renal/GU ?negative Renal ROS  ?negative genitourinary ?  ?Musculoskeletal ? ?(+) Arthritis , Osteoarthritis,   ? Abdominal ?  ?Peds ?negative pediatric ROS ?(+)  Hematology ?negative hematology ROS ?(+)   ?Anesthesia Other Findings ? ? Reproductive/Obstetrics ?negative OB ROS ? ?  ? ? ? ? ? ? ? ? ? ? ? ? ? ?  ?  ? ? ? ? ? ? ?Anesthesia Physical ?Anesthesia Plan ? ?ASA: 3 ? ?Anesthesia Plan: Regional and General  ? ?Post-op Pain Management: Regional block*, Minimal or no pain anticipated and Tylenol PO (pre-op)*  ? ?Induction: Intravenous ? ?PONV Risk Score and Plan: 1 and Treatment may vary due to age or medical condition, Ondansetron, Dexamethasone and Midazolam ? ?Airway Management Planned: LMA ? ?Additional Equipment: None ? ?Intra-op Plan:  ? ?Post-operative Plan: Extubation in OR ? ?Informed Consent: I have reviewed the patients History and Physical, chart, labs and discussed the procedure including the risks, benefits and alternatives for the proposed anesthesia with the patient or authorized representative who has indicated his/her understanding and acceptance.  ? ? ? ? ? ?Plan Discussed with:  Anesthesiologist, CRNA and Surgeon ? ?Anesthesia Plan Comments:   ? ? ? ? ? ?Anesthesia Quick Evaluation ? ?

## 2021-06-17 NOTE — H&P (Signed)
Miguel [name] is an 61 y.o. male.   ?Chief Complaint:  left heel pain ?HPI: 61 year old male with a past medical history significant for diabetes and coronary artery disease complains of chronic left heel pain worsening over the last year.  He has failed nonoperative treatment to date including activity modification, oral anti-inflammatories, shoewear modification, physical therapy and immobilization.  He presents now for surgical treatment of this painful and limiting condition. ? ?Past Medical History:  ?Diagnosis Date  ? Arthritis   ? Diabetes mellitus without complication ([place])   ? Heart murmur   ? Myocardial infarction [facility]) 2003  ? Sleep apnea   ? ? ?Past Surgical History:  ?Procedure Laterality Date  ? BACK SURGERY    ? COLONOSCOPY    ? HAND SURGERY Right   ? SHOULDER ARTHROSCOPY WITH ROTATOR CUFF REPAIR AND SUBACROMIAL DECOMPRESSION Left 07/07/2017  ? Procedure: Left shoulder arthroscopy, subacromial decompression, mini open rotator cuff repair;  Surgeon: Susa Day, MD;  Location: [place];  Service: Orthopedics;  Laterality: Left;  ? ? ?Family History  ?Problem Relation Age of Onset  ? AAA (abdominal aortic aneurysm) Father   ? ?Social History:  reports that he has been smoking cigarettes. He has been smoking an average of .75 packs per day. He has never used smokeless tobacco. He reports current alcohol use. He reports that he does not use drugs. ? ?Allergies: No Known Allergies ? ?Medications Prior to Admission  ?Medication Sig Dispense Refill  ? aspirin EC 81 MG tablet Take 81 mg by mouth daily.    ? gabapentin (NEURONTIN) 600 MG tablet Take 600 mg by mouth 3 (three) times daily.    ? Insulin Glargine (LANTUS SOLOSTAR) 100 UNIT/ML Solostar Pen Inject 40 Units into the skin 2 (two) times daily.    ? Oxycodone HCl 10 MG TABS Take 10 mg by mouth 6 (six) times daily.    ? rosuvastatin (CRESTOR) 5 MG tablet Take 5 mg by mouth 3 (three) times a week.    ? sertraline (ZOLOFT) 100 MG tablet Take 100 mg by  mouth at bedtime.    ? sildenafil (REVATIO) 20 MG tablet Take 100 mg by mouth daily as needed (for erectile dysfunction.).     ? tiZANidine (ZANAFLEX) 4 MG tablet Take 4 mg by mouth every 8 (eight) hours as needed for muscle spasms.    ? traZODone (DESYREL) 50 MG tablet Take 50 mg by mouth at bedtime.    ? ? ?Results for orders placed or performed during the hospital encounter of 06/17/21 (from the past 48 hour(s))  ?Basic Metabolic Panel     Status: Abnormal  ? Collection Time: 06/15/21  3:10 PM  ?Result Value Ref Range  ? Sodium 133 (L) 135 - 145 mmol/L  ? Potassium 5.1 3.5 - 5.1 mmol/L  ? Chloride 102 98 - 111 mmol/L  ? CO2 25 22 - 32 mmol/L  ? Glucose, Bld 305 (H) 70 - 99 mg/dL  ?  Comment: Glucose reference range applies only to samples taken after fasting for at least 8 hours.  ? BUN 8 8 - 23 mg/dL  ? Creatinine, Ser 1.14 0.61 - 1.24 mg/dL  ? Calcium 8.9 8.9 - 10.3 mg/dL  ? GFR, Estimated >60 >60 mL/min  ?  Comment: (NOTE) ?Calculated using the CKD-EPI Creatinine Equation (2021) ?  ? Anion gap 6 5 - 15  ?  Comment: Performed at [facility], [place] [address], [place], [place] [address]  ?Glucose, capillary  Status: None  ? Collection Time: 07-06-2021  9:42 AM  ?Result Value Ref Range  ? Glucose-Capillary 91 70 - 99 mg/dL  ?  Comment: Glucose reference range applies only to samples taken after fasting for at least 8 hours.  ? Comment 1 Notify RN   ? Comment 2 Document in Chart   ? ?No results found. ? ?Review of Systems no recent fever, chills, nausea, vomiting or changes in his appetite ? ?Blood pressure 116/65, pulse (!) 50, temperature (!) 97.3 ?F (36.3 ?C), temperature source Oral, resp. rate 17, height '6\' 1"'$  (1.854 m), weight 84.8 kg, SpO2 97 %. ?Physical Exam  ?Well-nourished well-developed man in no apparent distress.  Alert and oriented x4.  Normal mood and affect.  Extraocular motions are intact.  Respirations are unlabored.  Gait is antalgic to the left.  Left heel is tender to palpation at the  origin of the plantar fascia.  Heel cord is tight.  Intact sensibility to light touch around the ankle but diminished at the forefoot dorsally and plantarly.  5 out of 5 strength in plantarflexion and dorsiflexion of the ankle and toes. ? ? ?Assessment/Plan ?Chronic left plantar fasciitis and short Achilles tendon -to the operating room today for gastrocnemius recession and plantar fascial release.  The risks and benefits of the alternative treatment options have been discussed in detail.  The patient wishes to proceed with surgery and specifically understands risks of bleeding, infection, nerve damage, blood clots, need for additional surgery, amputation and death.  ? ?Wylene Simmer, MD ?07-06-2021, 11:26 AM ? ? ? ?

## 2021-06-17 NOTE — Anesthesia Procedure Notes (Addendum)
Anesthesia Regional Block: Popliteal block  ? ?Pre-Anesthetic Checklist: , timeout performed,  Correct Patient, Correct Site, Correct Laterality,  Correct Procedure, Correct Position, site marked,  Risks and benefits discussed,  Surgical consent,  Pre-op evaluation,  At surgeon's request and post-op pain management ? ?Laterality: Left ? ?Prep: chloraprep     ?  ?Needles:  ?Injection technique: Single-shot ? ?Needle Type: Echogenic Stimulator Needle   ? ? ?Needle Length: 10cm  ?Needle Gauge: 20  ? ? ? ?Additional Needles: ? ? ?Procedures:,,,, ultrasound used (permanent image in chart),,    ?Narrative:  ?Start time: 06/17/2021 10:20 AM ?End time: 06/17/2021 10:25 AM ?Injection made incrementally with aspirations every 5 mL. ? ?Performed by: Personally  ?Anesthesiologist: Merlinda Frederick, MD ? ?Additional Notes: ?A functioning IV was confirmed and monitors were applied.  Sterile prep and drape, hand hygiene and sterile gloves were used.  Negative aspiration and test dose prior to incremental administration of local anesthetic. The patient tolerated the procedure well.Ultrasound  guidance: relevant anatomy identified, needle position confirmed, local anesthetic spread visualized around nerve(s), vascular puncture avoided.  Image printed for medical record.  ? ? ? ? ?

## 2021-06-17 NOTE — Transfer of Care (Signed)
Immediate Anesthesia Transfer of Care Note ? ?Patient: Miguel [name] ? ?Procedure(s) Performed: Left gastroc recession (Left: Leg Lower) ?Plantar fascia release (Left: Foot) ? ?Patient Location: PACU ? ?Anesthesia Type:General ? ?Level of Consciousness: drowsy and patient cooperative ? ?Airway & Oxygen Therapy: Patient Spontanous Breathing and Patient connected to face mask oxygen ? ?Post-op Assessment: Report given to RN and Post -op Vital signs reviewed and stable ? ?Post vital signs: Reviewed and stable ? ?Last Vitals:  ?Vitals Value Taken Time  ?BP    ?Temp    ?Pulse 51 06/17/21 1222  ?Resp    ?SpO2 97 % 06/17/21 1222  ?Vitals shown include unvalidated device data. ? ?Last Pain:  ?Vitals:  ? 06/17/21 0948  ?TempSrc: Oral  ?PainSc: 6   ?   ? ?Patients Stated Pain Goal: 6 (06/17/21 2897) ? ?Complications: No notable events documented. ?

## 2021-06-18 ENCOUNTER — Encounter (HOSPITAL_BASED_OUTPATIENT_CLINIC_OR_DEPARTMENT_OTHER): Payer: Self-pay | Admitting: Orthopedic Surgery

## 2021-06-23 ENCOUNTER — Ambulatory Visit: Payer: Self-pay | Admitting: Neurology

## 2021-09-02 ENCOUNTER — Encounter: Payer: Self-pay | Admitting: *Deleted

## 2021-09-03 ENCOUNTER — Ambulatory Visit (INDEPENDENT_AMBULATORY_CARE_PROVIDER_SITE_OTHER): Payer: 59 | Admitting: Neurology

## 2021-09-03 VITALS — BP 129/70 | HR 54 | Ht 72.0 in | Wt 192.6 lb

## 2021-09-03 DIAGNOSIS — R200 Anesthesia of skin: Secondary | ICD-10-CM | POA: Insufficient documentation

## 2021-09-03 MED ORDER — GABAPENTIN 300 MG PO CAPS: 300.0000 mg | ORAL_CAPSULE | Freq: Three times a day (TID) | ORAL | 6 refills | Status: DC

## 2021-09-03 NOTE — Progress Notes (Signed)
Chief Complaint  Patient presents with   New Patient (Initial Visit)    RM 15, alone. Paper referral from Stana Bunting, MD for numbness on top of L foot. Sx started 12/2020. Given cortisone inj but ineffective. Has seen [facility] ortho/Dr. Huwett around 04/2021. Sore developed on side of L foot. He had metal in there that was removed. Felt he may have plantar faciatis. Placed in boot. Had ESI right side of back for sciatic pain. Sx worsened in L foot over time. Lost feeling in foot after ESI. Stopped wearing boot. Went ER/[facility] after. Recommended he see neurologist.    PCP   Other    Had surgery on left foot 06/17/2021. It was for plantar fasciitis. Still having pain on top of foot.       ASSESSMENT AND PLAN  Miguel [name] is a 61 y.o. male   Left foot numbness  In the distribution of left superficial and deep peroneal, suggestive of left common peroneal involvement, no significant motor weakness noted,  Tenderness at left fibular head,  Differentiation diagnosis includes left common peroneal neuropathy across left fibular head  EMG nerve conduction study  Gabapentin 300 mg as needed    DIAGNOSTIC DATA (LABS, IMAGING, TESTING) - I reviewed patient records, labs, notes, testing and imaging myself where available. September 22, normal CMP, A1c 6.5, LDL 109,  MEDICAL HISTORY:  Miguel [name] is a 61 year old male, seen in request by his primary care from [facility], for evaluation of left toe numbness, initial evaluation was only 26 2023.  I reviewed and summarized the referring note. PMHX. DM HTN HLD Depression CAD Obstrucive  sleep apnea. Smoke Lumbar decompression x5, last was in 2021.  Reported long history of lumbar degenerative disease, underwent multiple surgeries in the past, most recent 1 was in 2021, he had frequent flareup of right-sided low back pain, oftentimes radiating to right lower extremity, only  occasionally went to left lower extremity  Since 2022, he began to experience left heel pain, was treated by a local podiatrist first, had steroid injection left heel,  During his visit to [facility] in early 2023, he was seen by PA, per patient, was diagnosed with left plantars fasciitis, was instructed to wear a cam boot constantly since January  Patient reported the second day he began to wear left Cam Boot, he noticed mild numbness at the top of left lateral toe, continue to progress, 3 days later by January 26, he has developed more intense left toe numbness, top of left arch discomfort, despite taking gabapentin, frozen water bottles, insoles, he actually presented to [facility] long emergency room, also continues to have left heel pain  He is stopping wearing left Cam Boot since,  He was later seen by foot specialist Dr. Wylene Simmer, diagnosed with chronic left plantar fasciitis, short Achilles tendon, underwent left gastrocnemius recession and plantar fascial release, with longitudinal incision made at medial calf, the gastrocnemius tendon was divided from medial to lateral under direct vision  Incision was also made at the distal aspect of the hair pad adjacent to the medial cord of the plantar fascia, dissection was carried sharply down through the subcutaneous tissues, the medial cord was divided under direct vision,  Postsurgically, he wear Cam Boot for a while, now has progressed to regular shoes, he continue complains of left plantar surface discomfort close to the left medial plantar incision site, the most bothersome symptoms is his persistent numbness of left toes,  extending to left leg,  He denies right-sided involvement, denies radiating pain from back to left lower extremity, denies bowel or bladder incontinence, gait limitation is mainly due to left foot pain  PHYSICAL EXAM:   Vitals:   09/03/21 1104  BP: 129/70  Pulse: (!) 54  Weight: 192 lb 9.6 oz (87.4 kg)  Height: 6'  (1.829 m)   Not recorded     Body mass index is 26.12 kg/m.  PHYSICAL EXAMNIATION:  Gen: NAD, conversant, well nourised, well groomed                     Cardiovascular: Regular rate rhythm, no peripheral edema, warm, nontender. Eyes: Conjunctivae clear without exudates or hemorrhage Neck: Supple, no carotid bruits. Pulmonary: Clear to auscultation bilaterally   NEUROLOGICAL EXAM:  MENTAL STATUS: Speech/cognition: Awake, alert, oriented to history taking and casual conversation CRANIAL NERVES: CN II: Visual fields are full to confrontation. Pupils are round equal and briskly reactive to light. CN III, IV, VI: extraocular movement are normal. No ptosis. CN V: Facial sensation is intact to light touch CN VII: Face is symmetric with normal eye closure  CN VIII: Hearing is normal to causal conversation. CN IX, X: Phonation is normal. CN XI: Head turning and shoulder shrug are intact  MOTOR: Well-healed left medial plantar incision, tenderness upon deep palpitation close to incision site, there is no significant bilateral lower extremity proximal and distal muscle weakness, well-preserved bilateral posterior tibial artery pulse,  REFLEXES: Reflexes are 2+ and symmetric at the biceps, triceps, knees, and trace at right side, and absent on the left ankles. Plantar responses are flexor.  He has significant tenderness along left fibular head  SENSORY: Decreased light touch pinprick at the left first webspace, extending to left lateral dorsal foot, lateral leg,  COORDINATION: There is no trunk or limb dysmetria noted.  GAIT/STANCE: Mildly antalgic due to left foot pain,  REVIEW OF SYSTEMS:  Full 14 system review of systems performed and notable only for as above All other review of systems were negative.   ALLERGIES: Allergies  Allergen Reactions   Cymbalta [Duloxetine Hcl]     agitation   Statins     Fatigue, myalgias (lipitor, zocor, crestor)    HOME  MEDICATIONS: Current Outpatient Medications  Medication Sig Dispense Refill   ASPIRIN 81 PO Take 1 tablet by mouth daily.     budesonide-formoterol (SYMBICORT) 160-4.5 MCG/ACT inhaler Inhale 2 puffs into the lungs 2 (two) times daily.     Dulaglutide (TRULICITY) 9.98 PJ/8.2NK SOPN Inject 0.75 mg into the skin once a week.     gabapentin (NEURONTIN) 600 MG tablet Take 1,800 mg by mouth at bedtime.     Insulin Glargine (LANTUS SOLOSTAR) 100 UNIT/ML Solostar Pen Inject 30 Units into the skin 2 (two) times daily.     lisinopril (ZESTRIL) 5 MG tablet Take 5 mg by mouth daily.     meloxicam (MOBIC) 7.5 MG tablet Take 7.5 mg by mouth as needed for pain.     Oxycodone HCl 20 MG TABS Take 1 tablet by mouth 6 (six) times daily.     rosuvastatin (CRESTOR) 5 MG tablet Take 5 mg by mouth 3 (three) times a week.     sertraline (ZOLOFT) 100 MG tablet Take 100 mg by mouth at bedtime.     tadalafil (CIALIS) 20 MG tablet Take 20 mg by mouth daily as needed for erectile dysfunction.     tiZANidine (ZANAFLEX) 4 MG tablet Take 4  mg by mouth every 8 (eight) hours as needed for muscle spasms.     traZODone (DESYREL) 50 MG tablet Take 50 mg by mouth at bedtime.     zolpidem (AMBIEN) 10 MG tablet Take 10 mg by mouth at bedtime as needed for sleep.     No current facility-administered medications for this visit.    PAST MEDICAL HISTORY: Past Medical History:  Diagnosis Date   Anxiety    Arthritis    Depression    Diabetes mellitus without complication ([place])    Erectile dysfunction    Heart murmur    Hyperlipidemia    Hypertension    Myocardial infarction ([place]) 2003   Sleep apnea     PAST SURGICAL HISTORY: Past Surgical History:  Procedure Laterality Date   BACK SURGERY     COLONOSCOPY     GASTROCNEMIUS RECESSION Left 06/17/2021   Procedure: Left gastroc recession;  Surgeon: Wylene Simmer, MD;  Location: [place];  Service: Orthopedics;  Laterality: Left;   HAND SURGERY Right     LAMINECTOMY  07/2001   PLANTAR FASCIA RELEASE Left 06/17/2021   Procedure: Plantar fascia release;  Surgeon: Wylene Simmer, MD;  Location: [place];  Service: Orthopedics;  Laterality: Left;   SHOULDER ARTHROSCOPY WITH ROTATOR CUFF REPAIR AND SUBACROMIAL DECOMPRESSION Left 07/07/2017   Procedure: Left shoulder arthroscopy, subacromial decompression, mini open rotator cuff repair;  Surgeon: Susa Day, MD;  Location: [place];  Service: Orthopedics;  Laterality: Left;    FAMILY HISTORY: Family History  Problem Relation Age of Onset   AAA (abdominal aortic aneurysm) Father     SOCIAL HISTORY: Social History   Socioeconomic History   Marital status: Married    Spouse name: Not on file   Number of children: Not on file   Years of education: Not on file   Highest education level: Not on file  Occupational History   Not on file  Tobacco Use   Smoking status: Every Day    Packs/day: 0.75    Types: Cigarettes   Smokeless tobacco: Never  Vaping Use   Vaping Use: Never used  Substance and Sexual Activity   Alcohol use: Yes    Comment: occasional   Drug use: Never   Sexual activity: Not on file  Other Topics Concern   Not on file  Social History Narrative   Right handed       On disability   Social Determinants of Health   Financial Resource Strain: Not on file  Food Insecurity: Not on file  Transportation Needs: Not on file  Physical Activity: Not on file  Stress: Not on file  Social Connections: Not on file  Intimate Partner Violence: Not on file      Marcial Pacas, M.D. Ph.D.  [facility] [address] [place], [place] [address] Ph: [phone] Fax: [phone]  CC:  Christopher Nation, MD [place],  [place] [address]  Rory Percy, MD

## 2021-09-22 ENCOUNTER — Encounter: Payer: Medicare Other | Admitting: Neurology

## 2021-09-22 ENCOUNTER — Encounter: Payer: Self-pay | Admitting: Neurology

## 2021-09-22 ENCOUNTER — Ambulatory Visit (INDEPENDENT_AMBULATORY_CARE_PROVIDER_SITE_OTHER): Payer: 59 | Admitting: Neurology

## 2021-09-22 DIAGNOSIS — G5732 Lesion of lateral popliteal nerve, left lower limb: Secondary | ICD-10-CM

## 2021-09-22 DIAGNOSIS — R200 Anesthesia of skin: Secondary | ICD-10-CM

## 2021-09-22 DIAGNOSIS — Z0289 Encounter for other administrative examinations: Secondary | ICD-10-CM

## 2021-09-22 NOTE — Progress Notes (Signed)
ASSESSMENT AND PLAN  Miguel [name] is a 61 y.o. male   Left foot numbness  Most likely is a mild left superficial peroneal neuropathy at pressure of left fibular head region, only has mild sensory component, no evidence of motor deficit, hope to continue improve  He does have tenderness along left fibular head, advised him avoiding pressure there,  Will only return to clinic for new issues   DIAGNOSTIC DATA (LABS, IMAGING, TESTING) - I reviewed patient records, labs, notes, testing and imaging myself where available. September 22, normal CMP, A1c 6.5, LDL 109,  MEDICAL HISTORY:  Miguel [name] is a 61 year old male, seen in request by [facility] [place] [facility], for evaluation of left toe numbness, initial evaluation was on Sep 03 2021.  I reviewed and summarized the referring note. PMHX. DM HTN HLD Depression CAD Obstrucive  sleep apnea. Smoke Lumbar decompression x5, last was in 2021.  Reported long history of lumbar degenerative disease, underwent multiple surgeries in the past, most recent 1 was in 2021, he had frequent flareup of right-sided low back pain, oftentimes radiating to right lower extremity, only occasionally went to left lower extremity  Since 2022, he began to experience left heel pain, was treated by a local podiatrist first, had steroid injection left heel,  During his visit to [facility] in early 2023, he was seen by PA, per patient, was diagnosed with left plantars fasciitis, was instructed to wear a cam boot constantly since January  Patient reported the second day he began to wear left Cam Boot, he noticed mild numbness at the top of left lateral toe, continue to progress, 3 days later by January 26, he has developed more intense left toe numbness, top of left arch discomfort, despite taking gabapentin, frozen water bottles, insoles, he actually presented to [facility] long emergency room, also continues  to have left heel pain  He is stopping wearing left Cam Boot since,  He was later seen by foot specialist Dr. Wylene Simmer, diagnosed with chronic left plantar fasciitis, short Achilles tendon, underwent left gastrocnemius recession and plantar fascial release, with longitudinal incision made at medial calf, the gastrocnemius tendon was divided from medial to lateral under direct vision  Incision was also made at the distal aspect of the heel pad adjacent to the medial cord of the plantar fascia, dissection was carried sharply down through the subcutaneous tissues, the medial cord was divided under direct vision,  Postsurgically, he wear Cam Boot for a while, now has progressed to regular shoes, he continue complains of left plantar surface discomfort close to the left medial plantar incision site, the most bothersome symptoms is his persistent numbness of left toes, extending to left leg,  He denies right-sided involvement, denies radiating pain from back to left lower extremity, denies bowel or bladder incontinence, gait limitation is mainly due to left foot pain  PHYSICAL EXAM:     PHYSICAL EXAMNIATION:  Gen: NAD, conversant, well nourised, well groomed                     Cardiovascular: Regular rate rhythm, no peripheral edema, warm, nontender. Eyes: Conjunctivae clear without exudates or hemorrhage Neck: Supple, no carotid bruits. Pulmonary: Clear to auscultation bilaterally   NEUROLOGICAL EXAM:  MENTAL STATUS: Speech/cognition: Awake, alert, oriented to history taking and casual conversation CRANIAL NERVES: CN II: Visual fields are full to confrontation. Pupils are round equal and briskly reactive to light. CN  III, IV, VI: extraocular movement are normal. No ptosis. CN V: Facial sensation is intact to light touch CN VII: Face is symmetric with normal eye closure  CN VIII: Hearing is normal to causal conversation. CN IX, X: Phonation is normal. CN XI: Head turning and shoulder  shrug are intact  MOTOR: Well-healed left medial plantar incision, tenderness upon deep palpitation close to incision site, there is no significant bilateral lower extremity proximal and distal muscle weakness, well-preserved bilateral posterior tibial artery pulse,  REFLEXES: Reflexes are 2+ and symmetric at the biceps, triceps, knees, and trace at right side, and absent on the left ankles. Plantar responses are flexor.  He has significant tenderness along left fibular head  SENSORY: Decreased light touch pinprick at the left first webspace, extending to left lateral dorsal foot, lateral leg,  COORDINATION: There is no trunk or limb dysmetria noted.  GAIT/STANCE: Mildly antalgic due to left foot pain,  REVIEW OF SYSTEMS:  Full 14 system review of systems performed and notable only for as above All other review of systems were negative.   ALLERGIES: Allergies  Allergen Reactions   Cymbalta [Duloxetine Hcl]     agitation   Statins     Fatigue, myalgias (lipitor, zocor, crestor)    HOME MEDICATIONS: Current Outpatient Medications  Medication Sig Dispense Refill   ASPIRIN 81 PO Take 1 tablet by mouth daily.     budesonide-formoterol (SYMBICORT) 160-4.5 MCG/ACT inhaler Inhale 2 puffs into the lungs 2 (two) times daily.     Dulaglutide (TRULICITY) 9.38 HW/2.9HB SOPN Inject 0.75 mg into the skin once a week.     gabapentin (NEURONTIN) 300 MG capsule Take 1 capsule (300 mg total) by mouth 3 (three) times daily. 90 capsule 6   gabapentin (NEURONTIN) 600 MG tablet Take 1,800 mg by mouth at bedtime.     Insulin Glargine (LANTUS SOLOSTAR) 100 UNIT/ML Solostar Pen Inject 30 Units into the skin 2 (two) times daily.     lisinopril (ZESTRIL) 5 MG tablet Take 5 mg by mouth daily.     meloxicam (MOBIC) 7.5 MG tablet Take 7.5 mg by mouth as needed for pain.     Oxycodone HCl 20 MG TABS Take 1 tablet by mouth 6 (six) times daily.     rosuvastatin (CRESTOR) 5 MG tablet Take 5 mg by mouth 3  (three) times a week.     sertraline (ZOLOFT) 100 MG tablet Take 100 mg by mouth at bedtime.     tadalafil (CIALIS) 20 MG tablet Take 20 mg by mouth daily as needed for erectile dysfunction.     tiZANidine (ZANAFLEX) 4 MG tablet Take 4 mg by mouth every 8 (eight) hours as needed for muscle spasms.     traZODone (DESYREL) 50 MG tablet Take 50 mg by mouth at bedtime.     zolpidem (AMBIEN) 10 MG tablet Take 10 mg by mouth at bedtime as needed for sleep.     No current facility-administered medications for this visit.    PAST MEDICAL HISTORY: Past Medical History:  Diagnosis Date   Anxiety    Arthritis    Depression    Diabetes mellitus without complication ([place])    Erectile dysfunction    Heart murmur    Hyperlipidemia    Hypertension    Myocardial infarction ([place]) 2003   Sleep apnea     PAST SURGICAL HISTORY: Past Surgical History:  Procedure Laterality Date   BACK SURGERY     COLONOSCOPY     GASTROCNEMIUS RECESSION  Left 06/17/2021   Procedure: Left gastroc recession;  Surgeon: Wylene Simmer, MD;  Location: [place];  Service: Orthopedics;  Laterality: Left;   HAND SURGERY Right    LAMINECTOMY  07/2001   PLANTAR FASCIA RELEASE Left 06/17/2021   Procedure: Plantar fascia release;  Surgeon: Wylene Simmer, MD;  Location: [place];  Service: Orthopedics;  Laterality: Left;   SHOULDER ARTHROSCOPY WITH ROTATOR CUFF REPAIR AND SUBACROMIAL DECOMPRESSION Left 07/07/2017   Procedure: Left shoulder arthroscopy, subacromial decompression, mini open rotator cuff repair;  Surgeon: Susa Day, MD;  Location: [place];  Service: Orthopedics;  Laterality: Left;    FAMILY HISTORY: Family History  Problem Relation Age of Onset   AAA (abdominal aortic aneurysm) Father     SOCIAL HISTORY: Social History   Socioeconomic History   Marital status: Married    Spouse name: Not on file   Number of children: Not on file   Years of education: Not on file    Highest education level: Not on file  Occupational History   Not on file  Tobacco Use   Smoking status: Every Day    Packs/day: 0.75    Types: Cigarettes   Smokeless tobacco: Never  Vaping Use   Vaping Use: Never used  Substance and Sexual Activity   Alcohol use: Yes    Comment: occasional   Drug use: Never   Sexual activity: Not on file  Other Topics Concern   Not on file  Social History Narrative   Right handed       On disability   Social Determinants of Health   Financial Resource Strain: Not on file  Food Insecurity: Not on file  Transportation Needs: Not on file  Physical Activity: Not on file  Stress: Not on file  Social Connections: Not on file  Intimate Partner Violence: Not on file      Marcial Pacas, M.D. Ph.D.  [facility] [address], [place], [place] [address] Ph: [phone] Fax: [phone]  CC:  Rory Percy, MD [place],  [place] [id number]  Rory Percy, MD

## 2021-09-22 NOTE — Procedures (Signed)
Full Name: Miguel [name] Gender: Male MRN #: [id number] Date of Birth: 1960/10/16    Visit Date: 09/22/2021 07:53 Age: 61 Years Examining Physician: Marcial Pacas Referring Physician: Marcial Pacas Height: 6 feet 0 inch History: 61 years old male, complains of top of left foot numbness since wear left Cam boot Early 23  On examination: Bilateral ankle dorsiflexion, plantarflexion, eversion, inversion was normal.  Knee reflex was present, trace ankle reflex bilaterally.  Mild decreased light touch at top of left foot, most noticeable between first webspace  Summary of the test: Nerve conduction study: Bilateral sural, right superficial peroneal sensory responses were normal.  Left superficial peroneal sensory responses were absent.  Right peroneal motor, bilateral tibial motor responses were within normal limit.  Left peroneal to EDB motor response showed mildly decreased CMAP amplitude, mild slowing of conduction velocity.  Bilateral peroneal to tibialis anterior motor response were present, no amplitude drop across bilateral fibular head.  Electromyography: Selected needle examinations were performed at bilateral lower extremity muscles, and bilateral lower lumbar paraspinal muscles.  There is evidence of chronic neuropathic changes mainly involving the left L5, S1 myotomes,    There was no spontaneous activity at well-healed midline lumbar scar   Conclusion: This is a slight abnormal study.  There is electrodiagnostic evidence of left superficial peroneal neuropathy, mild, mainly involving sensory component, with well-preserved motor function, potential localized to left fibular head.  He does have mild tenderness along left fibular head with deep palpitation.  I have advised him avoiding pressure at left fibular head region.    ------------------------------- Marcial Pacas M.D. PhD  [facility] [address], [place], [place] [address] Tel:  [phone] Fax: [phone]  Verbal informed consent was obtained from the patient, patient was informed of potential risk of procedure, including bruising, bleeding, hematoma formation, infection, muscle weakness, muscle pain, numbness, among others.        [place]    Nerve / Sites Muscle Latency Ref. Amplitude Ref. Rel Amp Segments Distance Velocity Ref. Area    ms ms mV mV %  cm m/s m/s mVms  R Peroneal - EDB     Ankle EDB 6.4 ?6.5 2.5 ?2.0 100 Ankle - EDB 9   8.9     Fib head EDB 14.5  2.3  93.6 Fib head - Ankle 33 41 ?44 9.6     Pop fossa EDB 17.4  2.2  95 Pop fossa - Fib head 12 42 ?44 7.7         Pop fossa - Ankle      L Peroneal - EDB     Ankle EDB 5.7 ?6.5 1.1 ?2.0 100 Ankle - EDB 9   3.5     Fib head EDB 14.5  0.9  80.3 Fib head - Ankle 33 37 ?44 2.9     Pop fossa EDB 18.8  0.9  102 Pop fossa - Fib head 17 39 ?44 2.8         Pop fossa - Ankle      R Tibial - AH     Ankle AH 4.6 ?5.8 12.0 ?4.0 100 Ankle - AH 9   26.1     Pop fossa AH 16.5  8.3  68.9 Pop fossa - Ankle 49 41 ?41 21.7  L Tibial - AH     Ankle AH 5.4 ?5.8 5.4 ?4.0 100 Ankle - AH 9   11.8     Pop fossa AH 17.9  3.3  60.6 Pop fossa - Ankle 51 41 ?41 11.0  L Peroneal - Tib Ant     Fib Head Tib Ant 4.7 ?4.7 6.3 ?3.0 100 Fib Head - Tib Ant 10   44.5     Pop fossa Tib Ant 7.6  6.3  101 Pop fossa - Fib Head 14 49 ?44 44.5  R Peroneal - Tib Ant     Fib Head Tib Ant 4.0 ?4.7 7.2 ?3.0 100 Fib Head - Tib Ant 10   50.6     Pop fossa Tib Ant 6.3  7.0  96.3 Pop fossa - Fib Head 10 44 ?44 56.0                 SNC    Nerve / Sites Rec. Site Peak Lat Ref.  Amp Ref. Segments Distance    ms ms V V  cm  R Sural - Ankle (Calf)     Calf Ankle 4.1 ?4.4 14 ?6 Calf - Ankle 14  L Sural - Ankle (Calf)     Calf Ankle 4.1 ?4.4 13 ?6 Calf - Ankle 14  R Superficial peroneal - Ankle     Lat leg Ankle 4.2 ?4.4 7 ?6 Lat leg - Ankle 14  L Superficial peroneal - Ankle     Lat leg Ankle NR ?4.4 NR ?6 Lat leg - Ankle 14             F   Wave    Nerve F Lat Ref.   ms ms  R Tibial - AH 59.9 ?56.0  L Tibial - AH 63.1 ?56.0           EMG Summary Table    Spontaneous MUAP Recruitment  Muscle IA Fib PSW Fasc Other Amp Dur. Poly Pattern  R. Tibialis anterior Normal None None None _______ Normal Normal Normal Normal  R. Tibialis posterior Normal None None None _______ Normal Normal Normal Normal  R. Gastrocnemius (Medial head) Normal None None None _______ Normal Normal Normal Normal  R. Vastus lateralis Normal None None None _______ Normal Normal Normal Normal  L. Tibialis anterior Normal None None None _______ Normal Normal Normal Normal  L. Tibialis posterior Increased None None None _______ Normal Normal Normal Reduced  L. Peroneus longus Normal None None None _______ Normal Normal Normal Normal  L. Gastrocnemius (Medial head) Increased None None None _______ Normal Normal Normal Reduced  L. Vastus lateralis Normal None None None _______ Normal Normal Normal Normal  R. Lumbar paraspinals (low) Normal None None None _______ Normal Normal Normal Normal  R. Lumbar paraspinals (mid) Normal None None None _______ Normal Normal Normal Normal  L. Lumbar paraspinals (low) Normal None None None _______ Normal Normal Normal Normal  L. Lumbar paraspinals (mid) Normal None None None _______ Normal Normal Normal Normal

## 2021-09-23 NOTE — Progress Notes (Signed)
Nerve conduction study report is under procedure

## 2021-11-08 ENCOUNTER — Inpatient Hospital Stay (HOSPITAL_COMMUNITY)
Admission: EM | Admit: 2021-11-08 | Discharge: 2021-11-11 | DRG: 390 | Disposition: A | Discharge status: Home / Self Care | Payer: 59 | Attending: Internal Medicine | Admitting: Internal Medicine

## 2021-11-08 ENCOUNTER — Emergency Department (HOSPITAL_COMMUNITY): Payer: 59

## 2021-11-08 ENCOUNTER — Other Ambulatory Visit: Payer: Self-pay

## 2021-11-08 ENCOUNTER — Encounter (HOSPITAL_COMMUNITY): Payer: Self-pay

## 2021-11-08 DIAGNOSIS — J449 Chronic obstructive pulmonary disease, unspecified: Secondary | ICD-10-CM | POA: Diagnosis not present

## 2021-11-08 DIAGNOSIS — E1142 Type 2 diabetes mellitus with diabetic polyneuropathy: Secondary | ICD-10-CM | POA: Diagnosis present

## 2021-11-08 DIAGNOSIS — Z794 Long term (current) use of insulin: Secondary | ICD-10-CM

## 2021-11-08 DIAGNOSIS — Z20822 Contact with and (suspected) exposure to covid-19: Secondary | ICD-10-CM | POA: Diagnosis present

## 2021-11-08 DIAGNOSIS — Z87891 Personal history of nicotine dependence: Secondary | ICD-10-CM

## 2021-11-08 DIAGNOSIS — Z981 Arthrodesis status: Secondary | ICD-10-CM

## 2021-11-08 DIAGNOSIS — Z7982 Long term (current) use of aspirin: Secondary | ICD-10-CM

## 2021-11-08 DIAGNOSIS — G894 Chronic pain syndrome: Secondary | ICD-10-CM | POA: Diagnosis present

## 2021-11-08 DIAGNOSIS — M5136 Other intervertebral disc degeneration, lumbar region: Secondary | ICD-10-CM | POA: Diagnosis present

## 2021-11-08 DIAGNOSIS — K56600 Partial intestinal obstruction, unspecified as to cause: Principal | ICD-10-CM | POA: Diagnosis present

## 2021-11-08 DIAGNOSIS — I252 Old myocardial infarction: Secondary | ICD-10-CM

## 2021-11-08 DIAGNOSIS — D72829 Elevated white blood cell count, unspecified: Secondary | ICD-10-CM

## 2021-11-08 DIAGNOSIS — R011 Cardiac murmur, unspecified: Secondary | ICD-10-CM | POA: Diagnosis present

## 2021-11-08 DIAGNOSIS — Z79899 Other long term (current) drug therapy: Secondary | ICD-10-CM

## 2021-11-08 DIAGNOSIS — K56609 Unspecified intestinal obstruction, unspecified as to partial versus complete obstruction: Secondary | ICD-10-CM | POA: Diagnosis present

## 2021-11-08 DIAGNOSIS — G5732 Lesion of lateral popliteal nerve, left lower limb: Secondary | ICD-10-CM | POA: Diagnosis present

## 2021-11-08 DIAGNOSIS — E785 Hyperlipidemia, unspecified: Secondary | ICD-10-CM | POA: Diagnosis present

## 2021-11-08 DIAGNOSIS — E1140 Type 2 diabetes mellitus with diabetic neuropathy, unspecified: Secondary | ICD-10-CM

## 2021-11-08 DIAGNOSIS — R112 Nausea with vomiting, unspecified: Secondary | ICD-10-CM

## 2021-11-08 DIAGNOSIS — E119 Type 2 diabetes mellitus without complications: Secondary | ICD-10-CM

## 2021-11-08 DIAGNOSIS — Z888 Allergy status to other drugs, medicaments and biological substances status: Secondary | ICD-10-CM

## 2021-11-08 DIAGNOSIS — M48061 Spinal stenosis, lumbar region without neurogenic claudication: Secondary | ICD-10-CM | POA: Diagnosis present

## 2021-11-08 DIAGNOSIS — F419 Anxiety disorder, unspecified: Secondary | ICD-10-CM | POA: Diagnosis present

## 2021-11-08 DIAGNOSIS — M51369 Other intervertebral disc degeneration, lumbar region without mention of lumbar back pain or lower extremity pain: Secondary | ICD-10-CM | POA: Diagnosis present

## 2021-11-08 DIAGNOSIS — D75839 Thrombocytosis, unspecified: Secondary | ICD-10-CM

## 2021-11-08 DIAGNOSIS — I10 Essential (primary) hypertension: Secondary | ICD-10-CM | POA: Diagnosis present

## 2021-11-08 DIAGNOSIS — F32A Depression, unspecified: Secondary | ICD-10-CM | POA: Diagnosis present

## 2021-11-08 LAB — CBC
HCT: 42.7 % (ref 39.0–52.0)
Hemoglobin: 14.2 g/dL (ref 13.0–17.0)
MCH: 28.8 pg (ref 26.0–34.0)
MCHC: 33.3 g/dL (ref 30.0–36.0)
MCV: 86.6 fL (ref 80.0–100.0)
Platelets: 437 10*3/uL — ABNORMAL HIGH (ref 150–400)
RBC: 4.93 MIL/uL (ref 4.22–5.81)
RDW: 13.5 % (ref 11.5–15.5)
WBC: 11.3 10*3/uL — ABNORMAL HIGH (ref 4.0–10.5)
nRBC: 0 % (ref 0.0–0.2)

## 2021-11-08 LAB — COMPREHENSIVE METABOLIC PANEL
ALT: 20 U/L (ref 0–44)
AST: 20 U/L (ref 15–41)
Albumin: 3.7 g/dL (ref 3.5–5.0)
Alkaline Phosphatase: 94 U/L (ref 38–126)
Anion gap: 15 (ref 5–15)
BUN: 23 mg/dL (ref 8–23)
CO2: 28 mmol/L (ref 22–32)
Calcium: 9.4 mg/dL (ref 8.9–10.3)
Chloride: 95 mmol/L — ABNORMAL LOW (ref 98–111)
Creatinine, Ser: 0.99 mg/dL (ref 0.61–1.24)
GFR, Estimated: >60 mL/min (ref >60)
Glucose, Bld: 202 mg/dL — ABNORMAL HIGH (ref 70–99)
Potassium: 4 mmol/L (ref 3.5–5.1)
Sodium: 138 mmol/L (ref 135–145)
Total Bilirubin: 0.8 mg/dL (ref 0.3–1.2)
Total Protein: 8.5 g/dL — ABNORMAL HIGH (ref 6.5–8.1)

## 2021-11-08 LAB — URINALYSIS, ROUTINE W REFLEX MICROSCOPIC
Bilirubin Urine: NEGATIVE
Glucose, UA: NEGATIVE mg/dL
Hgb urine dipstick: NEGATIVE
Ketones, ur: 20 mg/dL — AB
Leukocytes,Ua: NEGATIVE
Nitrite: NEGATIVE
Protein, ur: NEGATIVE mg/dL
Specific Gravity, Urine: >1.046 — ABNORMAL HIGH (ref 1.005–1.030)
pH: 6 (ref 5.0–8.0)

## 2021-11-08 LAB — TROPONIN I (HIGH SENSITIVITY)
Troponin I (High Sensitivity): 11 ng/L (ref <18)
Troponin I (High Sensitivity): 13 ng/L (ref <18)

## 2021-11-08 LAB — LIPASE, BLOOD: Lipase: 47 U/L (ref 11–51)

## 2021-11-08 LAB — GLUCOSE, CAPILLARY: Glucose-Capillary: 137 mg/dL — ABNORMAL HIGH (ref 70–99)

## 2021-11-08 MED ORDER — SODIUM CHLORIDE 0.9 % IV BOLUS
1000.0000 mL | Freq: Once | INTRAVENOUS | Status: AC
  Administered 2021-11-08: 1000 mL via INTRAVENOUS

## 2021-11-08 MED ORDER — ACETAMINOPHEN 650 MG RE SUPP
650.0000 mg | Freq: Four times a day (QID) | RECTAL | Status: DC | PRN
  Administered 2021-11-08: 650 mg via RECTAL
  Filled 2021-11-08: qty 1

## 2021-11-08 MED ORDER — SODIUM CHLORIDE 0.9% FLUSH
3.0000 mL | Freq: Two times a day (BID) | INTRAVENOUS | Status: DC
  Administered 2021-11-08 – 2021-11-11 (×4): 3 mL via INTRAVENOUS

## 2021-11-08 MED ORDER — ONDANSETRON 4 MG PO TBDP
4.0000 mg | ORAL_TABLET | Freq: Once | ORAL | Status: AC | PRN
  Administered 2021-11-08: 4 mg via ORAL
  Filled 2021-11-08: qty 1

## 2021-11-08 MED ORDER — TRAZODONE HCL 50 MG PO TABS
50.0000 mg | ORAL_TABLET | Freq: Every evening | ORAL | Status: DC | PRN
Start: 2021-11-08 — End: 2021-11-11
  Administered 2021-11-09 – 2021-11-10 (×3): 50 mg via ORAL
  Filled 2021-11-08 (×3): qty 1

## 2021-11-08 MED ORDER — IOHEXOL 300 MG/ML  SOLN
100.0000 mL | Freq: Once | INTRAMUSCULAR | Status: AC | PRN
  Administered 2021-11-08: 100 mL via INTRAVENOUS

## 2021-11-08 MED ORDER — INSULIN ASPART 100 UNIT/ML IJ SOLN
0.0000 [IU] | INTRAMUSCULAR | Status: DC
  Administered 2021-11-08: 2 [IU] via SUBCUTANEOUS
  Administered 2021-11-09 (×2): 3 [IU] via SUBCUTANEOUS
  Administered 2021-11-09: 2 [IU] via SUBCUTANEOUS
  Administered 2021-11-09 – 2021-11-10 (×2): 3 [IU] via SUBCUTANEOUS
  Administered 2021-11-10: 5 [IU] via SUBCUTANEOUS
  Administered 2021-11-10: 2 [IU] via SUBCUTANEOUS
  Administered 2021-11-10: 3 [IU] via SUBCUTANEOUS
  Administered 2021-11-11: 8 [IU] via SUBCUTANEOUS
  Administered 2021-11-11: 2 [IU] via SUBCUTANEOUS
  Administered 2021-11-11: 3 [IU] via SUBCUTANEOUS
  Filled 2021-11-08: qty 0.15

## 2021-11-08 MED ORDER — ACETAMINOPHEN 325 MG PO TABS: 650.0000 mg | ORAL_TABLET | Freq: Four times a day (QID) | ORAL | Status: DC | PRN

## 2021-11-08 MED ORDER — ONDANSETRON HCL 4 MG/2ML IJ SOLN
4.0000 mg | Freq: Once | INTRAMUSCULAR | Status: AC
  Administered 2021-11-08: 4 mg via INTRAVENOUS
  Filled 2021-11-08: qty 2

## 2021-11-08 MED ORDER — HYDROMORPHONE HCL 1 MG/ML IJ SOLN
0.5000 mg | INTRAMUSCULAR | Status: DC | PRN
  Administered 2021-11-09 – 2021-11-10 (×4): 1 mg via INTRAVENOUS
  Filled 2021-11-08 (×4): qty 1

## 2021-11-08 MED ORDER — SODIUM CHLORIDE 0.9 % IV SOLN
INTRAVENOUS | Status: AC
Start: 2021-11-08 — End: 2021-11-09

## 2021-11-08 MED ORDER — FAMOTIDINE IN NACL 20-0.9 MG/50ML-% IV SOLN
20.0000 mg | Freq: Once | INTRAVENOUS | Status: AC
  Administered 2021-11-08: 20 mg via INTRAVENOUS
  Filled 2021-11-08: qty 50

## 2021-11-08 MED ORDER — ONDANSETRON HCL 4 MG/2ML IJ SOLN
4.0000 mg | Freq: Three times a day (TID) | INTRAMUSCULAR | Status: AC | PRN
  Administered 2021-11-08 – 2021-11-09 (×2): 4 mg via INTRAVENOUS
  Filled 2021-11-08 (×2): qty 2

## 2021-11-08 NOTE — Plan of Care (Signed)
  Problem: Education: Goal: Knowledge of General Education information will improve Description: Including pain rating scale, medication(s)/side effects and non-pharmacologic comfort measures Outcome: Progressing   Problem: Activity: Goal: Risk for activity intolerance will decrease Outcome: Progressing   

## 2021-11-08 NOTE — ED Provider Notes (Signed)
[facility] DEPT Provider Note   CSN: [id number] Arrival date & time: 11/08/21  1002     History {Add pertinent medical, surgical, social history, OB history to HPI:1} Chief Complaint  Patient presents with   Emesis   Weakness    Miguel [name] is a 61 y.o. male.  Patient as above with significant medical history as below, including DM, HLD, HTN, sleep apnea who presents to the ED with complaint of nausea, vomiting, abdominal pain, diarrhea.  Patient reports has been feeling unwell for approximately 1 week.  Initially was having diarrhea which has since improved.  Has not had a bowel movement approximately 4 to 5 days.  He has had profuse vomiting and unable to tolerate p.o. intake the past 2 to 3 days.  He will vomit immediately after eating or drinking.  No medications prior to arrival.  No history of prior abdominal surgery.  No history of similar symptoms in the past but no recent travel, sick contacts or suspicious p.o. intake.  No significant change with urination.  No BRBPR or melena.  Denies history of endoscopy.  No excessive alcohol use, no excessive NSAID use     Past Medical History:  Diagnosis Date   Anxiety    Arthritis    Depression    Diabetes mellitus without complication ([place])    Erectile dysfunction    Heart murmur    Hyperlipidemia    Hypertension    Myocardial infarction [facility]) 2003   Sleep apnea     Past Surgical History:  Procedure Laterality Date   BACK SURGERY     COLONOSCOPY     GASTROCNEMIUS RECESSION Left 06/17/2021   Procedure: Left gastroc recession;  Surgeon: Wylene Simmer, MD;  Location: [facility];  Service: Orthopedics;  Laterality: Left;   HAND SURGERY Right    LAMINECTOMY  07/2001   PLANTAR FASCIA RELEASE Left 06/17/2021   Procedure: Plantar fascia release;  Surgeon: Wylene Simmer, MD;  Location: [facility];  Service: Orthopedics;  Laterality: Left;   SHOULDER ARTHROSCOPY WITH  ROTATOR CUFF REPAIR AND SUBACROMIAL DECOMPRESSION Left 07/07/2017   Procedure: Left shoulder arthroscopy, subacromial decompression, mini open rotator cuff repair;  Surgeon: Susa Day, MD;  Location: [facility];  Service: Orthopedics;  Laterality: Left;     The history is provided by the patient. No language interpreter was used.  Emesis Associated symptoms: abdominal pain, diarrhea and fever   Associated symptoms: no chills, no cough and no headaches   Weakness Associated symptoms: abdominal pain, diarrhea, fever, nausea and vomiting   Associated symptoms: no chest pain, no cough, no headaches and no shortness of breath        Home Medications Prior to Admission medications   Medication Sig Start Date End Date Taking? Authorizing Provider  ASPIRIN 81 PO Take 1 tablet by mouth daily.    [provider]  budesonide-formoterol (SYMBICORT) 160-4.5 MCG/ACT inhaler Inhale 2 puffs into the lungs 2 (two) times daily.    [provider]  Dulaglutide (TRULICITY) 7.16 RC/7.8LF SOPN Inject 0.75 mg into the skin once a week.    [provider]  gabapentin (NEURONTIN) 300 MG capsule Take 1 capsule (300 mg total) by mouth 3 (three) times daily. 09/03/21   Marcial Pacas, MD  gabapentin (NEURONTIN) 600 MG tablet Take 1,800 mg by mouth at bedtime.    [provider]  Insulin Glargine (LANTUS SOLOSTAR) 100 UNIT/ML Solostar Pen Inject 30 Units into the skin 2 (two)  times daily.    [provider]  lisinopril (ZESTRIL) 5 MG tablet Take 5 mg by mouth daily.    [provider]  meloxicam (MOBIC) 7.5 MG tablet Take 7.5 mg by mouth as needed for pain.    [provider]  Oxycodone HCl 20 MG TABS Take 1 tablet by mouth 6 (six) times daily.    [provider]  rosuvastatin (CRESTOR) 5 MG tablet Take 5 mg by mouth 3 (three) times a week.    [provider]  sertraline (ZOLOFT) 100 MG tablet Take 100 mg by mouth at bedtime.    [provider]  tadalafil (CIALIS) 20 MG tablet Take 20 mg by mouth daily as needed for erectile dysfunction.    [provider]  tiZANidine (ZANAFLEX) 4 MG tablet Take 4 mg by mouth every 8 (eight) hours as needed for muscle spasms.    [provider]  traZODone (DESYREL) 50 MG tablet Take 50 mg by mouth at bedtime.    [provider]  zolpidem (AMBIEN) 10 MG tablet Take 10 mg by mouth at bedtime as needed for sleep.    [provider]      Allergies    Cymbalta [duloxetine hcl] and Statins    Review of Systems   Review of Systems  Constitutional:  Positive for appetite change and fever. Negative for chills.  HENT:  Negative for facial swelling and trouble swallowing.   Eyes:  Negative for photophobia and visual disturbance.  Respiratory:  Negative for cough and shortness of breath.   Cardiovascular:  Negative for chest pain and palpitations.  Gastrointestinal:  Positive for abdominal pain, diarrhea, nausea and vomiting.  Endocrine: Negative for polydipsia and polyuria.  Genitourinary:  Negative for difficulty urinating and hematuria.  Musculoskeletal:  Negative for gait problem and joint swelling.  Skin:  Negative for pallor and rash.  Neurological:  Negative for syncope and headaches.  Psychiatric/Behavioral:  Negative for agitation and confusion.     Physical Exam Updated Vital Signs BP 121/81 (BP Location: Left Arm)   Pulse 71   Temp 98.4 F (36.9 C) (Oral)   Resp 18   Ht 6' (1.829 m)   Wt 86.2 kg   SpO2 93%   BMI 25.77 kg/m  Physical Exam Vitals and nursing note reviewed.  Constitutional:      General: He is not in acute distress.    Appearance: Normal appearance. He is well-developed. He is not ill-appearing.  HENT:     Head: Normocephalic and atraumatic.     Right Ear: External ear normal.     Left Ear: External ear normal.     Mouth/Throat:     Mouth: Mucous membranes are dry.  Eyes:     General: No scleral  icterus. Cardiovascular:     Rate and Rhythm: Normal rate and regular rhythm.     Pulses: Normal pulses.     Heart sounds: Normal heart sounds.  Pulmonary:     Effort: Pulmonary effort is normal. No respiratory distress.     Breath sounds: Normal breath sounds.  Abdominal:     General: Abdomen is flat. There is no distension.     Palpations: Abdomen is soft.     Tenderness: There is generalized abdominal tenderness. There is no guarding or rebound.  Musculoskeletal:        General: Normal range of motion.     Cervical back: Normal range of motion.     Right lower leg: No  edema.     Left lower leg: No edema.  Skin:    General: Skin is warm and dry.     Capillary Refill: Capillary refill takes less than 2 seconds.  Neurological:     Mental Status: He is alert and oriented to person, place, and time.     GCS: GCS eye subscore is 4. GCS verbal subscore is 5. GCS motor subscore is 6.  Psychiatric:        Mood and Affect: Mood normal.        Behavior: Behavior normal.    ED Results / Procedures / Treatments   Labs (all labs ordered are listed, but only abnormal results are displayed) Labs Reviewed  COMPREHENSIVE METABOLIC PANEL - Abnormal; Notable for the following components:      Result Value   Chloride 95 (*)    Glucose, Bld 202 (*)    Total Protein 8.5 (*)    All other components within normal limits  CBC - Abnormal; Notable for the following components:   WBC 11.3 (*)    Platelets 437 (*)    All other components within normal limits  RESP PANEL BY RT-PCR (FLU A&B, COVID) ARPGX2  LIPASE, BLOOD  URINALYSIS, ROUTINE W REFLEX MICROSCOPIC  TROPONIN I (HIGH SENSITIVITY)  TROPONIN I (HIGH SENSITIVITY)    EKG EKG Interpretation  Date/Time:  Monday November 08 2021 10:48:39 EDT Ventricular Rate:  67 PR Interval:  165 QRS Duration: 97 QT Interval:  460 QTC Calculation: 486 R Axis:   59 Text Interpretation: Sinus rhythm Atrial premature complex Inferior infarct, old  Baseline wander in lead(s) V3 Since last tracing rate faster Otherwise no significant change Confirmed by Daleen Bo [phone]) on 11/08/2021 1:17:58 PM  Radiology No results found.  Procedures Procedures  {Document cardiac monitor, telemetry assessment procedure when appropriate:1}  Medications Ordered in ED Medications  ondansetron (ZOFRAN) injection 4 mg (has no administration in time range)  sodium chloride 0.9 % bolus 1,000 mL (has no administration in time range)  famotidine (PEPCID) IVPB 20 mg premix (has no administration in time range)  ondansetron (ZOFRAN-ODT) disintegrating tablet 4 mg (4 mg Oral Given 11/08/21 1027)    ED Course/ Medical Decision Making/ A&P                           Medical Decision Making Amount and/or Complexity of Data Reviewed Labs: ordered. Radiology: ordered.  Risk Prescription drug management.    CC: n/v/abd pain  This patient presents to the Emergency Department for the above complaint. This involves an extensive number of treatment options and is a complaint that carries with it a high risk of complications and morbidity. Vital signs were reviewed. Serious etiologies considered.  Differential diagnosis includes but is not exclusive to acute cholecystitis, intrathoracic causes for epigastric abdominal pain, gastritis, duodenitis, pancreatitis, small bowel or large bowel obstruction, abdominal aortic aneurysm, hernia, gastritis, etc.   Record review:  Previous records obtained and reviewed prior office visits, prior labs and imaging  Additional history obtained from family bedside  Medical and surgical history as noted above.   Work up as above, notable for:  Labs & imaging results that were available during my care of the patient were visualized by me and considered in my medical decision making.  Physical exam as above.   I ordered imaging studies which included CT abdomen pelvis IV contrast. I visualized the imaging, interpreted  images, and I agree with radiologist interpretation.  Small bowel obstruction  Cardiac monitoring reviewed and interpreted personally which shows NSR   Labs reviewed, glucose mildly elevated, no evidence of DKA.  CBC 11.3.  Afebrile.  Does not appear to be septic  Management: IV fluids, Pepcid, Zofran  ED Course:     Reassessment:  Mild improvement to his nausea.  Abdomen is soft, nonperitoneal on exam. No emesis, no significant distention of his abdomen, will hold off on NG tube at this time.  Admission was considered.   Will d/w gen surg             Social determinants of health include -  Social History   Socioeconomic History   Marital status: Married    Spouse name: Not on file   Number of children: Not on file   Years of education: Not on file   Highest education level: Not on file  Occupational History   Not on file  Tobacco Use   Smoking status: Former    Packs/day: 0.75    Types: Cigarettes   Smokeless tobacco: Never  Vaping Use   Vaping Use: Never used  Substance and Sexual Activity   Alcohol use: Not Currently    Comment: occasional   Drug use: Never   Sexual activity: Not on file  Other Topics Concern   Not on file  Social History Narrative   Right handed       On disability   Social Determinants of Health   Financial Resource Strain: Not on file  Food Insecurity: Not on file  Transportation Needs: Not on file  Physical Activity: Not on file  Stress: Not on file  Social Connections: Not on file  Intimate Partner Violence: Not on file      This chart was dictated using voice recognition software.  Despite best efforts to proofread,  errors can occur which can change the documentation meaning.   {Document critical care time when appropriate:1} {Document review of labs and clinical decision tools ie heart score, Chads2Vasc2 etc:1}  {Document your independent review of radiology images, and any outside records:1} {Document your  discussion with family members, caretakers, and with consultants:1} {Document social determinants of health affecting pt's care:1} {Document your decision making why or why not admission, treatments were needed:1} Final Clinical Impression(s) / ED Diagnoses Final diagnoses:  None    Rx / DC Orders ED Discharge Orders     None

## 2021-11-08 NOTE — ED Triage Notes (Signed)
Patient reports weakness, headache, and emesis 1 1/2 weeks.  Patient states when vomiting started he also had diarrhea, but does not have now.  Patient denies abdominal pain.

## 2021-11-08 NOTE — H&P (Signed)
History and Physical   M[name] TDV:[id number] DOB: 1961/03/05 DOA: 11/08/2021  PCP: Rory Percy, MD   Patient coming from: [name]  Chief Complaint: Abdominal pain, nausea, vomiting  HPI: [name] is a 61 y.o. male with medical history significant of COPD, status post lumbar fusion, neuropathy, chronic pain, degenerative disc disease, spinal stenosis, hypertension**presenting with nausea vomiting abdominal pain.  Patient states he had symptoms for about a week.  He initially was experiencing diarrhea but this is improved and transitioned to constipation; he has not had a bowel movement for the past 4 to 5 days.  The past 2 to 3 days, he has had significant nausea and vomiting with inability to tolerate any p.o. and will vomit immediately after drinking.  Denies any bloody or dark stools.  He does continue to pass flatus.  He denies fevers, chills, chest pain, shortness of breath.  ED Course: Vital signs in the ED stable.  Lab work-up included CMP with chloride 95, glucose 202, protein 8.5.  CBC with leukocytosis 11.1, platelets 437.  Troponin negative x2.  Lipase normal.  Rester panel for flu COVID pending.  Urinalysis pending.  CT of the abdomen pelvis shows small bowel obstruction with transition point in the mid jejunum.  No evidence of free air.  No focal formation.  Patient received Pepcid, Zofran, liter fluids in the ED.  General surgery consulted and will see the patient.  Review of Systems: As per HPI otherwise all other systems reviewed and are negative.  Past Medical History:  Diagnosis Date   Anxiety    Arthritis    Depression    Diabetes mellitus without complication ([name])    Erectile dysfunction    Heart murmur    Hyperlipidemia    Hypertension    Myocardial infarction [name]) 2003   PULMONARY NODULE 01/31/2007   Qualifier: Diagnosis of  By: June Leap     Sleep apnea     Past Surgical History:  Procedure Laterality Date   BACK SURGERY      COLONOSCOPY     GASTROCNEMIUS RECESSION Left 06/17/2021   Procedure: Left gastroc recession;  Surgeon: Wylene Simmer, MD;  Location: [name];  Service: Orthopedics;  Laterality: Left;   HAND SURGERY Right    LAMINECTOMY  07/2001   PLANTAR FASCIA RELEASE Left 06/17/2021   Procedure: Plantar fascia release;  Surgeon: Wylene Simmer, MD;  Location: [place];  Service: Orthopedics;  Laterality: Left;   SHOULDER ARTHROSCOPY WITH ROTATOR CUFF REPAIR AND SUBACROMIAL DECOMPRESSION Left 07/07/2017   Procedure: Left shoulder arthroscopy, subacromial decompression, mini open rotator cuff repair;  Surgeon: Susa Day, MD;  Location: [name];  Service: Orthopedics;  Laterality: Left;    Social History  reports that he has quit smoking. His smoking use included cigarettes. He smoked an average of .75 packs per day. He has never used smokeless tobacco. He reports that he does not currently use alcohol. He reports that he does not use drugs.  Allergies  Allergen Reactions   Cymbalta [Duloxetine Hcl] Other (See Comments)    Agitation    Statins Other (See Comments)    Extreme fatigue, myalgias (lipitor, zocor, crestor)   Zolpidem Other (See Comments)    Made the patient sleep walk    Family History  Problem Relation Age of Onset   AAA (abdominal aortic aneurysm) Father   Reviewed on admission  Prior to Admission medications   Medication Sig Start Date End Date Taking? Authorizing Provider  ALEVE 220 MG tablet Take 220-440 mg by mouth 2 (two) times daily as needed (for pain).   Yes [provider]  ASPIRIN 81 PO Take 81 mg by mouth See admin instructions. Take 81 mg by mouth every other night   Yes [provider]  Dulaglutide (TRULICITY) 3.29 JJ/8.8CZ SOPN Inject 0.75 mg into the skin every Tuesday.   Yes [provider]  gabapentin (NEURONTIN) 600 MG tablet Take 600 mg by mouth 2 (two) times daily.   Yes [provider]   Insulin Glargine (LANTUS SOLOSTAR) 100 UNIT/ML Solostar Pen Inject 30 Units into the skin at bedtime.   Yes [provider]  lisinopril (ZESTRIL) 5 MG tablet Take 5 mg by mouth daily.   Yes [provider]  Oxycodone HCl 20 MG TABS Take 20 mg by mouth 6 (six) times daily.   Yes [provider]  sertraline (ZOLOFT) 100 MG tablet Take 100 mg by mouth at bedtime.   Yes [provider]  tadalafil (CIALIS) 20 MG tablet Take 20 mg by mouth daily as needed for erectile dysfunction.   Yes [provider]  tiZANidine (ZANAFLEX) 4 MG tablet Take 4 mg by mouth every 8 (eight) hours as needed for muscle spasms.   Yes [provider]  traZODone (DESYREL) 50 MG tablet Take 50 mg by mouth at bedtime.   Yes [provider]  gabapentin (NEURONTIN) 300 MG capsule Take 1 capsule (300 mg total) by mouth 3 (three) times daily. Patient not taking: Reported on 11/08/2021 09/03/21   Marcial Pacas, MD    Physical Exam: Vitals:   11/08/21 1729 11/08/21 1800 11/08/21 1830 11/08/21 1930  BP:  (!) 142/69 124/76 122/73  Pulse:  70 68 66  Resp:  '12 15 15  '$ Temp: 99.5 F (37.5 C)     TempSrc: Oral     SpO2:  99% 97% 96%  Weight:      Height:        Physical Exam Constitutional:      General: He is not in acute distress.    Appearance: Normal appearance.  HENT:     Head: Normocephalic and atraumatic.     Mouth/Throat:     Mouth: Mucous membranes are moist.     Pharynx: Oropharynx is clear.  Eyes:     Extraocular Movements: Extraocular movements intact.     Pupils: Pupils are equal, round, and reactive to light.  Cardiovascular:     Rate and Rhythm: Normal rate and regular rhythm.     Pulses: Normal pulses.     Heart sounds: Normal heart sounds.  Pulmonary:     Effort: Pulmonary effort is normal. No respiratory distress.     Breath sounds: Normal breath sounds.  Abdominal:     General: Bowel sounds are normal. There is no distension.      Palpations: Abdomen is soft.     Tenderness: There is abdominal tenderness.  Musculoskeletal:        General: No swelling or deformity.  Skin:    General: Skin is warm and dry.  Neurological:     General: No focal deficit present.     Mental Status: Mental status is at baseline.    Labs on Admission: I have personally reviewed following labs and imaging studies  CBC: Recent Labs  Lab 11/08/21 1051  WBC 11.3*  HGB 14.2  HCT 42.7  MCV 86.6  PLT 437*    Basic Metabolic Panel: Recent Labs  Lab 11/08/21  1051  NA 138  K 4.0  CL 95*  CO2 28  GLUCOSE 202*  BUN 23  CREATININE 0.99  CALCIUM 9.4    GFR: Estimated Creatinine Clearance: 84.9 mL/min (by C-G formula based on SCr of 0.99 mg/dL).  Liver Function Tests: Recent Labs  Lab 11/08/21 1051  AST 20  ALT 20  ALKPHOS 94  BILITOT 0.8  PROT 8.5*  ALBUMIN 3.7    Urine analysis: No results found for: "COLORURINE", "APPEARANCEUR", "LABSPEC", "PHURINE", "GLUCOSEU", "HGBUR", "BILIRUBINUR", "KETONESUR", "PROTEINUR", "UROBILINOGEN", "NITRITE", "LEUKOCYTESUR"  Radiological Exams on Admission: CT ABDOMEN PELVIS W CONTRAST  Result Date: 11/08/2021 CLINICAL DATA:  Nausea and vomiting, evaluate for small bowel obstruction. EXAM: CT ABDOMEN AND PELVIS WITH CONTRAST TECHNIQUE: Multidetector CT imaging of the abdomen and pelvis was performed using the standard protocol following bolus administration of intravenous contrast. RADIATION DOSE REDUCTION: This exam was performed according to the departmental dose-optimization program which includes automated exposure control, adjustment of the mA and/or kV according to patient size and/or use of iterative reconstruction technique. CONTRAST:  172m OMNIPAQUE IOHEXOL 300 MG/ML  SOLN COMPARISON:  CT abdomen and pelvis 02/19/2003. MRI abdomen 02/12/2005. FINDINGS: Lower chest: No acute abnormality. Hepatobiliary: There is diffuse fatty infiltration of the liver. Gallbladder and bile ducts are  within normal limits. Pancreas: Unremarkable. No pancreatic ductal dilatation or surrounding inflammatory changes. Spleen: Normal in size without focal abnormality. Adrenals/Urinary Tract: Subcentimeter cysts are identified in the right kidney. No follow-up imaging recommended. Otherwise, the kidneys, adrenal glands, and bladder are within normal limits. Stomach/Bowel: Stomach is moderately distended with air-fluid level. Jejunal loops are dilated with air-fluid levels in the left upper quadrant measuring up to 3.3 cm. Duodenum is also dilated with air-fluid level. Transition to normal caliber is seen in the left upper quadrant and mid and distal small-bowel loops are decompressed. No focal wall thickening or inflammation identified. Colon and appendix are within normal limits. Vascular/Lymphatic: Aortic atherosclerosis. No enlarged abdominal or pelvic lymph nodes. Reproductive: Prostate is unremarkable. Other: No abdominal wall hernia or abnormality. No abdominopelvic ascites. Musculoskeletal: L3-L5 posterior fusion hardware is present. IMPRESSION: 1. Small-bowel obstruction with transition point within mid jejunum in the left upper quadrant. No free air. No focal inflammation. 2. Fatty infiltration of the liver. Electronically Signed   By: ARonney AstersM.D.   On: 11/08/2021 19:22    EKG: Independently reviewed.  Sinus rhythm at 67 bpm.  PAC noted.  Nonspecific T wave flattening.  Assessment/Plan Principal Problem:   SBO (small bowel obstruction) (HCC) Active Problems:   COPD (chronic obstructive pulmonary disease) (HCC)   S/P lumbar fusion   Neuropathy of left superficial peroneal nerve   Chronic pain syndrome   Degeneration of lumbar intervertebral disc   Spinal stenosis of lumbar region   Diabetes (HCC)  SBO > Patient presenting with nausea vomiting abdominal pain that was preceded by diarrhea and transition to constipation. > No bowel movement for 4 to 5 days unable to tolerate p.o. and  having significant nausea vomiting for the past 2 to 3 days. > CT showed small bowel obstruction with transition point in the mid jejunum, no free air, no focal inflammation. > Patient still passing flatus at this time. > General surgery consulted in the ED. - Proceed general surgery recommendations - Monitor on telemetry - N.p.o. - NG tube if indicated by general surgery - Pain control as needed - Maintenance fluids - Anticipate SBO possible imaging protocol   Chronic pain Degenerative disc disease Spinal stenosis Neuropathy  Status post lumbar fusion - Holding [name] oxycodone, gabapentin - As needed pain control with Dilaudid for moderate to severe pain  Diabetes > On lisinopril for renal protection, holding this while NPO. - SSI while NPO  COPD > History of those listed in chart however no active maintenance inhaler listed. - As needed albuterol  ?Anxiety > Patient prescribed sertraline and trazodone but is unclear what the diagnosis is - We will hold these while n.p.o.  DVT prophylaxis: SCDs for now Code Status:   Full Family Communication:  Updated at bedside Disposition Plan:   Patient is from:  [name]  Anticipated DC to:  [name]  Anticipated DC date:  1 to 3 days  Anticipated DC barriers: None  Consults called:  General surgery, consulted in the ED, will see the patient Admission status:  Observation, telemetry  Severity of Illness: The appropriate patient status for this patient is OBSERVATION. Observation status is judged to be reasonable and necessary in order to provide the required intensity of service to ensure the patient's safety. The patient's presenting symptoms, physical exam findings, and initial radiographic and laboratory data in the context of their medical condition is felt to place them at decreased risk for further clinical deterioration. Furthermore, it is anticipated that the patient will be medically stable for discharge from the hospital within 2  midnights of admission.    Marcelyn Bruins MD [facility]  How to contact the [facility] Attending or Consulting provider [place] or covering provider during after hours [place], for this patient?   Check the care team in [name] and look for a) attending/consulting [facility] provider listed and b) the [name] team listed Log into [email or web address] and use [place]'s universal password to access. If you do not have the password, please contact the hospital operator. Locate the [name] provider you are looking for under [facility] and page to a number that you can be directly reached. If you still have difficulty reaching the provider, please page the [name] (Director on Call) for the Hospitalists listed on amion for assistance.  11/08/2021, 8:33 PM

## 2021-11-08 NOTE — ED Notes (Signed)
Save blue in main lab 

## 2021-11-08 NOTE — Consult Note (Signed)
Reason for Consult:SBO Referring Physician: Dr. Trilby Drummer, [facility]  M[name] is an 61 y.o. male.  HPI: This is a 61 year old male with COPD, chronic pain, degenerative disc disease, hypertension status post multiple spine surgeries who presents with a 1 month history of intermittent nausea, vomiting, and diarrhea.  Over the last several days, he has not been able to have any bowel movements and has become progressively more nauseated.  For the last 2 or 3 days he has had copious nausea and vomiting.  He does report continued to pass flatus, even while in the emergency department.  CT scan showed a small bowel obstruction so we were consulted.  The patient has had no previous intra-abdominal surgery.  He has had no nausea or vomiting in the last 12 hours.  Past Medical History:  Diagnosis Date   Anxiety    Arthritis    Depression    Diabetes mellitus without complication ([place])    Erectile dysfunction    Heart murmur    Hyperlipidemia    Hypertension    Myocardial infarction [facility]) 2003   PULMONARY NODULE 01/31/2007   Qualifier: Diagnosis of  By: June Leap     Sleep apnea     Past Surgical History:  Procedure Laterality Date   BACK SURGERY     COLONOSCOPY     GASTROCNEMIUS RECESSION Left 06/17/2021   Procedure: Left gastroc recession;  Surgeon: Wylene Simmer, MD;  Location: [place];  Service: Orthopedics;  Laterality: Left;   HAND SURGERY Right    LAMINECTOMY  07/2001   PLANTAR FASCIA RELEASE Left 06/17/2021   Procedure: Plantar fascia release;  Surgeon: Wylene Simmer, MD;  Location: [place];  Service: Orthopedics;  Laterality: Left;   SHOULDER ARTHROSCOPY WITH ROTATOR CUFF REPAIR AND SUBACROMIAL DECOMPRESSION Left 07/07/2017   Procedure: Left shoulder arthroscopy, subacromial decompression, mini open rotator cuff repair;  Surgeon: Susa Day, MD;  Location: [place];  Service: Orthopedics;  Laterality: Left;    Family History  Problem  Relation Age of Onset   AAA (abdominal aortic aneurysm) Father     Social History:  reports that he has quit smoking. His smoking use included cigarettes. He smoked an average of .75 packs per day. He has never used smokeless tobacco. He reports that he does not currently use alcohol. He reports that he does not use drugs.  Allergies:  Allergies  Allergen Reactions   Cymbalta [Duloxetine Hcl] Other (See Comments)    Agitation    Statins Other (See Comments)    Extreme fatigue, myalgias (lipitor, zocor, crestor)   Zolpidem Other (See Comments)    Made the patient sleep walk    Medications:  Prior to Admission medications   Medication Sig Start Date End Date Taking? Authorizing Provider  ALEVE 220 MG tablet Take 220-440 mg by mouth 2 (two) times daily as needed (for pain).   Yes [provider]  ASPIRIN 81 PO Take 81 mg by mouth See admin instructions. Take 81 mg by mouth every other night   Yes [provider]  Dulaglutide (TRULICITY) 5.10 CH/8.5ID SOPN Inject 0.75 mg into the skin every Tuesday.   Yes [provider]  gabapentin (NEURONTIN) 600 MG tablet Take 600 mg by mouth 2 (two) times daily.   Yes [provider]  Insulin Glargine (LANTUS SOLOSTAR) 100 UNIT/ML Solostar Pen Inject 30 Units into the skin at bedtime.   Yes [provider]  lisinopril (ZESTRIL) 5 MG tablet Take 5 mg  by mouth daily.   Yes [provider]  Oxycodone HCl 20 MG TABS Take 20 mg by mouth 6 (six) times daily.   Yes [provider]  sertraline (ZOLOFT) 100 MG tablet Take 100 mg by mouth at bedtime.   Yes [provider]  tadalafil (CIALIS) 20 MG tablet Take 20 mg by mouth daily as needed for erectile dysfunction.   Yes [provider]  tiZANidine (ZANAFLEX) 4 MG tablet Take 4 mg by mouth every 8 (eight) hours as needed for muscle spasms.   Yes [provider]  traZODone (DESYREL) 50 MG tablet Take 50 mg by mouth at  bedtime.   Yes [provider]     Results for orders placed or performed during the hospital encounter of 11/08/21 (from the past 48 hour(s))  Lipase, blood     Status: None   Collection Time: 11/08/21 10:51 AM  Result Value Ref Range   Lipase 47 11 - 51 U/L    Comment: Performed at [facility], [place] [address] [place], [place] [address]  Comprehensive metabolic panel     Status: Abnormal   Collection Time: 11/08/21 10:51 AM  Result Value Ref Range   Sodium 138 135 - 145 mmol/L   Potassium 4.0 3.5 - 5.1 mmol/L   Chloride 95 (L) 98 - 111 mmol/L   CO2 28 22 - 32 mmol/L   Glucose, Bld 202 (H) 70 - 99 mg/dL    Comment: Glucose reference range applies only to samples taken after fasting for at least 8 hours.   BUN 23 8 - 23 mg/dL   Creatinine, Ser 0.99 0.61 - 1.24 mg/dL   Calcium 9.4 8.9 - 10.3 mg/dL   Total Protein 8.5 (H) 6.5 - 8.1 g/dL   Albumin 3.7 3.5 - 5.0 g/dL   AST 20 15 - 41 U/L   ALT 20 0 - 44 U/L   Alkaline Phosphatase 94 38 - 126 U/L   Total Bilirubin 0.8 0.3 - 1.2 mg/dL   GFR, Estimated >60 >60 mL/min    Comment: (NOTE) Calculated using the CKD-EPI Creatinine Equation (2021)    Anion gap 15 5 - 15    Comment: Performed at [facility], [place] [address] [place],  [address]  CBC     Status: Abnormal   Collection Time: 11/08/21 10:51 AM  Result Value Ref Range   WBC 11.3 (H) 4.0 - 10.5 K/uL   RBC 4.93 4.22 - 5.81 MIL/uL   Hemoglobin 14.2 13.0 - 17.0 g/dL   HCT 42.7 39.0 - 52.0 %   MCV 86.6 80.0 - 100.0 fL   MCH 28.8 26.0 - 34.0 pg   MCHC 33.3 30.0 - 36.0 g/dL   RDW 13.5 11.5 - 15.5 %   Platelets 437 (H) 150 - 400 K/uL   nRBC 0.0 0.0 - 0.2 %    Comment: Performed at [facility], [place] [address] [place], [place] [address]  Troponin I (High Sensitivity)     Status: None   Collection Time: 11/08/21 10:51 AM  Result Value Ref Range   Troponin I (High Sensitivity) 11 <18 ng/L    Comment:  (NOTE) Elevated high sensitivity troponin I (hsTnI) values and significant  changes across serial measurements may suggest ACS but many other  chronic and acute conditions are known to elevate hsTnI results.  Refer to the "Links" section for chest pain algorithms and additional  guidance. Performed at [facility], [address] [place], [place] [address]   Troponin I (High Sensitivity)     Status: None   Collection Time: 11/08/21 12:40 PM  Result Value Ref Range   Troponin I (High Sensitivity) 13 <18 ng/L    Comment: (NOTE) Elevated high sensitivity troponin I (hsTnI) values and significant  changes across serial measurements may suggest ACS but many other  chronic and acute conditions are known to elevate hsTnI results.  Refer to the "Links" section for chest pain algorithms and additional  guidance. Performed at [facility], [place] [address] [place], [place] [address]     CT ABDOMEN PELVIS W CONTRAST  Result Date: 11/08/2021 CLINICAL DATA:  Nausea and vomiting, evaluate for small bowel obstruction. EXAM: CT ABDOMEN AND PELVIS WITH CONTRAST TECHNIQUE: Multidetector CT imaging of the abdomen and pelvis was performed using the standard protocol following bolus administration of intravenous contrast. RADIATION DOSE REDUCTION: This exam was performed according to the departmental dose-optimization program which includes automated exposure control, adjustment of the mA and/or kV according to patient size and/or use of iterative reconstruction technique. CONTRAST:  173m OMNIPAQUE IOHEXOL 300 MG/ML  SOLN COMPARISON:  CT abdomen and pelvis 02/19/2003. MRI abdomen 02/12/2005. FINDINGS: Lower chest: No acute abnormality. Hepatobiliary: There is diffuse fatty infiltration of the liver. Gallbladder and bile ducts are within normal limits. Pancreas: Unremarkable. No pancreatic ductal dilatation or surrounding inflammatory changes. Spleen: Normal in size without  focal abnormality. Adrenals/Urinary Tract: Subcentimeter cysts are identified in the right kidney. No follow-up imaging recommended. Otherwise, the kidneys, adrenal glands, and bladder are within normal limits. Stomach/Bowel: Stomach is moderately distended with air-fluid level. Jejunal loops are dilated with air-fluid levels in the left upper quadrant measuring up to 3.3 cm. Duodenum is also dilated with air-fluid level. Transition to normal caliber is seen in the left upper quadrant and mid and distal small-bowel loops are decompressed. No focal wall thickening or inflammation identified. Colon and appendix are within normal limits. Vascular/Lymphatic: Aortic atherosclerosis. No enlarged abdominal or pelvic lymph nodes. Reproductive: Prostate is unremarkable. Other: No abdominal wall hernia or abnormality. No abdominopelvic ascites. Musculoskeletal: L3-L5 posterior fusion hardware is present. IMPRESSION: 1. Small-bowel obstruction with transition point within mid jejunum in the left upper quadrant. No free air. No focal inflammation. 2. Fatty infiltration of the liver. Electronically Signed   By: ARonney AstersM.D.   On: 11/08/2021 19:22    ROS- positive for nausea, vomiting, abdominal distention, abdominal pain  Otherwise negative  Blood pressure 122/73, pulse 66, temperature 99.5 F (37.5 C), temperature source Oral, resp. rate 15, height 6' (1.829 m), weight 86.2 kg, SpO2 96 %. Physical Exam Constitutional:  WDWN in NAD, conversant, no obvious deformities; lying in bed comfortably Eyes:  Pupils equal, round; sclera anicteric; moist conjunctiva; no lid lag HENT:  Oral mucosa moist; good dentition  Neck:  No masses palpated, trachea midline; no thyromegaly Lungs:  CTA bilaterally; normal respiratory effort CV:  Regular rate and rhythm; no murmurs; extremities well-perfused with no edema Abd:  +bowel sounds, moderately distended; mild diffuse tenderness without peritonitis Musc:  Unable to assess  gait; no apparent clubbing or cyanosis in extremities Lymphatic:  No palpable cervical or axillary lymphadenopathy Skin:  Warm, dry; no sign of jaundice Psychiatric - alert and oriented x 4; calm mood and affect  Assessment/Plan: Partial small bowel obstruction - no previous surgery, recent gastrointestinal issues with nausea, vomiting, diarrhea  Recommend IV hydration, bowel rest N.p.o. except for ice chips  Since patient has  not had any nausea or vomiting in the last 12 hours, and since he is passing flatus regularly, would not recommend NG tube at this time.  However, I explained to him that if he begins vomiting again, we would place a nasogastric tube to completely decompress his stomach and proximal small bowel.  We will follow-up tomorrow.  [name] 11/08/2021, 8:47 PM

## 2021-11-08 NOTE — ED Notes (Signed)
Talked to nurse on floor to inform her that SBAR was sent 10 min ago

## 2021-11-08 NOTE — ED Provider Triage Note (Signed)
Emergency Medicine Provider Triage Evaluation Note  Miguel [name] , a 61 y.o. male  was evaluated in triage.  Pt complains of nausea and vomiting x 1 week. Intractable. Bad headache. Unable to hold anything down. No hx of abd surgeries  Review of Systems  Positive: vomiting Negative: fever  Physical Exam  BP 113/84 (BP Location: Left Arm)   Pulse 72   Temp 98.4 F (36.9 C) (Oral)   Resp 16   Ht 6' (1.829 m)   Wt 86.2 kg   SpO2 99%   BMI 25.77 kg/m  Gen:   Awake, no distress   Resp:  Normal effort  MSK:   Moves extremities without difficulty  Other:  No abd tenderness  Medical Decision Making  Medically screening exam initiated at 10:39 AM.  Appropriate orders placed.  [name] was informed that the remainder of the evaluation will be completed by another provider, this initial triage assessment does not replace that evaluation, and the importance of remaining in the ED until their evaluation is complete.  Work up initiated   [employer], PA-C 11/08/21 1042

## 2021-11-09 ENCOUNTER — Inpatient Hospital Stay (HOSPITAL_COMMUNITY): Payer: 59

## 2021-11-09 DIAGNOSIS — Z87891 Personal history of nicotine dependence: Secondary | ICD-10-CM | POA: Diagnosis not present

## 2021-11-09 DIAGNOSIS — D75839 Thrombocytosis, unspecified: Secondary | ICD-10-CM | POA: Diagnosis present

## 2021-11-09 DIAGNOSIS — Z888 Allergy status to other drugs, medicaments and biological substances status: Secondary | ICD-10-CM | POA: Diagnosis not present

## 2021-11-09 DIAGNOSIS — Z981 Arthrodesis status: Secondary | ICD-10-CM | POA: Diagnosis not present

## 2021-11-09 DIAGNOSIS — Z794 Long term (current) use of insulin: Secondary | ICD-10-CM | POA: Diagnosis not present

## 2021-11-09 DIAGNOSIS — J449 Chronic obstructive pulmonary disease, unspecified: Secondary | ICD-10-CM | POA: Diagnosis present

## 2021-11-09 DIAGNOSIS — I10 Essential (primary) hypertension: Secondary | ICD-10-CM | POA: Diagnosis present

## 2021-11-09 DIAGNOSIS — R011 Cardiac murmur, unspecified: Secondary | ICD-10-CM | POA: Diagnosis present

## 2021-11-09 DIAGNOSIS — D72829 Elevated white blood cell count, unspecified: Secondary | ICD-10-CM

## 2021-11-09 DIAGNOSIS — E785 Hyperlipidemia, unspecified: Secondary | ICD-10-CM | POA: Diagnosis present

## 2021-11-09 DIAGNOSIS — K56600 Partial intestinal obstruction, unspecified as to cause: Secondary | ICD-10-CM | POA: Diagnosis present

## 2021-11-09 DIAGNOSIS — M48061 Spinal stenosis, lumbar region without neurogenic claudication: Secondary | ICD-10-CM | POA: Diagnosis present

## 2021-11-09 DIAGNOSIS — E1142 Type 2 diabetes mellitus with diabetic polyneuropathy: Secondary | ICD-10-CM | POA: Diagnosis present

## 2021-11-09 DIAGNOSIS — M5136 Other intervertebral disc degeneration, lumbar region: Secondary | ICD-10-CM | POA: Diagnosis present

## 2021-11-09 DIAGNOSIS — Z7982 Long term (current) use of aspirin: Secondary | ICD-10-CM | POA: Diagnosis not present

## 2021-11-09 DIAGNOSIS — F32A Depression, unspecified: Secondary | ICD-10-CM | POA: Diagnosis present

## 2021-11-09 DIAGNOSIS — Z79899 Other long term (current) drug therapy: Secondary | ICD-10-CM | POA: Diagnosis not present

## 2021-11-09 DIAGNOSIS — Z20822 Contact with and (suspected) exposure to covid-19: Secondary | ICD-10-CM | POA: Diagnosis present

## 2021-11-09 DIAGNOSIS — I252 Old myocardial infarction: Secondary | ICD-10-CM | POA: Diagnosis not present

## 2021-11-09 DIAGNOSIS — G894 Chronic pain syndrome: Secondary | ICD-10-CM | POA: Diagnosis present

## 2021-11-09 DIAGNOSIS — K56609 Unspecified intestinal obstruction, unspecified as to partial versus complete obstruction: Secondary | ICD-10-CM | POA: Diagnosis present

## 2021-11-09 DIAGNOSIS — F419 Anxiety disorder, unspecified: Secondary | ICD-10-CM | POA: Diagnosis present

## 2021-11-09 LAB — COMPREHENSIVE METABOLIC PANEL
ALT: 18 U/L (ref 0–44)
AST: 18 U/L (ref 15–41)
Albumin: 3.2 g/dL — ABNORMAL LOW (ref 3.5–5.0)
Alkaline Phosphatase: 73 U/L (ref 38–126)
Anion gap: 11 (ref 5–15)
BUN: 22 mg/dL (ref 8–23)
CO2: 26 mmol/L (ref 22–32)
Calcium: 8.7 mg/dL — ABNORMAL LOW (ref 8.9–10.3)
Chloride: 99 mmol/L (ref 98–111)
Creatinine, Ser: 0.98 mg/dL (ref 0.61–1.24)
GFR, Estimated: >60 mL/min (ref >60)
Glucose, Bld: 116 mg/dL — ABNORMAL HIGH (ref 70–99)
Potassium: 3.5 mmol/L (ref 3.5–5.1)
Sodium: 136 mmol/L (ref 135–145)
Total Bilirubin: 0.9 mg/dL (ref 0.3–1.2)
Total Protein: 7.1 g/dL (ref 6.5–8.1)

## 2021-11-09 LAB — CBC
HCT: 36.9 % — ABNORMAL LOW (ref 39.0–52.0)
Hemoglobin: 12.2 g/dL — ABNORMAL LOW (ref 13.0–17.0)
MCH: 28.6 pg (ref 26.0–34.0)
MCHC: 33.1 g/dL (ref 30.0–36.0)
MCV: 86.4 fL (ref 80.0–100.0)
Platelets: 384 10*3/uL (ref 150–400)
RBC: 4.27 MIL/uL (ref 4.22–5.81)
RDW: 13.4 % (ref 11.5–15.5)
WBC: 10.5 10*3/uL (ref 4.0–10.5)
nRBC: 0 % (ref 0.0–0.2)

## 2021-11-09 LAB — HIV ANTIBODY (ROUTINE TESTING W REFLEX): HIV Screen 4th Generation wRfx: NONREACTIVE

## 2021-11-09 LAB — RESP PANEL BY RT-PCR (FLU A&B, COVID) ARPGX2
Influenza A by PCR: NEGATIVE
Influenza B by PCR: NEGATIVE
SARS Coronavirus 2 by RT PCR: NEGATIVE

## 2021-11-09 LAB — GLUCOSE, CAPILLARY
Glucose-Capillary: 116 mg/dL — ABNORMAL HIGH (ref 70–99)
Glucose-Capillary: 118 mg/dL — ABNORMAL HIGH (ref 70–99)
Glucose-Capillary: 123 mg/dL — ABNORMAL HIGH (ref 70–99)
Glucose-Capillary: 125 mg/dL — ABNORMAL HIGH (ref 70–99)
Glucose-Capillary: 141 mg/dL — ABNORMAL HIGH (ref 70–99)
Glucose-Capillary: 158 mg/dL — ABNORMAL HIGH (ref 70–99)
Glucose-Capillary: 187 mg/dL — ABNORMAL HIGH (ref 70–99)

## 2021-11-09 MED ORDER — SODIUM CHLORIDE 0.9 % IV SOLN: INTRAVENOUS | Status: DC

## 2021-11-09 MED ORDER — SODIUM CHLORIDE 0.9 % IV SOLN: INTRAVENOUS | Status: AC

## 2021-11-09 MED ORDER — ENOXAPARIN SODIUM 40 MG/0.4ML IJ SOSY
40.0000 mg | PREFILLED_SYRINGE | Freq: Every day | INTRAMUSCULAR | Status: DC
Start: 2021-11-09 — End: 2021-11-11
  Administered 2021-11-09 – 2021-11-10 (×2): 40 mg via SUBCUTANEOUS
  Filled 2021-11-09 (×2): qty 0.4

## 2021-11-09 MED ORDER — DIATRIZOATE MEGLUMINE & SODIUM 66-10 % PO SOLN
90.0000 mL | Freq: Once | ORAL | Status: AC
  Administered 2021-11-09: 90 mL via ORAL
  Filled 2021-11-09: qty 90

## 2021-11-09 NOTE — Progress Notes (Signed)
Subjective: Feeling a little better today.  Small BM just after I left.  Still passing flatus.  Some nausea, but no emesis since yesterday morning at 9am.  Minimal abdominal soreness.  Low grade temp of 100.4 overnight   Objective: Vital signs in last 24 hours: Temp:  [98.4 F (36.9 C)-100.4 F (38 C)] 98.4 F (36.9 C) (08/01 0557) Pulse Rate:  [59-75] 59 (08/01 0557) Resp:  [12-18] 18 (08/01 0557) BP: (113-144)/(62-84) 114/66 (08/01 0557) SpO2:  [92 %-99 %] 92 % (08/01 0557) Weight:  [86.2 kg] 86.2 kg (07/31 1022) Last BM Date : 10/29/21  Intake/Output from previous day: 07/31 0701 - 08/01 0700 In: 575 [I.V.:525; IV Piggyback:50] Out: 500 [Urine:500] Intake/Output this shift: No intake/output data recorded.  PE: Abd: soft, minimally tender in upper abdomen, +BS, ND  Lab Results:  Recent Labs    11/08/21 1051 11/09/21 0355  WBC 11.3* 10.5  HGB 14.2 12.2*  HCT 42.7 36.9*  PLT 437* 384   BMET Recent Labs    11/08/21 1051 11/09/21 0355  NA 138 136  K 4.0 3.5  CL 95* 99  CO2 28 26  GLUCOSE 202* 116*  BUN 23 22  CREATININE 0.99 0.98  CALCIUM 9.4 8.7*   PT/INR No results for input(s): "LABPROT", "INR" in the last 72 hours. CMP     Component Value Date/Time   NA 136 11/09/2021 0355   K 3.5 11/09/2021 0355   CL 99 11/09/2021 0355   CO2 26 11/09/2021 0355   GLUCOSE 116 (H) 11/09/2021 0355   BUN 22 11/09/2021 0355   CREATININE 0.98 11/09/2021 0355   CALCIUM 8.7 (L) 11/09/2021 0355   PROT 7.1 11/09/2021 0355   ALBUMIN 3.2 (L) 11/09/2021 0355   AST 18 11/09/2021 0355   ALT 18 11/09/2021 0355   ALKPHOS 73 11/09/2021 0355   BILITOT 0.9 11/09/2021 0355   GFRNONAA >60 11/09/2021 0355   GFRAA >60 04/08/2019 0850   Lipase     Component Value Date/Time   LIPASE 47 11/08/2021 1051       Studies/Results: CT ABDOMEN PELVIS W CONTRAST  Result Date: 11/08/2021 CLINICAL DATA:  Nausea and vomiting, evaluate for small bowel obstruction. EXAM: CT  ABDOMEN AND PELVIS WITH CONTRAST TECHNIQUE: Multidetector CT imaging of the abdomen and pelvis was performed using the standard protocol following bolus administration of intravenous contrast. RADIATION DOSE REDUCTION: This exam was performed according to the departmental dose-optimization program which includes automated exposure control, adjustment of the mA and/or kV according to patient size and/or use of iterative reconstruction technique. CONTRAST:  169m OMNIPAQUE IOHEXOL 300 MG/ML  SOLN COMPARISON:  CT abdomen and pelvis 02/19/2003. MRI abdomen 02/12/2005. FINDINGS: Lower chest: No acute abnormality. Hepatobiliary: There is diffuse fatty infiltration of the liver. Gallbladder and bile ducts are within normal limits. Pancreas: Unremarkable. No pancreatic ductal dilatation or surrounding inflammatory changes. Spleen: Normal in size without focal abnormality. Adrenals/Urinary Tract: Subcentimeter cysts are identified in the right kidney. No follow-up imaging recommended. Otherwise, the kidneys, adrenal glands, and bladder are within normal limits. Stomach/Bowel: Stomach is moderately distended with air-fluid level. Jejunal loops are dilated with air-fluid levels in the left upper quadrant measuring up to 3.3 cm. Duodenum is also dilated with air-fluid level. Transition to normal caliber is seen in the left upper quadrant and mid and distal small-bowel loops are decompressed. No focal wall thickening or inflammation identified. Colon and appendix are within normal limits. Vascular/Lymphatic: Aortic atherosclerosis. No enlarged abdominal or  pelvic lymph nodes. Reproductive: Prostate is unremarkable. Other: No abdominal wall hernia or abnormality. No abdominopelvic ascites. Musculoskeletal: L3-L5 posterior fusion hardware is present. IMPRESSION: 1. Small-bowel obstruction with transition point within mid jejunum in the left upper quadrant. No free air. No focal inflammation. 2. Fatty infiltration of the liver.  Electronically Signed   By: Ronney Asters M.D.   On: 11/08/2021 19:22    Anti-infectives: Anti-infectives (From admission, onward)    None        Assessment/Plan pSBO -seems to be overall improving, but still with some nausea -will try some clear liquids from the floor today to see if he is able to tolerate this prior to further advancement since he is still having some nausea. -mobilize as able -keep K >4 and Mag >2. -cont to follow -unsure if patient had an enteritis causing initial N/V/D.  Diarrhea has stopped about 5-7 days ago.  Not sure if low grade temp is secondary to a viral process.  COVID test pending per primary    FEN - NPO x sips of clears, IVFs VTE - may have chemical prophylaxis from our standpoint ID - none needed  I reviewed hospitalist notes, last 24 h vitals and pain scores, last 48 h intake and output, last 24 h labs and trends, and last 24 h imaging results.   LOS: 0 days    Henreitta Cea , [facility] 11/09/2021, 10:12 AM Please see Amion for pager number during day hours 7:00am-4:30pm or 7:00am -11:30am on weekends

## 2021-11-09 NOTE — Progress Notes (Signed)
Patient called for pain medication and sleeping medication, after giving pain medication pt began feeling sick and had an episode of n/v with 22m of emesis.   Gave Zofran PRN. Will continue to monitor.   Patient also c/o frequent BM and watery diarrhea, he presumes from the medication that is said to "clean him out".

## 2021-11-09 NOTE — Progress Notes (Signed)
Mobility Specialist - Progress Note    11/09/21 1040  Mobility  Activity Ambulated independently in hallway  Level of Assistance Independent  Assistive Device None  Distance Ambulated (ft) 370 ft  Activity Response Tolerated well  $Mobility charge 1 Mobility   Pt was agreeable to mobilize. Pt got out of bed independently. Pt stated being able to move independently. Pt was able to ambulate 332f in hallway independently. Pt returned to bed after mobilizing. Pt requested ice chips. Upon verifying with RN, Pt was able to receive ice chips and clear liquids. Pt was left in room with all necessities in reach, with family in room, and with RN in room.  BFerd HibbsMobility Specialist

## 2021-11-09 NOTE — Progress Notes (Signed)
PROGRESS NOTE    Miguel [name]  RWE:[id number] DOB: 08-07-1960 DOA: 11/08/2021 PCP: Rory Percy, MD   Brief Narrative:   61 y.o. male with medical history significant of COPD, status post lumbar fusion, neuropathy, chronic pain, degenerative disc disease, spinal stenosis, hypertension presented with nausea, vomiting and abdominal pain.  On presentation, imaging showed possible small bowel obstruction with transition point in the mid jejunum.  General surgery was consulted.  He was started on IV fluids and analgesics.  Assessment & Plan:   Small bowel obstruction -General surgery following.  Had a small bowel movement this morning.  Diet advancement as per general surgery.  Continue IV fluids and analgesics and antiemetics as needed.  Chronic pain Degenerative disc disease/spinal stenosis Neuropathy -History of lumbar fusion.  Home oxycodone and gabapentin on hold for now.  Continue as needed Dilaudid IV for now  Leukocytosis -Resolved  Thrombocytosis -Resolved  Diabetes mellitus type 2 Continue CBGs with SSI.  Blood sugars currently controlled  COPD Stable.  Continue as needed albuterol  ?  Anxiety/depression -sertraline and trazodone on hold for now.   DVT prophylaxis: Start SCDs Code Status: Full Family Communication: None at bedside Disposition Plan: Status is: Inpatient Remains inpatient appropriate because: Of severity of illness.  Need for IV fluids.  Still in significant abdominal pain.    Consultants: General surgery  Procedures: None  Antimicrobials: None   Subjective: Patient seen and examined at bedside.  Still complains of some abdominal pain with back pain.  No overnight fever, chest pain or shortness of breath reported.  Had a small bowel movement this morning.  Objective: Vitals:   11/08/21 2100 11/08/21 2126 11/09/21 0142 11/09/21 0557  BP: 134/62 (!) 144/70 123/74 114/66  Pulse: 70 75 63 (!) 59  Resp: '17 18 18 18  '$ Temp: (!) 100.4 F (38  C) 99.5 F (37.5 C) 99 F (37.2 C) 98.4 F (36.9 C)  TempSrc: Oral Oral Oral Oral  SpO2: 98% 95% 95% 92%  Weight:      Height:        Intake/Output Summary (Last 24 hours) at 11/09/2021 1101 Last data filed at 11/09/2021 1000 Gross per 24 hour  Intake 575 ml  Output 500 ml  Net 75 ml   Filed Weights   11/08/21 1022  Weight: 86.2 kg    Examination:  General exam: Appears calm and comfortable looks chronically ill and deconditioned.  Currently on room air. Respiratory system: Bilateral decreased breath sounds at bases Cardiovascular system: S1 & S2 heard, Rate controlled Gastrointestinal system: Abdomen is distended, soft and slightly tender in the lower quadrant.  Bowel sounds sluggish. Extremities: No cyanosis, clubbing, edema  Central nervous system: Alert and oriented. No focal neurological deficits. Moving extremities Skin: No rashes, lesions or ulcers Psychiatry: Flat affect.  No signs of agitation.    Data Reviewed: I have personally reviewed following labs and imaging studies  CBC: Recent Labs  Lab 11/08/21 1051 11/09/21 0355  WBC 11.3* 10.5  HGB 14.2 12.2*  HCT 42.7 36.9*  MCV 86.6 86.4  PLT 437* 619   Basic Metabolic Panel: Recent Labs  Lab 11/08/21 1051 11/09/21 0355  NA 138 136  K 4.0 3.5  CL 95* 99  CO2 28 26  GLUCOSE 202* 116*  BUN 23 22  CREATININE 0.99 0.98  CALCIUM 9.4 8.7*   GFR: Estimated Creatinine Clearance: 85.8 mL/min (by C-G formula based on SCr of 0.98 mg/dL). Liver Function Tests: Recent Labs  Lab 11/08/21 1051 11/09/21  0355  AST 20 18  ALT 20 18  ALKPHOS 94 73  BILITOT 0.8 0.9  PROT 8.5* 7.1  ALBUMIN 3.7 3.2*   Recent Labs  Lab 11/08/21 1051  LIPASE 47   No results for input(s): "AMMONIA" in the last 168 hours. Coagulation Profile: No results for input(s): "INR", "PROTIME" in the last 168 hours. Cardiac Enzymes: No results for input(s): "CKTOTAL", "CKMB", "CKMBINDEX", "TROPONINI" in the last 168 hours. BNP  (last 3 results) No results for input(s): "PROBNP" in the last 8760 hours. HbA1C: No results for input(s): "HGBA1C" in the last 72 hours. CBG: Recent Labs  Lab 11/08/21 2129 11/08/21 2359 11/09/21 0356 11/09/21 0805  GLUCAP 137* 141* 116* 125*   Lipid Profile: No results for input(s): "CHOL", "HDL", "LDLCALC", "TRIG", "CHOLHDL", "LDLDIRECT" in the last 72 hours. Thyroid Function Tests: No results for input(s): "TSH", "T4TOTAL", "FREET4", "T3FREE", "THYROIDAB" in the last 72 hours. Anemia Panel: No results for input(s): "VITAMINB12", "FOLATE", "FERRITIN", "TIBC", "IRON", "RETICCTPCT" in the last 72 hours. Sepsis Labs: No results for input(s): "PROCALCITON", "LATICACIDVEN" in the last 168 hours.  Recent Results (from the past 240 hour(s))  Resp Panel by RT-PCR (Flu A&B, Covid) Urine, Clean Catch     Status: None   Collection Time: 11/09/21  9:30 AM   Specimen: Urine, Clean Catch; Nasal Swab  Result Value Ref Range Status   SARS Coronavirus 2 by RT PCR NEGATIVE NEGATIVE Final    Comment: (NOTE) SARS-CoV-2 target nucleic acids are NOT DETECTED.  The SARS-CoV-2 RNA is generally detectable in upper respiratory specimens during the acute phase of infection. The lowest concentration of SARS-CoV-2 viral copies this assay can detect is 138 copies/mL. A negative result does not preclude SARS-Cov-2 infection and should not be used as the sole basis for treatment or other patient management decisions. A negative result may occur with  improper specimen collection/handling, submission of specimen other than nasopharyngeal swab, presence of viral mutation(s) within the areas targeted by this assay, and inadequate number of viral copies(<138 copies/mL). A negative result must be combined with clinical observations, patient history, and epidemiological information. The expected result is Negative.  Fact Sheet for Patients:  EntrepreneurPulse.com.au  Fact Sheet for  Healthcare Providers:  IncredibleEmployment.be  This test is no t yet approved or cleared by the [place] FDA and  has been authorized for detection and/or diagnosis of SARS-CoV-2 by FDA under an Emergency Use Authorization (EUA). This EUA will remain  in effect (meaning this test can be used) for the duration of the COVID-19 declaration under Section 564(b)(1) of the Act, 21 U.S.C.section 360bbb-3(b)(1), unless the authorization is terminated  or revoked sooner.       Influenza A by PCR NEGATIVE NEGATIVE Final   Influenza B by PCR NEGATIVE NEGATIVE Final    Comment: (NOTE) The Xpert Xpress SARS-CoV-2/FLU/RSV plus assay is intended as an aid in the diagnosis of influenza from Nasopharyngeal swab specimens and should not be used as a sole basis for treatment. Nasal washings and aspirates are unacceptable for Xpert Xpress SARS-CoV-2/FLU/RSV testing.  Fact Sheet for Patients: EntrepreneurPulse.com.au  Fact Sheet for Healthcare Providers: IncredibleEmployment.be  This test is not yet approved or cleared by the [place] FDA and has been authorized for detection and/or diagnosis of SARS-CoV-2 by FDA under an Emergency Use Authorization (EUA). This EUA will remain in effect (meaning this test can be used) for the duration of the COVID-19 declaration under Section 564(b)(1) of the Act, 21 U.S.C. section 360bbb-3(b)(1), unless the authorization  is terminated or revoked.  Performed at [facility], [place] [address] [place], [place] [address]          Radiology Studies: CT ABDOMEN PELVIS W CONTRAST  Result Date: 11/08/2021 CLINICAL DATA:  Nausea and vomiting, evaluate for small bowel obstruction. EXAM: CT ABDOMEN AND PELVIS WITH CONTRAST TECHNIQUE: Multidetector CT imaging of the abdomen and pelvis was performed using the standard protocol following bolus administration of intravenous contrast.  RADIATION DOSE REDUCTION: This exam was performed according to the departmental dose-optimization program which includes automated exposure control, adjustment of the mA and/or kV according to patient size and/or use of iterative reconstruction technique. CONTRAST:  158m OMNIPAQUE IOHEXOL 300 MG/ML  SOLN COMPARISON:  CT abdomen and pelvis 02/19/2003. MRI abdomen 02/12/2005. FINDINGS: Lower chest: No acute abnormality. Hepatobiliary: There is diffuse fatty infiltration of the liver. Gallbladder and bile ducts are within normal limits. Pancreas: Unremarkable. No pancreatic ductal dilatation or surrounding inflammatory changes. Spleen: Normal in size without focal abnormality. Adrenals/Urinary Tract: Subcentimeter cysts are identified in the right kidney. No follow-up imaging recommended. Otherwise, the kidneys, adrenal glands, and bladder are within normal limits. Stomach/Bowel: Stomach is moderately distended with air-fluid level. Jejunal loops are dilated with air-fluid levels in the left upper quadrant measuring up to 3.3 cm. Duodenum is also dilated with air-fluid level. Transition to normal caliber is seen in the left upper quadrant and mid and distal small-bowel loops are decompressed. No focal wall thickening or inflammation identified. Colon and appendix are within normal limits. Vascular/Lymphatic: Aortic atherosclerosis. No enlarged abdominal or pelvic lymph nodes. Reproductive: Prostate is unremarkable. Other: No abdominal wall hernia or abnormality. No abdominopelvic ascites. Musculoskeletal: L3-L5 posterior fusion hardware is present. IMPRESSION: 1. Small-bowel obstruction with transition point within mid jejunum in the left upper quadrant. No free air. No focal inflammation. 2. Fatty infiltration of the liver. Electronically Signed   By: ARonney AstersM.D.   On: 11/08/2021 19:22        Scheduled Meds:  diatrizoate meglumine-sodium  90 mL Oral Once   insulin aspart  0-15 Units Subcutaneous Q4H    sodium chloride flush  3 mL Intravenous Q12H   Continuous Infusions:  sodium chloride            KAline August MD [facility] 11/09/2021, 11:01 AM

## 2021-11-10 ENCOUNTER — Inpatient Hospital Stay (HOSPITAL_COMMUNITY): Payer: 59

## 2021-11-10 DIAGNOSIS — K56609 Unspecified intestinal obstruction, unspecified as to partial versus complete obstruction: Secondary | ICD-10-CM | POA: Diagnosis not present

## 2021-11-10 DIAGNOSIS — R011 Cardiac murmur, unspecified: Secondary | ICD-10-CM

## 2021-11-10 LAB — BASIC METABOLIC PANEL
Anion gap: 9 (ref 5–15)
BUN: 15 mg/dL (ref 8–23)
CO2: 24 mmol/L (ref 22–32)
Calcium: 8.8 mg/dL — ABNORMAL LOW (ref 8.9–10.3)
Chloride: 103 mmol/L (ref 98–111)
Creatinine, Ser: 0.82 mg/dL (ref 0.61–1.24)
GFR, Estimated: >60 mL/min (ref >60)
Glucose, Bld: 106 mg/dL — ABNORMAL HIGH (ref 70–99)
Potassium: 3.6 mmol/L (ref 3.5–5.1)
Sodium: 136 mmol/L (ref 135–145)

## 2021-11-10 LAB — RAPID URINE DRUG SCREEN, HOSP PERFORMED
Amphetamines: NOT DETECTED
Barbiturates: NOT DETECTED
Benzodiazepines: NOT DETECTED
Cocaine: NOT DETECTED
Opiates: NOT DETECTED
Tetrahydrocannabinol: NOT DETECTED

## 2021-11-10 LAB — GLUCOSE, CAPILLARY
Glucose-Capillary: 111 mg/dL — ABNORMAL HIGH (ref 70–99)
Glucose-Capillary: 118 mg/dL — ABNORMAL HIGH (ref 70–99)
Glucose-Capillary: 130 mg/dL — ABNORMAL HIGH (ref 70–99)
Glucose-Capillary: 165 mg/dL — ABNORMAL HIGH (ref 70–99)
Glucose-Capillary: 182 mg/dL — ABNORMAL HIGH (ref 70–99)
Glucose-Capillary: 225 mg/dL — ABNORMAL HIGH (ref 70–99)

## 2021-11-10 LAB — ECHOCARDIOGRAM COMPLETE
AR max vel: 2.36 cm2
AV Area VTI: 2.23 cm2
AV Area mean vel: 2.32 cm2
AV Mean grad: 9 mmHg
AV Peak grad: 18.1 mmHg
Ao pk vel: 2.13 m/s
Area-P 1/2: 3 cm2
Height: 72 in
S' Lateral: 2.9 cm
Weight: 2888.91 oz

## 2021-11-10 LAB — MAGNESIUM: Magnesium: 2.1 mg/dL (ref 1.7–2.4)

## 2021-11-10 MED ORDER — LACTATED RINGERS IV SOLN: INTRAVENOUS | Status: AC

## 2021-11-10 MED ORDER — ONDANSETRON HCL 4 MG/2ML IJ SOLN
4.0000 mg | Freq: Four times a day (QID) | INTRAMUSCULAR | Status: DC | PRN
  Administered 2021-11-10: 4 mg via INTRAVENOUS
  Filled 2021-11-10: qty 2

## 2021-11-10 MED ORDER — PANTOPRAZOLE SODIUM 40 MG IV SOLR
40.0000 mg | Freq: Two times a day (BID) | INTRAVENOUS | Status: DC
  Administered 2021-11-10 (×2): 40 mg via INTRAVENOUS
  Filled 2021-11-10 (×2): qty 10

## 2021-11-10 NOTE — Progress Notes (Signed)
  Echocardiogram 2D Echocardiogram has been performed.  Darlina Sicilian M 11/10/2021, 1:29 PM

## 2021-11-10 NOTE — Progress Notes (Signed)
  Transition of Care [facility]) Screening Note   Patient Details  Name: EMMITTE SURGEON Date of Birth: 05-26-1960   Transition of Care [facility]) CM/SW Contact:    Vassie Moselle, LCSW Phone Number: 11/10/2021, 8:20 AM    Transition of Care Department [facility]) has reviewed patient and no TOC needs have been identified at this time. We will continue to monitor patient advancement through interdisciplinary progression rounds. If new patient transition needs arise, please place a TOC consult.

## 2021-11-10 NOTE — Progress Notes (Signed)
Subjective: Lots of bowel movements.  Contrast quickly in colon.   Objective: Vital signs in last 24 hours: Temp:  [98.4 F (36.9 C)-98.8 F (37.1 C)] 98.7 F (37.1 C) (08/02 0350) Pulse Rate:  [56-61] 56 (08/02 0350) Resp:  [18] 18 (08/02 0350) BP: (122-124)/(66-75) 123/66 (08/02 0350) SpO2:  [97 %-100 %] 97 % (08/02 0350) Weight:  [81.9 kg] 81.9 kg (08/02 0458) Last BM Date : 11/10/21  Intake/Output from previous day: 08/01 0701 - 08/02 0700 In: 1612.3 [P.O.:360; I.V.:1252.3] Out: 10 [Emesis/NG output:10] Intake/Output this shift: No intake/output data recorded.  PE: Abd: soft, nontender  Lab Results:  Recent Labs    11/08/21 1051 11/09/21 0355  WBC 11.3* 10.5  HGB 14.2 12.2*  HCT 42.7 36.9*  PLT 437* 384    BMET Recent Labs    11/09/21 0355 11/10/21 0348  NA 136 136  K 3.5 3.6  CL 99 103  CO2 26 24  GLUCOSE 116* 106*  BUN 22 15  CREATININE 0.98 0.82  CALCIUM 8.7* 8.8*    PT/INR No results for input(s): "LABPROT", "INR" in the last 72 hours. CMP     Component Value Date/Time   NA 136 11/10/2021 0348   K 3.6 11/10/2021 0348   CL 103 11/10/2021 0348   CO2 24 11/10/2021 0348   GLUCOSE 106 (H) 11/10/2021 0348   BUN 15 11/10/2021 0348   CREATININE 0.82 11/10/2021 0348   CALCIUM 8.8 (L) 11/10/2021 0348   PROT 7.1 11/09/2021 0355   ALBUMIN 3.2 (L) 11/09/2021 0355   AST 18 11/09/2021 0355   ALT 18 11/09/2021 0355   ALKPHOS 73 11/09/2021 0355   BILITOT 0.9 11/09/2021 0355   GFRNONAA >60 11/10/2021 0348   GFRAA >60 04/08/2019 0850   Lipase     Component Value Date/Time   LIPASE 47 11/08/2021 1051       Studies/Results: DG Abd Portable 1V-Small Bowel Obstruction Protocol-initial, 8 hr delay  Result Date: 11/09/2021 CLINICAL DATA:  Small-bowel obstruction. EXAM: PORTABLE ABDOMEN - 1 VIEW COMPARISON:  CT scans 11/08/2021 FINDINGS: 8 hour delay film after oral contrast demonstrates contrast in the colon. No findings for persistent  small bowel obstruction. No free air. IMPRESSION: Oral contrast is in the colon. No findings for small bowel obstruction or free air. Electronically Signed   By: Marijo Sanes M.D.   On: 11/09/2021 19:30   CT ABDOMEN PELVIS W CONTRAST  Result Date: 11/08/2021 CLINICAL DATA:  Nausea and vomiting, evaluate for small bowel obstruction. EXAM: CT ABDOMEN AND PELVIS WITH CONTRAST TECHNIQUE: Multidetector CT imaging of the abdomen and pelvis was performed using the standard protocol following bolus administration of intravenous contrast. RADIATION DOSE REDUCTION: This exam was performed according to the departmental dose-optimization program which includes automated exposure control, adjustment of the mA and/or kV according to patient size and/or use of iterative reconstruction technique. CONTRAST:  134m OMNIPAQUE IOHEXOL 300 MG/ML  SOLN COMPARISON:  CT abdomen and pelvis 02/19/2003. MRI abdomen 02/12/2005. FINDINGS: Lower chest: No acute abnormality. Hepatobiliary: There is diffuse fatty infiltration of the liver. Gallbladder and bile ducts are within normal limits. Pancreas: Unremarkable. No pancreatic ductal dilatation or surrounding inflammatory changes. Spleen: Normal in size without focal abnormality. Adrenals/Urinary Tract: Subcentimeter cysts are identified in the right kidney. No follow-up imaging recommended. Otherwise, the kidneys, adrenal glands, and bladder are within normal limits. Stomach/Bowel: Stomach is moderately distended with air-fluid level. Jejunal loops are dilated with air-fluid levels in the left upper quadrant  measuring up to 3.3 cm. Duodenum is also dilated with air-fluid level. Transition to normal caliber is seen in the left upper quadrant and mid and distal small-bowel loops are decompressed. No focal wall thickening or inflammation identified. Colon and appendix are within normal limits. Vascular/Lymphatic: Aortic atherosclerosis. No enlarged abdominal or pelvic lymph nodes.  Reproductive: Prostate is unremarkable. Other: No abdominal wall hernia or abnormality. No abdominopelvic ascites. Musculoskeletal: L3-L5 posterior fusion hardware is present. IMPRESSION: 1. Small-bowel obstruction with transition point within mid jejunum in the left upper quadrant. No free air. No focal inflammation. 2. Fatty infiltration of the liver. Electronically Signed   By: Ronney Asters M.D.   On: 11/08/2021 19:22    Anti-infectives: Anti-infectives (From admission, onward)    None        Assessment/Plan pSBO - concern for obstruction on CT, no obstruction on small bowel series - Regular diet - If he tolerates regular diet okay for discharge later today - Follow up with gastroenterology outpatient for screening colonoscopy ( he thinks he is due ) and to evaluate recent symptoms     FEN - Reg VTE - may have chemical prophylaxis from our standpoint ID - none needed  I reviewed hospitalist notes, last 24 h vitals and pain scores, last 48 h intake and output, last 24 h labs and trends, and last 24 h imaging results.   LOS: 1 day    Felicie Morn, [facility] 11/10/2021, 8:29 AM Please see Amion for pager number during day hours 7:00am-4:30pm or 7:00am -11:30am on weekends

## 2021-11-10 NOTE — Progress Notes (Signed)
PROGRESS NOTE    Miguel [name]  DGL:[id number] DOB: [birth year] DOA: 11/08/2021 PCP: Rory Percy, MD   Brief Narrative:   61 y.o. male with medical history significant of COPD, status post lumbar fusion, neuropathy, chronic pain, degenerative disc disease, spinal stenosis, hypertension presented with nausea, vomiting and abdominal pain.  On presentation, imaging showed possible small bowel obstruction with transition point in the mid jejunum.  General surgery was consulted.  He was started on IV fluids and analgesics.  Assessment & Plan:   N/V/ ab pain/ Small bowel obstruction -gen surg consulted, started on gastrografin small bowel protocol, repeat kub on 8/1 showed contrast in colon, gen surg advanced diet on 8/2 but he continue to c/o ab pain, n/v/d,  patient would like to try regular diet  though not sure he can tolerate- -he states he did not have diarrhea at home but started to have diarrhea after the gastrografin , Continue IV fluids and analgesics and antiemetics as needed, add on gi pcr panel for completeness, though diarrhea could be from the contrast -repeat kub  Fever/leukocytosis  Initially, will add on blood culture  Heart murmur Echocardiogram  Thrombocytosis -Resolved  Chronic pain Degenerative disc disease/spinal stenosis Neuropathy -History of lumbar fusion.  Home oxycodone and gabapentin on hold for now.  Continue as needed Dilaudid IV for now    Insulin dependent Diabetes mellitus type 2 Continue CBGs with SSI.  Blood sugars currently controlled  COPD Stable.  Continue as needed albuterol  ?  Anxiety/depression -sertraline and trazodone on hold for now.   DVT prophylaxis: Start SCDs Code Status: Full Family Communication: wife at bedside  Disposition Plan: Not ready to discharge, continue to have vomiting, ab pain, need iv fluids    Consultants: General surgery  Procedures: None  Antimicrobials: None   Subjective:  Vomited again this  morning, reports having diarrhea ( new after the contrast) Continue to have ab pain   Objective: Vitals:   11/09/21 1414 11/09/21 2100 11/10/21 0350 11/10/21 0458  BP: 122/70 124/75 123/66   Pulse: (!) 58 61 (!) 56   Resp: '18 18 18   '$ Temp: 98.4 F (36.9 C) 98.8 F (37.1 C) 98.7 F (37.1 C)   TempSrc: Oral Oral Oral   SpO2: 100% 100% 97%   Weight:    81.9 kg  Height:        Intake/Output Summary (Last 24 hours) at 11/10/2021 1041 Last data filed at 11/10/2021 1000 Gross per 24 hour  Intake 1672.27 ml  Output 10 ml  Net 1662.27 ml   Filed Weights   11/08/21 1022 11/10/21 0458  Weight: 86.2 kg 81.9 kg    Examination:  General exam: appear weak, does not look comfortable Respiratory system: Bilateral decreased breath sounds at bases Cardiovascular system: S1 & S2 heard, Rate controlled Gastrointestinal system: Abdomen is distended, soft and slightly tender in the lower quadrant.  Bowel sounds sluggish. Extremities: No cyanosis, clubbing, edema  Central nervous system: Alert and oriented. No focal neurological deficits. Moving extremities Skin: No rashes, lesions or ulcers Psychiatry: Flat affect.  No signs of agitation.    Data Reviewed: I have personally reviewed following labs and imaging studies  CBC: Recent Labs  Lab 11/08/21 1051 11/09/21 0355  WBC 11.3* 10.5  HGB 14.2 12.2*  HCT 42.7 36.9*  MCV 86.6 86.4  PLT 437* 518   Basic Metabolic Panel: Recent Labs  Lab 11/08/21 1051 11/09/21 0355 11/10/21 0348  NA 138 136 136  K 4.0 3.5 3.6  CL 95* 99 103  CO2 '28 26 24  '$ GLUCOSE 202* 116* 106*  BUN '23 22 15  '$ CREATININE 0.99 0.98 0.82  CALCIUM 9.4 8.7* 8.8*  MG  --   --  2.1   GFR: Estimated Creatinine Clearance: 102.5 mL/min (by C-G formula based on SCr of 0.82 mg/dL). Liver Function Tests: Recent Labs  Lab 11/08/21 1051 11/09/21 0355  AST 20 18  ALT 20 18  ALKPHOS 94 73  BILITOT 0.8 0.9  PROT 8.5* 7.1  ALBUMIN 3.7 3.2*   Recent Labs  Lab  11/08/21 1051  LIPASE 47   No results for input(s): "AMMONIA" in the last 168 hours. Coagulation Profile: No results for input(s): "INR", "PROTIME" in the last 168 hours. Cardiac Enzymes: No results for input(s): "CKTOTAL", "CKMB", "CKMBINDEX", "TROPONINI" in the last 168 hours. BNP (last 3 results) No results for input(s): "PROBNP" in the last 8760 hours. HbA1C: No results for input(s): "HGBA1C" in the last 72 hours. CBG: Recent Labs  Lab 11/09/21 1633 11/09/21 1930 11/09/21 2341 11/10/21 0351 11/10/21 0749  GLUCAP 158* 118* 123* 111* 130*   Lipid Profile: No results for input(s): "CHOL", "HDL", "LDLCALC", "TRIG", "CHOLHDL", "LDLDIRECT" in the last 72 hours. Thyroid Function Tests: No results for input(s): "TSH", "T4TOTAL", "FREET4", "T3FREE", "THYROIDAB" in the last 72 hours. Anemia Panel: No results for input(s): "VITAMINB12", "FOLATE", "FERRITIN", "TIBC", "IRON", "RETICCTPCT" in the last 72 hours. Sepsis Labs: No results for input(s): "PROCALCITON", "LATICACIDVEN" in the last 168 hours.  Recent Results (from the past 240 hour(s))  Resp Panel by RT-PCR (Flu A&B, Covid) Urine, Clean Catch     Status: None   Collection Time: 11/09/21  9:30 AM   Specimen: Urine, Clean Catch; Nasal Swab  Result Value Ref Range Status   SARS Coronavirus 2 by RT PCR NEGATIVE NEGATIVE Final    Comment: (NOTE) SARS-CoV-2 target nucleic acids are NOT DETECTED.  The SARS-CoV-2 RNA is generally detectable in upper respiratory specimens during the acute phase of infection. The lowest concentration of SARS-CoV-2 viral copies this assay can detect is 138 copies/mL. A negative result does not preclude SARS-Cov-2 infection and should not be used as the sole basis for treatment or other patient management decisions. A negative result may occur with  improper specimen collection/handling, submission of specimen other than nasopharyngeal swab, presence of viral mutation(s) within the areas targeted  by this assay, and inadequate number of viral copies(<138 copies/mL). A negative result must be combined with clinical observations, patient history, and epidemiological information. The expected result is Negative.  Fact Sheet for Patients:  EntrepreneurPulse.com.au  Fact Sheet for Healthcare Providers:  IncredibleEmployment.be  This test is no t yet approved or cleared by the [place] FDA and  has been authorized for detection and/or diagnosis of SARS-CoV-2 by FDA under an Emergency Use Authorization (EUA). This EUA will remain  in effect (meaning this test can be used) for the duration of the COVID-19 declaration under Section 564(b)(1) of the Act, 21 U.S.C.section 360bbb-3(b)(1), unless the authorization is terminated  or revoked sooner.       Influenza A by PCR NEGATIVE NEGATIVE Final   Influenza B by PCR NEGATIVE NEGATIVE Final    Comment: (NOTE) The Xpert Xpress SARS-CoV-2/FLU/RSV plus assay is intended as an aid in the diagnosis of influenza from Nasopharyngeal swab specimens and should not be used as a sole basis for treatment. Nasal washings and aspirates are unacceptable for Xpert Xpress SARS-CoV-2/FLU/RSV testing.  Fact Sheet for Patients: EntrepreneurPulse.com.au  Fact Sheet  for Healthcare Providers: IncredibleEmployment.be  This test is not yet approved or cleared by the [place] and has been authorized for detection and/or diagnosis of SARS-CoV-2 by FDA under an Emergency Use Authorization (EUA). This EUA will remain in effect (meaning this test can be used) for the duration of the COVID-19 declaration under Section 564(b)(1) of the Act, 21 U.S.C. section 360bbb-3(b)(1), unless the authorization is terminated or revoked.  Performed at [facility], [place] [address], [place], [place] [address]          Radiology Studies: DG Abd Portable 1V-Small  Bowel Obstruction Protocol-initial, 8 hr delay  Result Date: 11/09/2021 CLINICAL DATA:  Small-bowel obstruction. EXAM: PORTABLE ABDOMEN - 1 VIEW COMPARISON:  CT scans 11/08/2021 FINDINGS: 8 hour delay film after oral contrast demonstrates contrast in the colon. No findings for persistent small bowel obstruction. No free air. IMPRESSION: Oral contrast is in the colon. No findings for small bowel obstruction or free air. Electronically Signed   By: Marijo Sanes M.D.   On: 11/09/2021 19:30   CT ABDOMEN PELVIS W CONTRAST  Result Date: 11/08/2021 CLINICAL DATA:  Nausea and vomiting, evaluate for small bowel obstruction. EXAM: CT ABDOMEN AND PELVIS WITH CONTRAST TECHNIQUE: Multidetector CT imaging of the abdomen and pelvis was performed using the standard protocol following bolus administration of intravenous contrast. RADIATION DOSE REDUCTION: This exam was performed according to the departmental dose-optimization program which includes automated exposure control, adjustment of the mA and/or kV according to patient size and/or use of iterative reconstruction technique. CONTRAST:  14m OMNIPAQUE IOHEXOL 300 MG/ML  SOLN COMPARISON:  CT abdomen and pelvis 02/19/2003. MRI abdomen 02/12/2005. FINDINGS: Lower chest: No acute abnormality. Hepatobiliary: There is diffuse fatty infiltration of the liver. Gallbladder and bile ducts are within normal limits. Pancreas: Unremarkable. No pancreatic ductal dilatation or surrounding inflammatory changes. Spleen: Normal in size without focal abnormality. Adrenals/Urinary Tract: Subcentimeter cysts are identified in the right kidney. No follow-up imaging recommended. Otherwise, the kidneys, adrenal glands, and bladder are within normal limits. Stomach/Bowel: Stomach is moderately distended with air-fluid level. Jejunal loops are dilated with air-fluid levels in the left upper quadrant measuring up to 3.3 cm. Duodenum is also dilated with air-fluid level. Transition to normal  caliber is seen in the left upper quadrant and mid and distal small-bowel loops are decompressed. No focal wall thickening or inflammation identified. Colon and appendix are within normal limits. Vascular/Lymphatic: Aortic atherosclerosis. No enlarged abdominal or pelvic lymph nodes. Reproductive: Prostate is unremarkable. Other: No abdominal wall hernia or abnormality. No abdominopelvic ascites. Musculoskeletal: L3-L5 posterior fusion hardware is present. IMPRESSION: 1. Small-bowel obstruction with transition point within mid jejunum in the left upper quadrant. No free air. No focal inflammation. 2. Fatty infiltration of the liver. Electronically Signed   By: ARonney AstersM.D.   On: 11/08/2021 19:22        Scheduled Meds:  enoxaparin (LOVENOX) injection  40 mg Subcutaneous q1800   insulin aspart  0-15 Units Subcutaneous Q4H   pantoprazole (PROTONIX) IV  40 mg Intravenous Q12H   sodium chloride flush  3 mL Intravenous Q12H   Continuous Infusions:          FFlorencia Reasons MD PhD FACP [facility] 11/10/2021, 10:41 AM

## 2021-11-11 LAB — CBC WITH DIFFERENTIAL/PLATELET
Abs Immature Granulocytes: 0.03 10*3/uL (ref 0.00–0.07)
Basophils Absolute: 0 10*3/uL (ref 0.0–0.1)
Basophils Relative: 0 %
Eosinophils Absolute: 0.1 10*3/uL (ref 0.0–0.5)
Eosinophils Relative: 1 %
HCT: 35.3 % — ABNORMAL LOW (ref 39.0–52.0)
Hemoglobin: 11.8 g/dL — ABNORMAL LOW (ref 13.0–17.0)
Immature Granulocytes: 0 %
Lymphocytes Relative: 10 %
Lymphs Abs: 1 10*3/uL (ref 0.7–4.0)
MCH: 28.4 pg (ref 26.0–34.0)
MCHC: 33.4 g/dL (ref 30.0–36.0)
MCV: 85.1 fL (ref 80.0–100.0)
Monocytes Absolute: 0.8 10*3/uL (ref 0.1–1.0)
Monocytes Relative: 7 %
Neutro Abs: 8.4 10*3/uL — ABNORMAL HIGH (ref 1.7–7.7)
Neutrophils Relative %: 82 %
Platelets: 387 10*3/uL (ref 150–400)
RBC: 4.15 MIL/uL — ABNORMAL LOW (ref 4.22–5.81)
RDW: 13.2 % (ref 11.5–15.5)
WBC: 10.3 10*3/uL (ref 4.0–10.5)
nRBC: 0 % (ref 0.0–0.2)

## 2021-11-11 LAB — PHOSPHORUS: Phosphorus: 2.4 mg/dL — ABNORMAL LOW (ref 2.5–4.6)

## 2021-11-11 LAB — GLUCOSE, CAPILLARY
Glucose-Capillary: 184 mg/dL — ABNORMAL HIGH (ref 70–99)
Glucose-Capillary: 135 mg/dL — ABNORMAL HIGH (ref 70–99)
Glucose-Capillary: 260 mg/dL — ABNORMAL HIGH (ref 70–99)

## 2021-11-11 LAB — GASTROINTESTINAL PANEL BY PCR, STOOL (REPLACES STOOL CULTURE)

## 2021-11-11 LAB — BASIC METABOLIC PANEL
Anion gap: 6 (ref 5–15)
BUN: 11 mg/dL (ref 8–23)
CO2: 23 mmol/L (ref 22–32)
Calcium: 8.3 mg/dL — ABNORMAL LOW (ref 8.9–10.3)
Chloride: 103 mmol/L (ref 98–111)
Creatinine, Ser: 0.8 mg/dL (ref 0.61–1.24)
GFR, Estimated: >60 mL/min (ref >60)
Glucose, Bld: 165 mg/dL — ABNORMAL HIGH (ref 70–99)
Potassium: 3.5 mmol/L (ref 3.5–5.1)
Sodium: 132 mmol/L — ABNORMAL LOW (ref 135–145)

## 2021-11-11 LAB — MAGNESIUM: Magnesium: 2 mg/dL (ref 1.7–2.4)

## 2021-11-11 MED ORDER — K PHOS MONO-SOD PHOS DI & MONO 155-852-130 MG PO TABS
500.0000 mg | ORAL_TABLET | Freq: Once | ORAL | Status: AC
  Administered 2021-11-11: 500 mg via ORAL
  Filled 2021-11-11: qty 2

## 2021-11-11 MED ORDER — ENSURE MAX PROTEIN PO LIQD: 11.0000 [oz_av] | Freq: Every day | ORAL | Status: DC

## 2021-11-11 MED ORDER — PANTOPRAZOLE SODIUM 40 MG PO TBEC
40.0000 mg | DELAYED_RELEASE_TABLET | Freq: Two times a day (BID) | ORAL | Status: DC
  Administered 2021-11-11: 40 mg via ORAL
  Filled 2021-11-11: qty 1

## 2021-11-11 NOTE — Progress Notes (Signed)
Initial Nutrition Assessment  INTERVENTION:   -Ensure MAX Protein po daily, each supplement provides 150 kcal and 30 grams of protein   -Placed "Carbohydrate Counting" handout in AVS  NUTRITION DIAGNOSIS:   Inadequate oral intake related to nausea, vomiting, diarrhea as evidenced by per patient/family report.  GOAL:   Patient will meet greater than or equal to 90% of their needs  MONITOR:   PO intake, Weight trends, Labs, I & O's  REASON FOR ASSESSMENT:   Malnutrition Screening Tool    ASSESSMENT:   61 y.o. male with medical history significant of COPD, status post lumbar fusion, neuropathy, chronic pain, degenerative disc disease, spinal stenosis, hypertension presented with nausea, vomiting and abdominal pain.  Patient in room, wife at bedside. Pt reports having an appointment with an outpatient RD scheduled in a few weeks. States this appointment is for his diabetes management. Pt reports he only eats 1 meal day, typically meat and potatoes. Does not like many vegetables. He drinks a lot of sweet tea, used to drink soda but no longer does. Pt likes ice cream as well. We discussed switching to unsweetened beverages and trying to eat more consistently to help better control his blood sugars. Placed a "Carbohydrate Counting" handout in AVS.  Will order Ensure Max for pt to try given poor POs and increased diarrhea.  Reports taste changes where foods taste more metallic. States this started after taking metformin and glipizide.   Per weight  records, pt has lost 7 lbs since 6/14 (3% wt loss x 1.5 months, insignificant for time frame).  Medications reviewed.  Labs reviewed: CBGs: 118-260 Low Na Low Phos   NUTRITION - FOCUSED PHYSICAL EXAM:  No depletions noted.  Diet Order:   Diet Order             Diet regular Room service appropriate? Yes; Fluid consistency: Thin  Diet effective now                   EDUCATION NEEDS:   Education needs have been  addressed  Skin:  Skin Assessment: Reviewed RN Assessment  Last BM:  8/3  Height:   Ht Readings from Last 1 Encounters:  11/08/21 6' (1.829 m)    Weight:   Wt Readings from Last 1 Encounters:  11/11/21 83.5 kg   BMI:  Body mass index is 24.97 kg/m.  Estimated Nutritional Needs:   Kcal:  2000-2200  Protein:  90-100g  Fluid:  2L/day   Clayton Bibles, MS, RD, LDN Inpatient Clinical Dietitian Contact information available via Amion

## 2021-11-11 NOTE — Discharge Instructions (Signed)

## 2021-11-11 NOTE — Discharge Summary (Signed)
Discharge Summary  Miguel [name] OAC:[id number] DOB: 1960-05-25  PCP: Rory Percy, MD  Admit date: 11/08/2021 Discharge date: 11/11/2021 Time spent: 10mns  Recommendations for Outpatient Follow-up:  F/u with PCP within a week  for hospital discharge follow up, repeat cbc/bmp at follow up F/u with GI  Labs pending at discharge need to follow up after discharge: Blood culture   Discharge Diagnoses:  Active Hospital Problems   Diagnosis Date Noted   SBO (small bowel obstruction) ([facility] 11/08/2021   Leukocytosis 11/09/2021   Thrombocytosis 11/09/2021   Diabetes ([facility] 11/08/2021   Neuropathy of left superficial peroneal nerve 09/22/2021   S/P lumbar fusion 04/17/2019   Spinal stenosis of lumbar region 03/28/2019   Chronic pain syndrome 05/04/2017   Degeneration of lumbar intervertebral disc 05/04/2017   COPD (chronic obstructive pulmonary disease) ([facility] 11/21/2008    Resolved Hospital Problems  No resolved problems to display.    Discharge Condition: stable  Diet recommendation: heart healthy/carb modified  Filed Weights   11/08/21 1022 11/10/21 0458 11/11/21 0359  Weight: 86.2 kg 81.9 kg 83.5 kg    History of present illness: ( per admitting MD Dr MTrilby Drummer Patient coming from: Home   Chief Complaint: Abdominal pain, nausea, vomiting   HPI: Miguel SOLANKIis a 61y.o. male with medical history significant of COPD, status post lumbar fusion, neuropathy, chronic pain, degenerative disc disease, spinal stenosis, hypertension**presenting with nausea vomiting abdominal pain.  Patient states he had symptoms for about a week.  He initially was experiencing diarrhea but this is improved and transitioned to constipation; he has not had a bowel movement for the past 4 to 5 days.  The past 2 to 3 days, he has had significant nausea and vomiting with inability to tolerate any p.o. and will vomit immediately after drinking.  Denies any bloody or dark stools.  He does continue to  pass flatus.  He denies fevers, chills, chest pain, shortness of breath.   ED Course: Vital signs in the ED stable.  Lab work-up included CMP with chloride 95, glucose 202, protein 8.5.  CBC with leukocytosis 11.1, platelets 437.  Troponin negative x2.  Lipase normal.  Rester panel for flu COVID pending.  Urinalysis pending.  CT of the abdomen pelvis shows small bowel obstruction with transition point in the mid jejunum.  No evidence of free air.  No focal formation.  Patient received Pepcid, Zofran, liter fluids in the ED.  General surgery consulted and will see the patient.  Hospital Course:  Principal Problem:   SBO (small bowel obstruction) (HCC) Active Problems:   COPD (chronic obstructive pulmonary disease) (HCC)   S/P lumbar fusion   Neuropathy of left superficial peroneal nerve   Chronic pain syndrome   Degeneration of lumbar intervertebral disc   Spinal stenosis of lumbar region   Diabetes (HCC)   Leukocytosis   Thrombocytosis   Assessment and Plan:   N/V/ ab pain/ Small bowel obstruction -possible viral etiology -gen surg consulted, started on gastrografin small bowel protocol, repeat kub on 8/1 showed contrast in colon, -symptom improved, he tolerated regular diet, he is cleared to discharge home by gen surg  -he is advised to g/u with pcp and GI   Fever/leukocytosis  Wbc normalized SARScov2 screening negative Gi prc panel negative, diarrhea/ab pain resolved blood culture no growth day 1, patient feels better, strongly desires to go home, does not want to wait for the final blood culture result, he is advised to close  f/u with pcp  for final blood culture result   Heart murmur Echocardiogram no vegetation reported, LVEF 55 to 60%, mild left ventricular hypertrophy, grade 1 diastolic dysfunction, moderate calcification of the aortic valve aortic valve sclerosis/calcification without evidence of aortic stenosis, mild dilation of ascending aorta -f/u with pcp    Thrombocytosis -Resolved  Hypophosphatemia Replaced Oral intake improved   Chronic pain Degenerative disc disease/spinal stenosis Neuropathy -History of lumbar fusion.  continue Home meds oxycodone and gabapentin   Insulin dependent Diabetes mellitus type 2 Continue CBGs with SSI.  Blood sugars currently controlled F/u with pcp  Fatty infiltration of the liver. Incidental finding on CT abdomen Follow-up with PCP  COPD Stable.  Continue as needed albuterol  ?  Anxiety/depression -on sertraline and trazodone at home , f/u with pcp      Discharge Exam: BP 129/66 (BP Location: Left Arm)   Pulse (!) 57   Temp 99.1 F (37.3 C) (Oral)   Resp 18   Ht 6' (1.829 m)   Wt 83.5 kg   SpO2 99%   BMI 24.97 kg/m   General: NAD Cardiovascular: RRR Respiratory: normal respiratory effort     Discharge Instructions     Diet - low sodium heart healthy   Complete by: As directed    Carb modified diet   Increase activity slowly   Complete by: As directed       Allergies as of 11/11/2021       Reactions   Cymbalta [duloxetine Hcl] Other (See Comments)   Agitation   Statins Other (See Comments)   Extreme fatigue, myalgias (lipitor, zocor, crestor)   Zolpidem Other (See Comments)   Made the patient sleep walk        Medication List     TAKE these medications    Aleve 220 MG tablet Generic drug: naproxen sodium Take 220-440 mg by mouth 2 (two) times daily as needed (for pain).   ASPIRIN 81 PO Take 81 mg by mouth See admin instructions. Take 81 mg by mouth every other night   gabapentin 600 MG tablet Commonly known as: NEURONTIN Take 600 mg by mouth 2 (two) times daily.   Lantus SoloStar 100 UNIT/ML Solostar Pen Generic drug: insulin glargine Inject 30 Units into the skin at bedtime.   lisinopril 5 MG tablet Commonly known as: ZESTRIL Take 5 mg by mouth daily.   Oxycodone HCl 20 MG Tabs Take 20 mg by mouth 6 (six) times daily.   sertraline 100 MG  tablet Commonly known as: ZOLOFT Take 100 mg by mouth at bedtime.   tadalafil 20 MG tablet Commonly known as: CIALIS Take 20 mg by mouth daily as needed for erectile dysfunction.   tiZANidine 4 MG tablet Commonly known as: ZANAFLEX Take 4 mg by mouth every 8 (eight) hours as needed for muscle spasms.   traZODone 50 MG tablet Commonly known as: DESYREL Take 50 mg by mouth at bedtime.   Trulicity 8.84 ZY/6.0YT Sopn Generic drug: Dulaglutide Inject 0.75 mg into the skin every Tuesday.       Allergies  Allergen Reactions   Cymbalta [Duloxetine Hcl] Other (See Comments)    Agitation    Statins Other (See Comments)    Extreme fatigue, myalgias (lipitor, zocor, crestor)   Zolpidem Other (See Comments)    Made the patient sleep walk    Follow-up Information     Rory Percy, MD Follow up in 1 week(s).   Specialty: Family Medicine Why: hospital discharge follow up, repeat basic lab works  including cbc/bmp at follow up pcp to follow up on final blood culture result Contact information: [address] [place] [address] [phone]         f/u with gastroenterology Follow up in 3 week(s).                   The results of significant diagnostics from this hospitalization (including imaging, microbiology, ancillary and laboratory) are listed below for reference.    Significant Diagnostic Studies: ECHOCARDIOGRAM COMPLETE  Result Date: 11/10/2021    ECHOCARDIOGRAM REPORT   Patient Name:   Miguel [name] Date of Exam: 11/10/2021 Medical Rec #:  [id number]        Height:       72.0 in Accession #:    [id number]       Weight:       180.6 lb Date of Birth:  03-03-1961         BSA:          2.040 m Patient Age:    79 years         BP:           123/66 mmHg Patient Gender: M                HR:           56 bpm. Exam Location:  Inpatient Procedure: 2D Echo, Cardiac Doppler and Color Doppler Indications:    Murmur R01.1  History:        Patient has no prior history of  Echocardiogram examinations.                 Risk Factors:Hypertension, Diabetes, Dyslipidemia and Sleep                 Apnea.  Sonographer:    Darlina Sicilian RDCS Referring Phys: [id number] [address] [place]  Sonographer Comments: PEDOF not performed per patient request. IMPRESSIONS  1. Left ventricular ejection fraction, by estimation, is 55 to 60%. The left ventricle has normal function. The left ventricle has no regional wall motion abnormalities. There is mild left ventricular hypertrophy. Left ventricular diastolic parameters are consistent with Grade I diastolic dysfunction (impaired relaxation).  2. Right ventricular systolic function is normal. The right ventricular size is normal. There is normal pulmonary artery systolic pressure. The estimated right ventricular systolic pressure is 85.6 mmHg.  3. The mitral valve is normal in structure. No evidence of mitral valve regurgitation.  4. The aortic valve is tricuspid. There is moderate calcification of the aortic valve. Aortic valve regurgitation is not visualized. Aortic valve sclerosis/calcification is present, without any evidence of aortic stenosis. Aortic valve mean gradient measures 9.0 mmHg.  5. Aortic dilatation noted. There is mild dilatation of the ascending aorta, measuring 39 mm.  6. The inferior vena cava is normal in size with greater than 50% respiratory variability, suggesting right atrial pressure of 3 mmHg. FINDINGS  Left Ventricle: Left ventricular ejection fraction, by estimation, is 55 to 60%. The left ventricle has normal function. The left ventricle has no regional wall motion abnormalities. The left ventricular internal cavity size was normal in size. There is  mild left ventricular hypertrophy. Left ventricular diastolic parameters are consistent with Grade I diastolic dysfunction (impaired relaxation). Right Ventricle: The right ventricular size is normal. No increase in right ventricular wall thickness. Right ventricular systolic function is  normal. There is normal pulmonary artery systolic pressure. The tricuspid regurgitant velocity is 2.46 m/s, and  with an assumed  right atrial pressure of 3 mmHg, the estimated right ventricular systolic pressure is 51.7 mmHg. Left Atrium: Left atrial size was normal in size. Right Atrium: Right atrial size was normal in size. Pericardium: There is no evidence of pericardial effusion. Mitral Valve: The mitral valve is normal in structure. No evidence of mitral valve regurgitation. Tricuspid Valve: The tricuspid valve is normal in structure. Tricuspid valve regurgitation is trivial. Aortic Valve: The aortic valve is tricuspid. There is moderate calcification of the aortic valve. Aortic valve regurgitation is not visualized. Aortic valve sclerosis/calcification is present, without any evidence of aortic stenosis. Aortic valve mean gradient measures 9.0 mmHg. Aortic valve peak gradient measures 18.1 mmHg. Aortic valve area, by VTI measures 2.23 cm. Pulmonic Valve: The pulmonic valve was normal in structure. Pulmonic valve regurgitation is trivial. Aorta: The aortic root is normal in size and structure and aortic dilatation noted. There is mild dilatation of the ascending aorta, measuring 39 mm. Venous: The inferior vena cava is normal in size with greater than 50% respiratory variability, suggesting right atrial pressure of 3 mmHg. IAS/Shunts: No atrial level shunt detected by color flow Doppler.  LEFT VENTRICLE PLAX 2D LVIDd:         4.70 cm   Diastology LVIDs:         2.90 cm   LV e' medial:    6.20 cm/s LV PW:         0.80 cm   LV E/e' medial:  15.1 LV IVS:        1.40 cm   LV e' lateral:   8.92 cm/s LVOT diam:     2.27 cm   LV E/e' lateral: 10.5 LV SV:         93 LV SV Index:   46 LVOT Area:     4.06 cm  RIGHT VENTRICLE RV S prime:     17.30 cm/s TAPSE (M-mode): 2.7 cm LEFT ATRIUM             Index        RIGHT ATRIUM           Index LA diam:        3.00 cm 1.47 cm/m   RA Area:     12.20 cm LA Vol (A2C):   42.1  ml 20.64 ml/m  RA Volume:   27.70 ml  13.58 ml/m LA Vol (A4C):   61.5 ml 30.15 ml/m LA Biplane Vol: 52.9 ml 25.93 ml/m  AORTIC VALVE AV Area (Vmax):    2.36 cm AV Area (Vmean):   2.32 cm AV Area (VTI):     2.23 cm AV Vmax:           212.50 cm/s AV Vmean:          140.000 cm/s AV VTI:            0.417 m AV Peak Grad:      18.1 mmHg AV Mean Grad:      9.0 mmHg LVOT Vmax:         123.38 cm/s LVOT Vmean:        80.087 cm/s LVOT VTI:          0.229 m LVOT/AV VTI ratio: 0.55  AORTA Ao Root diam: 3.60 cm Ao Asc diam:  3.90 cm MITRAL VALVE               TRICUSPID VALVE MV Area (PHT): 3.00 cm    TR Peak grad:   24.2 mmHg MV Decel  Time: 253 msec    TR Vmax:        246.00 cm/s MV E velocity: 93.40 cm/s MV A velocity: 97.40 cm/s  SHUNTS MV E/A ratio:  0.96        Systemic VTI:  0.23 m                            Systemic Diam: 2.27 cm [place] Electronically signed by Franki Monte Signature Date/Time: 11/10/2021/7:21:11 PM    Final    DG Abd 1 View  Result Date: 11/10/2021 CLINICAL DATA:  Nausea vomiting. EXAM: ABDOMEN - 1 VIEW COMPARISON:  November 09, 2021 FINDINGS: Persistent dilation of proximal small bowel measuring up to 4.4 cm. Normal colonic bowel gas pattern. IMPRESSION: Persistent dilation of proximal small bowel measuring up to 4.4 cm, suggestive of small bowel obstruction. Electronically Signed   By: Fidela Salisbury M.D.   On: 11/10/2021 11:09   DG Abd Portable 1V-Small Bowel Obstruction Protocol-initial, 8 hr delay  Result Date: 11/09/2021 CLINICAL DATA:  Small-bowel obstruction. EXAM: PORTABLE ABDOMEN - 1 VIEW COMPARISON:  CT scans 11/08/2021 FINDINGS: 8 hour delay film after oral contrast demonstrates contrast in the colon. No findings for persistent small bowel obstruction. No free air. IMPRESSION: Oral contrast is in the colon. No findings for small bowel obstruction or free air. Electronically Signed   By: Marijo Sanes M.D.   On: 11/09/2021 19:30   CT ABDOMEN PELVIS W  CONTRAST  Result Date: 11/08/2021 CLINICAL DATA:  Nausea and vomiting, evaluate for small bowel obstruction. EXAM: CT ABDOMEN AND PELVIS WITH CONTRAST TECHNIQUE: Multidetector CT imaging of the abdomen and pelvis was performed using the standard protocol following bolus administration of intravenous contrast. RADIATION DOSE REDUCTION: This exam was performed according to the departmental dose-optimization program which includes automated exposure control, adjustment of the mA and/or kV according to patient size and/or use of iterative reconstruction technique. CONTRAST:  1101m OMNIPAQUE IOHEXOL 300 MG/ML  SOLN COMPARISON:  CT abdomen and pelvis 02/19/2003. MRI abdomen 02/12/2005. FINDINGS: Lower chest: No acute abnormality. Hepatobiliary: There is diffuse fatty infiltration of the liver. Gallbladder and bile ducts are within normal limits. Pancreas: Unremarkable. No pancreatic ductal dilatation or surrounding inflammatory changes. Spleen: Normal in size without focal abnormality. Adrenals/Urinary Tract: Subcentimeter cysts are identified in the right kidney. No follow-up imaging recommended. Otherwise, the kidneys, adrenal glands, and bladder are within normal limits. Stomach/Bowel: Stomach is moderately distended with air-fluid level. Jejunal loops are dilated with air-fluid levels in the left upper quadrant measuring up to 3.3 cm. Duodenum is also dilated with air-fluid level. Transition to normal caliber is seen in the left upper quadrant and mid and distal small-bowel loops are decompressed. No focal wall thickening or inflammation identified. Colon and appendix are within normal limits. Vascular/Lymphatic: Aortic atherosclerosis. No enlarged abdominal or pelvic lymph nodes. Reproductive: Prostate is unremarkable. Other: No abdominal wall hernia or abnormality. No abdominopelvic ascites. Musculoskeletal: L3-L5 posterior fusion hardware is present. IMPRESSION: 1. Small-bowel obstruction with transition point  within mid jejunum in the left upper quadrant. No free air. No focal inflammation. 2. Fatty infiltration of the liver. Electronically Signed   By: ARonney AstersM.D.   On: 11/08/2021 19:22    Microbiology: Recent Results (from the past 240 hour(s))  Resp Panel by RT-PCR (Flu A&B, Covid) Urine, Clean Catch     Status: None   Collection Time: 11/09/21  9:30 AM   Specimen: Urine, Clean Catch;  Nasal Swab  Result Value Ref Range Status   SARS Coronavirus 2 by RT PCR NEGATIVE NEGATIVE Final    Comment: (NOTE) SARS-CoV-2 target nucleic acids are NOT DETECTED.  The SARS-CoV-2 RNA is generally detectable in upper respiratory specimens during the acute phase of infection. The lowest concentration of SARS-CoV-2 viral copies this assay can detect is 138 copies/mL. A negative result does not preclude SARS-Cov-2 infection and should not be used as the sole basis for treatment or other patient management decisions. A negative result may occur with  improper specimen collection/handling, submission of specimen other than nasopharyngeal swab, presence of viral mutation(s) within the areas targeted by this assay, and inadequate number of viral copies(<138 copies/mL). A negative result must be combined with clinical observations, patient history, and epidemiological information. The expected result is Negative.  Fact Sheet for Patients:  EntrepreneurPulse.com.au  Fact Sheet for Healthcare Providers:  IncredibleEmployment.be  This test is no t yet approved or cleared by the [place] FDA and  has been authorized for detection and/or diagnosis of SARS-CoV-2 by FDA under an Emergency Use Authorization (EUA). This EUA will remain  in effect (meaning this test can be used) for the duration of the COVID-19 declaration under Section 564(b)(1) of the Act, 21 U.S.C.section 360bbb-3(b)(1), unless the authorization is terminated  or revoked sooner.       Influenza A  by PCR NEGATIVE NEGATIVE Final   Influenza B by PCR NEGATIVE NEGATIVE Final    Comment: (NOTE) The Xpert Xpress SARS-CoV-2/FLU/RSV plus assay is intended as an aid in the diagnosis of influenza from Nasopharyngeal swab specimens and should not be used as a sole basis for treatment. Nasal washings and aspirates are unacceptable for Xpert Xpress SARS-CoV-2/FLU/RSV testing.  Fact Sheet for Patients: EntrepreneurPulse.com.au  Fact Sheet for Healthcare Providers: IncredibleEmployment.be  This test is not yet approved or cleared by the [place] FDA and has been authorized for detection and/or diagnosis of SARS-CoV-2 by FDA under an Emergency Use Authorization (EUA). This EUA will remain in effect (meaning this test can be used) for the duration of the COVID-19 declaration under Section 564(b)(1) of the Act, 21 U.S.C. section 360bbb-3(b)(1), unless the authorization is terminated or revoked.  Performed at [facility], [place] [address] [place], [place] [address]   Gastrointestinal Panel by PCR , Stool     Status: None   Collection Time: 11/10/21 11:09 AM   Specimen: Urine, Clean Catch; Stool  Result Value Ref Range Status   Campylobacter species NOT DETECTED NOT DETECTED Final   Plesimonas shigelloides NOT DETECTED NOT DETECTED Final   Salmonella species NOT DETECTED NOT DETECTED Final   Yersinia enterocolitica NOT DETECTED NOT DETECTED Final   Vibrio species NOT DETECTED NOT DETECTED Final   Vibrio cholerae NOT DETECTED NOT DETECTED Final   Enteroaggregative E coli (EAEC) NOT DETECTED NOT DETECTED Final   Enteropathogenic E coli (EPEC) NOT DETECTED NOT DETECTED Final   Enterotoxigenic E coli (ETEC) NOT DETECTED NOT DETECTED Final   Shiga like toxin producing E coli (STEC) NOT DETECTED NOT DETECTED Final   Shigella/Enteroinvasive E coli (EIEC) NOT DETECTED NOT DETECTED Final   Cryptosporidium NOT DETECTED NOT DETECTED Final    Cyclospora cayetanensis NOT DETECTED NOT DETECTED Final   Entamoeba histolytica NOT DETECTED NOT DETECTED Final   Giardia lamblia NOT DETECTED NOT DETECTED Final   Adenovirus F40/41 NOT DETECTED NOT DETECTED Final   Astrovirus NOT DETECTED NOT DETECTED Final   Norovirus GI/GII NOT DETECTED NOT DETECTED Final  Rotavirus A NOT DETECTED NOT DETECTED Final   Sapovirus (I, II, IV, and V) NOT DETECTED NOT DETECTED Final    Comment: Performed at [facility], [place] [place], [place] [address]  Culture, blood (Routine X 2) w Reflex to ID Panel     Status: None (Preliminary result)   Collection Time: 11/10/21 11:30 AM   Specimen: BLOOD  Result Value Ref Range Status   Specimen Description   Final    BLOOD LEFT ANTECUBITAL Performed at [facility] [address], [place], [place] [address]    Special Requests   Final    BOTTLES DRAWN AEROBIC ONLY Blood Culture adequate volume Performed at [facility] [address], [place], [place] [address]    Culture   Final    NO GROWTH 1 DAY Performed at [facility], [place] [address] [place], [place] [address]    Report Status PENDING  Incomplete  Culture, blood (Routine X 2) w Reflex to ID Panel     Status: None (Preliminary result)   Collection Time: 11/10/21 11:32 AM   Specimen: BLOOD  Result Value Ref Range Status   Specimen Description   Final    BLOOD BLOOD LEFT FOREARM Performed at [facility] [address], [place], [place] [address]    Special Requests   Final    BOTTLES DRAWN AEROBIC ONLY Blood Culture adequate volume Performed at [facility] [address] [place], [place] [address]    Culture   Final    NO GROWTH 1 DAY Performed at [facility], [place] [address], [place], [place] [address]    Report Status PENDING  Incomplete     Labs: Basic Metabolic Panel: Recent Labs  Lab 11/08/21 1051 11/09/21 0355  11/10/21 0348 11/11/21 0357  NA 138 136 136 132*  K 4.0 3.5 3.6 3.5  CL 95* 99 103 103  CO2 '28 26 24 23  '$ GLUCOSE 202* 116* 106* 165*  BUN '23 22 15 11  '$ CREATININE 0.99 0.98 0.82 0.80  CALCIUM 9.4 8.7* 8.8* 8.3*  MG  --   --  2.1 2.0  PHOS  --   --   --  2.4*   Liver Function Tests: Recent Labs  Lab 11/08/21 1051 11/09/21 0355  AST 20 18  ALT 20 18  ALKPHOS 94 73  BILITOT 0.8 0.9  PROT 8.5* 7.1  ALBUMIN 3.7 3.2*   Recent Labs  Lab 11/08/21 1051  LIPASE 47   No results for input(s): "AMMONIA" in the last 168 hours. CBC: Recent Labs  Lab 11/08/21 1051 11/09/21 0355 11/11/21 0357  WBC 11.3* 10.5 10.3  NEUTROABS  --   --  8.4*  HGB 14.2 12.2* 11.8*  HCT 42.7 36.9* 35.3*  MCV 86.6 86.4 85.1  PLT 437* 384 387   Cardiac Enzymes: No results for input(s): "CKTOTAL", "CKMB", "CKMBINDEX", "TROPONINI" in the last 168 hours. BNP: BNP (last 3 results) No results for input(s): "BNP" in the last 8760 hours.  ProBNP (last 3 results) No results for input(s): "PROBNP" in the last 8760 hours.  CBG: Recent Labs  Lab 11/10/21 1950 11/10/21 2343 11/11/21 0357 11/11/21 0730 11/11/21 1143  GLUCAP 225* 118* 184* 135* 260*    FURTHER DISCHARGE INSTRUCTIONS:   Get Medicines reviewed and adjusted: Please take all your medications with you for your next visit with your Primary MD   Laboratory/radiological data: Please request your Primary MD to go  over all hospital tests and procedure/radiological results at the follow up, please ask your Primary MD to get all Hospital records sent to his/her office.   In some cases, they will be blood work, cultures and biopsy results pending at the time of your discharge. Please request that your primary care M.D. goes through all the records of your hospital data and follows up on these results.   Also Note the following: If you experience worsening of your admission symptoms, develop shortness of breath, life threatening emergency,  suicidal or homicidal thoughts you must seek medical attention immediately by calling 911 or calling your MD immediately  if symptoms less severe.   You must read complete instructions/literature along with all the possible adverse reactions/side effects for all the Medicines you take and that have been prescribed to you. Take any new Medicines after you have completely understood and accpet all the possible adverse reactions/side effects.    Do not drive when taking Pain medications or sleeping medications (Benzodaizepines)   Do not take more than prescribed Pain, Sleep and Anxiety Medications. It is not advisable to combine anxiety,sleep and pain medications without talking with your primary care practitioner   Special Instructions: If you have smoked or chewed Tobacco  in the last 2 yrs please stop smoking, stop any regular Alcohol  and or any Recreational drug use.   Wear Seat belts while driving.   Please note: You were cared for by a hospitalist during your hospital stay. Once you are discharged, your primary care physician will handle any further medical issues. Please note that NO REFILLS for any discharge medications will be authorized once you are discharged, as it is imperative that you return to your primary care physician (or establish a relationship with a primary care physician if you do not have one) for your post hospital discharge needs so that they can reassess your need for medications and monitor your lab values.     Signed:  Florencia Reasons MD, PhD, FACP  [facility] 11/11/2021, 2:52 PM

## 2021-11-11 NOTE — Progress Notes (Signed)
Subjective: No further diarrhea.  Tolerating solid diet with no nausea and abdominal pain resolved.   Objective: Vital signs in last 24 hours: Temp:  [99.1 F (37.3 C)-100.4 F (38 C)] 100.4 F (38 C) (08/03 0354) Pulse Rate:  [54-61] 57 (08/03 0354) Resp:  [18] 18 (08/03 0354) BP: (112-126)/(63-69) 117/69 (08/03 0354) SpO2:  [97 %-99 %] 97 % (08/03 0354) Weight:  [83.5 kg] 83.5 kg (08/03 0359) Last BM Date : 11/11/21  Intake/Output from previous day: 08/02 0701 - 08/03 0700 In: 790.1 [P.O.:420; I.V.:370.1] Out: 700 [Urine:700] Intake/Output this shift: No intake/output data recorded.  PE: Abd: soft, nontender, ND  Lab Results:  Recent Labs    11/09/21 0355 11/11/21 0357  WBC 10.5 10.3  HGB 12.2* 11.8*  HCT 36.9* 35.3*  PLT 384 387   BMET Recent Labs    11/10/21 0348 11/11/21 0357  NA 136 132*  K 3.6 3.5  CL 103 103  CO2 24 23  GLUCOSE 106* 165*  BUN 15 11  CREATININE 0.82 0.80  CALCIUM 8.8* 8.3*   PT/INR No results for input(s): "LABPROT", "INR" in the last 72 hours. CMP     Component Value Date/Time   NA 132 (L) 11/11/2021 0357   K 3.5 11/11/2021 0357   CL 103 11/11/2021 0357   CO2 23 11/11/2021 0357   GLUCOSE 165 (H) 11/11/2021 0357   BUN 11 11/11/2021 0357   CREATININE 0.80 11/11/2021 0357   CALCIUM 8.3 (L) 11/11/2021 0357   PROT 7.1 11/09/2021 0355   ALBUMIN 3.2 (L) 11/09/2021 0355   AST 18 11/09/2021 0355   ALT 18 11/09/2021 0355   ALKPHOS 73 11/09/2021 0355   BILITOT 0.9 11/09/2021 0355   GFRNONAA >60 11/11/2021 0357   GFRAA >60 04/08/2019 0850   Lipase     Component Value Date/Time   LIPASE 47 11/08/2021 1051       Studies/Results: ECHOCARDIOGRAM COMPLETE  Result Date: 11/10/2021    ECHOCARDIOGRAM REPORT   Patient Name:   Miguel [name] Date of Exam: 11/10/2021 Medical Rec #:  [id number]        Height:       72.0 in Accession #:    [id number]       Weight:       180.6 lb Date of Birth:  February 20, 1961         BSA:           2.040 m Patient Age:    61 years         BP:           123/66 mmHg Patient Gender: M                HR:           56 bpm. Exam Location:  Inpatient Procedure: 2D Echo, Cardiac Doppler and Color Doppler Indications:    Murmur R01.1  History:        Patient has no prior history of Echocardiogram examinations.                 Risk Factors:Hypertension, Diabetes, Dyslipidemia and Sleep                 Apnea.  Sonographer:    Darlina Sicilian RDCS Referring Phys: [id number] [place] XU  Sonographer Comments: PEDOF not performed per patient request. IMPRESSIONS  1. Left ventricular ejection fraction, by estimation, is 55 to 60%. The left ventricle has normal function. The left  ventricle has no regional wall motion abnormalities. There is mild left ventricular hypertrophy. Left ventricular diastolic parameters are consistent with Grade I diastolic dysfunction (impaired relaxation).  2. Right ventricular systolic function is normal. The right ventricular size is normal. There is normal pulmonary artery systolic pressure. The estimated right ventricular systolic pressure is 24.0 mmHg.  3. The mitral valve is normal in structure. No evidence of mitral valve regurgitation.  4. The aortic valve is tricuspid. There is moderate calcification of the aortic valve. Aortic valve regurgitation is not visualized. Aortic valve sclerosis/calcification is present, without any evidence of aortic stenosis. Aortic valve mean gradient measures 9.0 mmHg.  5. Aortic dilatation noted. There is mild dilatation of the ascending aorta, measuring 39 mm.  6. The inferior vena cava is normal in size with greater than 50% respiratory variability, suggesting right atrial pressure of 3 mmHg. FINDINGS  Left Ventricle: Left ventricular ejection fraction, by estimation, is 55 to 60%. The left ventricle has normal function. The left ventricle has no regional wall motion abnormalities. The left ventricular internal cavity size was normal in size. There is  mild left  ventricular hypertrophy. Left ventricular diastolic parameters are consistent with Grade I diastolic dysfunction (impaired relaxation). Right Ventricle: The right ventricular size is normal. No increase in right ventricular wall thickness. Right ventricular systolic function is normal. There is normal pulmonary artery systolic pressure. The tricuspid regurgitant velocity is 2.46 m/s, and  with an assumed right atrial pressure of 3 mmHg, the estimated right ventricular systolic pressure is 97.3 mmHg. Left Atrium: Left atrial size was normal in size. Right Atrium: Right atrial size was normal in size. Pericardium: There is no evidence of pericardial effusion. Mitral Valve: The mitral valve is normal in structure. No evidence of mitral valve regurgitation. Tricuspid Valve: The tricuspid valve is normal in structure. Tricuspid valve regurgitation is trivial. Aortic Valve: The aortic valve is tricuspid. There is moderate calcification of the aortic valve. Aortic valve regurgitation is not visualized. Aortic valve sclerosis/calcification is present, without any evidence of aortic stenosis. Aortic valve mean gradient measures 9.0 mmHg. Aortic valve peak gradient measures 18.1 mmHg. Aortic valve area, by VTI measures 2.23 cm. Pulmonic Valve: The pulmonic valve was normal in structure. Pulmonic valve regurgitation is trivial. Aorta: The aortic root is normal in size and structure and aortic dilatation noted. There is mild dilatation of the ascending aorta, measuring 39 mm. Venous: The inferior vena cava is normal in size with greater than 50% respiratory variability, suggesting right atrial pressure of 3 mmHg. IAS/Shunts: No atrial level shunt detected by color flow Doppler.  LEFT VENTRICLE PLAX 2D LVIDd:         4.70 cm   Diastology LVIDs:         2.90 cm   LV e' medial:    6.20 cm/s LV PW:         0.80 cm   LV E/e' medial:  15.1 LV IVS:        1.40 cm   LV e' lateral:   8.92 cm/s LVOT diam:     2.27 cm   LV E/e' lateral:  10.5 LV SV:         93 LV SV Index:   46 LVOT Area:     4.06 cm  RIGHT VENTRICLE RV S prime:     17.30 cm/s TAPSE (M-mode): 2.7 cm LEFT ATRIUM             Index  RIGHT ATRIUM           Index LA diam:        3.00 cm 1.47 cm/m   RA Area:     12.20 cm LA Vol (A2C):   42.1 ml 20.64 ml/m  RA Volume:   27.70 ml  13.58 ml/m LA Vol (A4C):   61.5 ml 30.15 ml/m LA Biplane Vol: 52.9 ml 25.93 ml/m  AORTIC VALVE AV Area (Vmax):    2.36 cm AV Area (Vmean):   2.32 cm AV Area (VTI):     2.23 cm AV Vmax:           212.50 cm/s AV Vmean:          140.000 cm/s AV VTI:            0.417 m AV Peak Grad:      18.1 mmHg AV Mean Grad:      9.0 mmHg LVOT Vmax:         123.38 cm/s LVOT Vmean:        80.087 cm/s LVOT VTI:          0.229 m LVOT/AV VTI ratio: 0.55  AORTA Ao Root diam: 3.60 cm Ao Asc diam:  3.90 cm MITRAL VALVE               TRICUSPID VALVE MV Area (PHT): 3.00 cm    TR Peak grad:   24.2 mmHg MV Decel Time: 253 msec    TR Vmax:        246.00 cm/s MV E velocity: 93.40 cm/s MV A velocity: 97.40 cm/s  SHUNTS MV E/A ratio:  0.96        Systemic VTI:  0.23 m                            Systemic Diam: 2.27 cm Dalton McleanMD Electronically signed by Franki Monte Signature Date/Time: 11/10/2021/7:21:11 PM    Final    DG Abd 1 View  Result Date: 11/10/2021 CLINICAL DATA:  Nausea vomiting. EXAM: ABDOMEN - 1 VIEW COMPARISON:  November 09, 2021 FINDINGS: Persistent dilation of proximal small bowel measuring up to 4.4 cm. Normal colonic bowel gas pattern. IMPRESSION: Persistent dilation of proximal small bowel measuring up to 4.4 cm, suggestive of small bowel obstruction. Electronically Signed   By: Fidela Salisbury M.D.   On: 11/10/2021 11:09   DG Abd Portable 1V-Small Bowel Obstruction Protocol-initial, 8 hr delay  Result Date: 11/09/2021 CLINICAL DATA:  Small-bowel obstruction. EXAM: PORTABLE ABDOMEN - 1 VIEW COMPARISON:  CT scans 11/08/2021 FINDINGS: 8 hour delay film after oral contrast demonstrates contrast in the  colon. No findings for persistent small bowel obstruction. No free air. IMPRESSION: Oral contrast is in the colon. No findings for small bowel obstruction or free air. Electronically Signed   By: Marijo Sanes M.D.   On: 11/09/2021 19:30    Anti-infectives: Anti-infectives (From admission, onward)    None        Assessment/Plan pSBO - concern for obstruction on CT, no obstruction on small bowel series - Regular diet - surgically stable for DC home - Follow up with gastroenterology outpatient for screening colonoscopy ( he thinks he is due ) and to evaluate recent symptoms     FEN - Reg VTE - may have chemical prophylaxis from our standpoint ID - none needed  I reviewed hospitalist notes, last 24 h vitals and pain scores, last 48 h intake and  output, last 24 h labs and trends, and last 24 h imaging results.   LOS: 2 days    Henreitta Cea, [facility] 11/11/2021, 11:16 AM Please see Amion for pager number during day hours 7:00am-4:30pm or 7:00am -11:30am on weekends

## 2021-11-12 ENCOUNTER — Emergency Department (HOSPITAL_COMMUNITY)
Admission: EM | Admit: 2021-11-12 | Discharge: 2021-11-12 | Disposition: A | Discharge status: Home / Self Care | Payer: 59 | Attending: Student | Admitting: Student

## 2021-11-12 ENCOUNTER — Emergency Department (HOSPITAL_COMMUNITY): Payer: 59

## 2021-11-12 ENCOUNTER — Other Ambulatory Visit: Payer: Self-pay

## 2021-11-12 ENCOUNTER — Encounter (HOSPITAL_COMMUNITY): Payer: Self-pay

## 2021-11-12 DIAGNOSIS — I10 Essential (primary) hypertension: Secondary | ICD-10-CM | POA: Insufficient documentation

## 2021-11-12 DIAGNOSIS — R112 Nausea with vomiting, unspecified: Secondary | ICD-10-CM | POA: Insufficient documentation

## 2021-11-12 DIAGNOSIS — E119 Type 2 diabetes mellitus without complications: Secondary | ICD-10-CM | POA: Diagnosis not present

## 2021-11-12 DIAGNOSIS — R1031 Right lower quadrant pain: Secondary | ICD-10-CM | POA: Insufficient documentation

## 2021-11-12 DIAGNOSIS — Z7982 Long term (current) use of aspirin: Secondary | ICD-10-CM | POA: Insufficient documentation

## 2021-11-12 DIAGNOSIS — J449 Chronic obstructive pulmonary disease, unspecified: Secondary | ICD-10-CM | POA: Diagnosis not present

## 2021-11-12 DIAGNOSIS — R509 Fever, unspecified: Secondary | ICD-10-CM | POA: Diagnosis not present

## 2021-11-12 DIAGNOSIS — Z794 Long term (current) use of insulin: Secondary | ICD-10-CM | POA: Insufficient documentation

## 2021-11-12 DIAGNOSIS — Z79899 Other long term (current) drug therapy: Secondary | ICD-10-CM | POA: Diagnosis not present

## 2021-11-12 LAB — CBC WITH DIFFERENTIAL/PLATELET
Abs Immature Granulocytes: 0.06 10*3/uL (ref 0.00–0.07)
Basophils Absolute: 0 10*3/uL (ref 0.0–0.1)
Basophils Relative: 0 %
Eosinophils Absolute: 0 10*3/uL (ref 0.0–0.5)
Eosinophils Relative: 0 %
HCT: 35.5 % — ABNORMAL LOW (ref 39.0–52.0)
Hemoglobin: 11.9 g/dL — ABNORMAL LOW (ref 13.0–17.0)
Immature Granulocytes: 1 %
Lymphocytes Relative: 6 %
Lymphs Abs: 0.7 10*3/uL (ref 0.7–4.0)
MCH: 28.7 pg (ref 26.0–34.0)
MCHC: 33.5 g/dL (ref 30.0–36.0)
MCV: 85.5 fL (ref 80.0–100.0)
Monocytes Absolute: 0.8 10*3/uL (ref 0.1–1.0)
Monocytes Relative: 7 %
Neutro Abs: 10 10*3/uL — ABNORMAL HIGH (ref 1.7–7.7)
Neutrophils Relative %: 86 %
Platelets: 405 10*3/uL — ABNORMAL HIGH (ref 150–400)
RBC: 4.15 MIL/uL — ABNORMAL LOW (ref 4.22–5.81)
RDW: 13.5 % (ref 11.5–15.5)
WBC: 11.5 10*3/uL — ABNORMAL HIGH (ref 4.0–10.5)
nRBC: 0 % (ref 0.0–0.2)

## 2021-11-12 LAB — COMPREHENSIVE METABOLIC PANEL
ALT: 18 U/L (ref 0–44)
AST: 15 U/L (ref 15–41)
Albumin: 3.1 g/dL — ABNORMAL LOW (ref 3.5–5.0)
Alkaline Phosphatase: 70 U/L (ref 38–126)
Anion gap: 10 (ref 5–15)
BUN: 9 mg/dL (ref 8–23)
CO2: 24 mmol/L (ref 22–32)
Calcium: 8.9 mg/dL (ref 8.9–10.3)
Chloride: 101 mmol/L (ref 98–111)
Creatinine, Ser: 1.03 mg/dL (ref 0.61–1.24)
GFR, Estimated: >60 mL/min (ref >60)
Glucose, Bld: 248 mg/dL — ABNORMAL HIGH (ref 70–99)
Potassium: 3.4 mmol/L — ABNORMAL LOW (ref 3.5–5.1)
Sodium: 135 mmol/L (ref 135–145)
Total Bilirubin: 0.8 mg/dL (ref 0.3–1.2)
Total Protein: 7 g/dL (ref 6.5–8.1)

## 2021-11-12 LAB — URINALYSIS, ROUTINE W REFLEX MICROSCOPIC
Bilirubin Urine: NEGATIVE
Glucose, UA: 50 mg/dL — AB
Hgb urine dipstick: NEGATIVE
Ketones, ur: NEGATIVE mg/dL
Leukocytes,Ua: NEGATIVE
Nitrite: NEGATIVE
Protein, ur: NEGATIVE mg/dL
Specific Gravity, Urine: >1.046 — ABNORMAL HIGH (ref 1.005–1.030)
pH: 6 (ref 5.0–8.0)

## 2021-11-12 LAB — LIPASE, BLOOD: Lipase: 93 U/L — ABNORMAL HIGH (ref 11–51)

## 2021-11-12 MED ORDER — ONDANSETRON HCL 4 MG/2ML IJ SOLN
4.0000 mg | Freq: Once | INTRAMUSCULAR | Status: AC
  Administered 2021-11-12: 4 mg via INTRAVENOUS
  Filled 2021-11-12: qty 2

## 2021-11-12 MED ORDER — POLYETHYLENE GLYCOL 3350 17 G PO PACK: 17.0000 g | PACK | Freq: Every day | ORAL | 0 refills | Status: AC

## 2021-11-12 MED ORDER — SODIUM CHLORIDE 0.9 % IV BOLUS
1000.0000 mL | Freq: Once | INTRAVENOUS | Status: AC
  Administered 2021-11-12: 1000 mL via INTRAVENOUS

## 2021-11-12 MED ORDER — SODIUM CHLORIDE (PF) 0.9 % IJ SOLN
INTRAMUSCULAR | Status: AC
  Filled 2021-11-12: qty 50

## 2021-11-12 MED ORDER — MORPHINE SULFATE (PF) 4 MG/ML IV SOLN
4.0000 mg | Freq: Once | INTRAVENOUS | Status: AC
  Administered 2021-11-12: 4 mg via INTRAVENOUS
  Filled 2021-11-12: qty 1

## 2021-11-12 MED ORDER — IOHEXOL 300 MG/ML  SOLN
100.0000 mL | Freq: Once | INTRAMUSCULAR | Status: AC | PRN
  Administered 2021-11-12: 100 mL via INTRAVENOUS

## 2021-11-12 NOTE — ED Triage Notes (Signed)
Discharged from [facility] yesterday ~3PM after being admitted for SBO.   Woke up this morning with nausea and 2 episodes of vomiting.

## 2021-11-12 NOTE — ED Provider Notes (Signed)
Miguel [name]   CSN: [id number] Arrival date & time: 11/12/21  4562     History PMH: Tobacco use, depression, hyperlipidemia, diabetes, hypertension, history of MI, OSA, copd Chief Complaint  Patient presents with   Abdominal Pain    Miguel [name] is a 61 y.o. male. Patient presents with nausea and vomiting since around 4 AM this morning.  He also complains of a fever at home to 101.7.  He has had 2 episodes of emesis.  He also reports pain that has been gradually worsening in his right lower quadrant that started last night.  He was recently discharged yesterday with a small bowel obstruction.  Per patient, the doctors recommended that he stay in the hospital for further observation, patient wanted to leave early.  Patient did have a low-grade fever while in the hospital.  He did not require NG tube or any surgery.  He does report that he had a bowel movement yesterday, and is passing gas today.  He reports no history of appendectomy, cholecystectomy, or colectomy.   Abdominal Pain Associated symptoms: fever, nausea and vomiting   Associated symptoms: no chest pain, no constipation, no diarrhea, no dysuria, no hematuria and no shortness of breath        Home Medications Prior to Admission medications   Medication Sig Start Date End Date Taking? Authorizing Provider  polyethylene glycol (MIRALAX) 17 g packet Take 17 g by mouth daily for 14 days. 11/12/21 11/26/21 Yes Helia Haese, Adora Fridge, PA-C  ALEVE 220 MG tablet Take 220-440 mg by mouth 2 (two) times daily as needed (for pain).    [provider]  ASPIRIN 81 PO Take 81 mg by mouth See admin instructions. Take 81 mg by mouth every other night    [provider]  Dulaglutide (TRULICITY) 5.63 SL/3.7DS SOPN Inject 0.75 mg into the skin every Tuesday.    [provider]  gabapentin (NEURONTIN) 600 MG tablet Take 600 mg by mouth 2 (two) times daily.    [provider]  Insulin Glargine (LANTUS SOLOSTAR) 100 UNIT/ML Solostar Pen Inject 30 Units into the skin at bedtime.    [provider]  lisinopril (ZESTRIL) 5 MG tablet Take 5 mg by mouth daily.    [provider]  Oxycodone HCl 20 MG TABS Take 20 mg by mouth 6 (six) times daily.    [provider]  sertraline (ZOLOFT) 100 MG tablet Take 100 mg by mouth at bedtime.    [provider]  tadalafil (CIALIS) 20 MG tablet Take 20 mg by mouth daily as needed for erectile dysfunction.    [provider]  tiZANidine (ZANAFLEX) 4 MG tablet Take 4 mg by mouth every 8 (eight) hours as needed for muscle spasms.    [provider]  traZODone (DESYREL) 50 MG tablet Take 50 mg by mouth at bedtime.    [provider]      Allergies    Cymbalta [duloxetine hcl], Statins, and Zolpidem    Review of Systems   Review of Systems  Constitutional:  Positive for fever.  Respiratory:  Negative for shortness of breath.   Cardiovascular:  Negative for chest pain.  Gastrointestinal:  Positive for abdominal distention, abdominal pain, nausea and vomiting. Negative for constipation and diarrhea.  Genitourinary:  Negative for dysuria, flank pain and hematuria.  All other systems reviewed and are negative.   Physical Exam Updated Vital Signs BP 126/66   Pulse 65  Temp 100.2 F (37.9 C) (Oral)   Resp 16   Ht 6' (1.829 m)   Wt 83.5 kg   SpO2 99%   BMI 24.95 kg/m  Physical Exam Vitals and nursing [name] reviewed.  Constitutional:      General: He is not in acute distress.    Appearance: Normal appearance. He is well-developed. He is not ill-appearing, toxic-appearing or diaphoretic.  HENT:     Head: Normocephalic and atraumatic.     Nose: No nasal deformity.     Mouth/Throat:     Lips: Pink. No lesions.  Eyes:     General: Gaze aligned appropriately. No scleral icterus.       Right eye: No discharge.        Left eye: No discharge.      Conjunctiva/sclera: Conjunctivae normal.     Right eye: Right conjunctiva is not injected. No exudate or hemorrhage.    Left eye: Left conjunctiva is not injected. No exudate or hemorrhage. Cardiovascular:     Rate and Rhythm: Normal rate and regular rhythm.  Pulmonary:     Effort: Pulmonary effort is normal. No respiratory distress.  Abdominal:     General: Abdomen is flat. There is distension.     Palpations: Abdomen is soft.     Tenderness: There is abdominal tenderness in the right lower quadrant. There is no right CVA tenderness, left CVA tenderness, guarding or rebound. Positive signs include McBurney's sign. Negative signs include Murphy's sign and Rovsing's sign.     Hernia: No hernia is present.     Comments: Tympanic sounds to percussion  Skin:    General: Skin is warm and dry.  Neurological:     Mental Status: He is alert and oriented to person, place, and time.  Psychiatric:        Mood and Affect: Mood normal.        Speech: Speech normal.        Behavior: Behavior normal. Behavior is cooperative.     ED Results / Procedures / Treatments   Labs (all labs ordered are listed, but only abnormal results are displayed) Labs Reviewed  CBC WITH DIFFERENTIAL/PLATELET - Abnormal; Notable for the following components:      Result Value   WBC 11.5 (*)    RBC 4.15 (*)    Hemoglobin 11.9 (*)    HCT 35.5 (*)    Platelets 405 (*)    Neutro Abs 10.0 (*)    All other components within normal limits  COMPREHENSIVE METABOLIC PANEL - Abnormal; Notable for the following components:   Potassium 3.4 (*)    Glucose, Bld 248 (*)    Albumin 3.1 (*)    All other components within normal limits  LIPASE, BLOOD - Abnormal; Notable for the following components:   Lipase 93 (*)    All other components within normal limits  URINALYSIS, ROUTINE W REFLEX MICROSCOPIC - Abnormal; Notable for the following components:   Specific Gravity, Urine >1.046 (*)    Glucose, UA 50 (*)    All other  components within normal limits    EKG None  Radiology CT Abdomen Pelvis W Contrast  Result Date: 11/12/2021 CLINICAL DATA:  RLQ abdominal pain (Age >= 14y) Bowel obstruction suspected. Patient was discharged from [facility] yesterday approximately 3 p.m. after being admitted for SBO. Woke up this morning with nausea and 2 episodes of vomiting EXAM: CT ABDOMEN AND PELVIS WITH CONTRAST TECHNIQUE: Multidetector CT imaging of the abdomen and pelvis was  performed using the standard protocol following bolus administration of intravenous contrast. RADIATION DOSE REDUCTION: This exam was performed according to the departmental dose-optimization program which includes automated exposure control, adjustment of the mA and/or kV according to patient size and/or use of iterative reconstruction technique. CONTRAST:  142m OMNIPAQUE IOHEXOL 300 MG/ML  SOLN COMPARISON:  November 08, 2021 FINDINGS: Lower chest: No acute abnormality. Hepatobiliary: No focal liver abnormality is seen. Hepatic steatosis. No gallstones, gallbladder wall thickening, or biliary dilatation. Pancreas: Unremarkable. No pancreatic ductal dilatation or surrounding inflammatory changes. Spleen: Normal in size without focal abnormality. Adrenals/Urinary Tract: Adrenal glands are unremarkable. Kidneys are normal, without renal calculi, focal lesion, or hydronephrosis. Bladder is unremarkable. Stomach/Bowel: Stomach is within normal limits. Appendix appears normal. No evidence of bowel wall thickening, distention, or inflammatory changes. Previously seen dilatation of the duodenum and jejunum has significantly resolved. Moderately large stool burden predominantly in the ascending colon. Mild diverticulosis of the sigmoid colon without diverticulitis. Vascular/Lymphatic: No significant vascular findings are present. No significantly enlarged abdominal or pelvic lymph nodes. Subcentimeter inguinal lymph nodes. Reproductive: Prostate is unremarkable. Other: No  abdominal wall hernia or abnormality. No abdominopelvic ascites. Small air pockets in the anterior pelvic wall. Musculoskeletal: Again seen are the laminectomy/fusion changes in the lumbar spine from L3 through L5 with intervertebral disc cages. Moderate lumbar spondylosis. IMPRESSION: 1. There has been interval significant resolution of the proximal small bowel obstruction. Previously seen dilated duodenum and jejunum have near completely resolved with minimal dilatation of the duodenum and duodenal jejunal junction measuring 3 cm in greatest dimension. 2.  Hepatic steatosis. 3. No free air, fluid, inflammatory changes, mass or significant lymphadenopathy. Electronically Signed   By: AFrazier RichardsM.D.   On: 11/12/2021 09:41   ECHOCARDIOGRAM COMPLETE  Result Date: 11/10/2021    ECHOCARDIOGRAM REPORT   Patient Name:   Miguel CARMICKLEDate of Exam: 11/10/2021 Medical Rec #:  [id number]       Height:       72.0 in Accession #:    [id number]      Weight:       180.6 lb Date of Birth:  [birth year]        BSA:          2.040 m Patient Age:    89years         BP:           123/66 mmHg Patient Gender: M                HR:           56 bpm. Exam Location:  Inpatient Procedure: 2D Echo, Cardiac Doppler and Color Doppler Indications:    Murmur R01.1  History:        Patient has no prior history of Echocardiogram examinations.                 Risk Factors:Hypertension, Diabetes, Dyslipidemia and Sleep                 Apnea.  Sonographer:    TDarlina SicilianRDCS Referring Phys: [id number]  Sonographer Comments: PEDOF not performed per patient request. IMPRESSIONS  1. Left ventricular ejection fraction, by estimation, is 55 to 60%. The left ventricle has normal function. The left ventricle has no regional wall motion abnormalities. There is mild left ventricular hypertrophy. Left ventricular diastolic parameters are consistent with Grade I diastolic dysfunction (impaired relaxation).  2. Right ventricular systolic  function  is normal. The right ventricular size is normal. There is normal pulmonary artery systolic pressure. The estimated right ventricular systolic pressure is 96.7 mmHg.  3. The mitral valve is normal in structure. No evidence of mitral valve regurgitation.  4. The aortic valve is tricuspid. There is moderate calcification of the aortic valve. Aortic valve regurgitation is not visualized. Aortic valve sclerosis/calcification is present, without any evidence of aortic stenosis. Aortic valve mean gradient measures 9.0 mmHg.  5. Aortic dilatation noted. There is mild dilatation of the ascending aorta, measuring 39 mm.  6. The inferior vena cava is normal in size with greater than 50% respiratory variability, suggesting right atrial pressure of 3 mmHg. FINDINGS  Left Ventricle: Left ventricular ejection fraction, by estimation, is 55 to 60%. The left ventricle has normal function. The left ventricle has no regional wall motion abnormalities. The left ventricular internal cavity size was normal in size. There is  mild left ventricular hypertrophy. Left ventricular diastolic parameters are consistent with Grade I diastolic dysfunction (impaired relaxation). Right Ventricle: The right ventricular size is normal. No increase in right ventricular wall thickness. Right ventricular systolic function is normal. There is normal pulmonary artery systolic pressure. The tricuspid regurgitant velocity is 2.46 m/s, and  with an assumed right atrial pressure of 3 mmHg, the estimated right ventricular systolic pressure is 89.3 mmHg. Left Atrium: Left atrial size was normal in size. Right Atrium: Right atrial size was normal in size. Pericardium: There is no evidence of pericardial effusion. Mitral Valve: The mitral valve is normal in structure. No evidence of mitral valve regurgitation. Tricuspid Valve: The tricuspid valve is normal in structure. Tricuspid valve regurgitation is trivial. Aortic Valve: The aortic valve is tricuspid.  There is moderate calcification of the aortic valve. Aortic valve regurgitation is not visualized. Aortic valve sclerosis/calcification is present, without any evidence of aortic stenosis. Aortic valve mean gradient measures 9.0 mmHg. Aortic valve peak gradient measures 18.1 mmHg. Aortic valve area, by VTI measures 2.23 cm. Pulmonic Valve: The pulmonic valve was normal in structure. Pulmonic valve regurgitation is trivial. Aorta: The aortic root is normal in size and structure and aortic dilatation noted. There is mild dilatation of the ascending aorta, measuring 39 mm. Venous: The inferior vena cava is normal in size with greater than 50% respiratory variability, suggesting right atrial pressure of 3 mmHg. IAS/Shunts: No atrial level shunt detected by color flow Doppler.  LEFT VENTRICLE PLAX 2D LVIDd:         4.70 cm   Diastology LVIDs:         2.90 cm   LV e' medial:    6.20 cm/s LV PW:         0.80 cm   LV E/e' medial:  15.1 LV IVS:        1.40 cm   LV e' lateral:   8.92 cm/s LVOT diam:     2.27 cm   LV E/e' lateral: 10.5 LV SV:         93 LV SV Index:   46 LVOT Area:     4.06 cm  RIGHT VENTRICLE RV S prime:     17.30 cm/s TAPSE (M-mode): 2.7 cm LEFT ATRIUM             Index        RIGHT ATRIUM           Index LA diam:        3.00 cm 1.47 cm/m   RA Area:  12.20 cm LA Vol (A2C):   42.1 ml 20.64 ml/m  RA Volume:   27.70 ml  13.58 ml/m LA Vol (A4C):   61.5 ml 30.15 ml/m LA Biplane Vol: 52.9 ml 25.93 ml/m  AORTIC VALVE AV Area (Vmax):    2.36 cm AV Area (Vmean):   2.32 cm AV Area (VTI):     2.23 cm AV Vmax:           212.50 cm/s AV Vmean:          140.000 cm/s AV VTI:            0.417 m AV Peak Grad:      18.1 mmHg AV Mean Grad:      9.0 mmHg LVOT Vmax:         123.38 cm/s LVOT Vmean:        80.087 cm/s LVOT VTI:          0.229 m LVOT/AV VTI ratio: 0.55  AORTA Ao Root diam: 3.60 cm Ao Asc diam:  3.90 cm MITRAL VALVE               TRICUSPID VALVE MV Area (PHT): 3.00 cm    TR Peak grad:   24.2 mmHg MV  Decel Time: 253 msec    TR Vmax:        246.00 cm/s MV E velocity: 93.40 cm/s MV A velocity: 97.40 cm/s  SHUNTS MV E/A ratio:  0.96        Systemic VTI:  0.23 m                            Systemic Diam: 2.27 cm Dalton McleanMD Electronically signed by Franki Monte Signature Date/Time: 11/10/2021/7:21:11 PM    Final     Procedures Procedures    Medications Ordered in ED Medications  sodium chloride 0.9 % bolus 1,000 mL (0 mLs Intravenous Stopped 11/12/21 0916)  ondansetron (ZOFRAN) injection 4 mg (4 mg Intravenous Given 11/12/21 0733)  morphine (PF) 4 MG/ML injection 4 mg (4 mg Intravenous Given 11/12/21 0734)  iohexol (OMNIPAQUE) 300 MG/ML solution 100 mL (100 mLs Intravenous Contrast Given 11/12/21 0853)  sodium chloride (PF) 0.9 % injection (  Given by Other 11/12/21 0915)    ED Course/ Medical Decision Making/ A&P                           Medical Decision Making Amount and/or Complexity of Data Reviewed Labs: ordered. Radiology: ordered.  Risk Prescription drug management.    MDM  This is a 61 y.o. male who presents to the ED with abdominal pain, nausea, and vomiting since 0500 The differential of this patient includes but is not limited to appendicitis, bowel obstruction, UTI, hernia, diverticulitis, constipation, UTI  Initial Impression  Patient has stable vitals and has temperature to 98.5 here. He was recently discharged after having a small bowel obstruction.  His most recent CT abdomen pelvis was on July 31 this specifically identifying no appendicitis but did show small bowel obstruction with transition point within the mid jejunum.  He had a repeat KUB on 8/2 that showed persistent dilation of proximal small bowel. He was starting to tolerate diet and had BM yesterday so was recommended discharge. He did have low grade fevers and a slight leukocytosis which normalized and had negative blood cultures. ECHO was done without any vegetations present.  Today he presents after return  of n/v and high fever at home to 101.7. He also reports RLQ tenderness. He does have + McBurney's sign so this is concerning for possible appendicitis versus recurrence of SBO in the setting of recent bowel obstruction. Will repeat CT a/p to evaluate.  I personally ordered, reviewed, and interpreted all laboratory work and imaging and agree with radiologist interpretation. Results interpreted below: WBC 11.5, Hgb 11.9 (stable), plt 405 (stable) K 3.4, no other electrolyte, LFT, or renal abnormalities. Glucose 248, no AG or acidosis noted.  Lipase is elevated to 93 UA with 50 glucose, no ketones, Elevated spec grav CT a/p reveals significant resolution of the proximal small bowel obstruction.  This is near completely resolved with minimal dilatation of the duodenum.  He does have a large stool burden in the ascending colon.  There is no signs of appendicitis or other acute abnormalities.  Assessment/Plan:  Patient has not had any further episodes of emesis since he has been here.  He has tolerated p.o. without difficulty.  His CT reveals resolved small bowel obstruction and no signs of appendicitis.  He does have a large stool burden of the ascending colon which would explain his right lower quadrant tenderness.  He also had a low-grade fever here up to 100.3. It is possible that patient has some sort of virus or bacterial GI illness that is not clearing due to constipation. I recommend that he start on daily miralax since he is not on a bowel protocol. I did review labs done during recent admission where patient had negative GI pathogen panel, COVID/Flu testing, and blood cultures. With continued fever, I think it is very important that patient follow up with his PCP. I do not think he needs to be readmitted at this time with resolution of obstruction and no other surgical causes identified. He is given strict return precautions.   Charting Requirements Additional history is obtained from:  Independent  historian and Spouse/Significant Other External Records from outside source obtained and reviewed including: Recent admission, recent blood cultures and other laboratory data Social Determinants of Health:  none Pertinant PMH that complicates patient's illness: recent hx of sbo  Patient Care Problems that were addressed during this visit: - nausea/vomiting: Acute illness with systemic symptoms - RLQ abdominal pain: Acute illness with complication - Constipation: Acute illness with complication This patient was maintained on a cardiac monitor/telemetry. I personally viewed and interpreted the cardiac monitor which reveals an underlying rhythm of NSr Medications given in ED: IVF, morphine, zofran Reevaluation of the patient after these medicines showed that the patient improved I have reviewed home medications and made changes accordingly.  Critical Care Interventions: n/a Consultations: n/a Disposition: discharge  This is a supervised visit with my attending physician, Dr. Tinnie Gens. We have discussed this patient and they have altered the plan as needed.  Portions of this [name] were generated with Lobby[employer]. Dictation errors may occur despite best attempts at proofreading.    Final Clinical Impression(s) / ED Diagnoses Final diagnoses:  Nausea and vomiting, unspecified vomiting type  Right lower quadrant abdominal pain    Rx / DC Orders ED Discharge Orders          Ordered    polyethylene glycol (MIRALAX) 17 g packet  Daily        11/12/21 1131              Kimbria Camposano, Adora Fridge, PA-C 11/12/21 [place], MD 11/12/21 1316

## 2021-11-12 NOTE — ED Notes (Signed)
Pt states understanding of dc instructions, importance of follow up.  Pt denies questions or concerns upon dc. Pt declined wheelchair assistance upon dc. Pt ambulated out of ed w/ steady gait. No belongings left in room upon dc.  

## 2021-11-12 NOTE — ED Notes (Signed)
I gave patient some water and some peanut butter and crackers

## 2021-11-12 NOTE — ED Notes (Signed)
Patient has a urine culture in the main lab 

## 2021-11-12 NOTE — Progress Notes (Addendum)
Inpatient Diabetes Program Recommendations  AACE/ADA: New Consensus Statement on Inpatient Glycemic Control (2015)  Target Ranges:  Prepandial:   less than 140 mg/dL      Peak postprandial:   less than 180 mg/dL (1-2 hours)      Critically ill patients:  140 - 180 mg/dL   Lab Results  Component Value Date   GLUCAP 260 (H) 11/11/2021   HGBA1C 9.8 (H) 04/08/2019    Review of Glycemic Control  Latest Reference Range & Units 11/12/21 05:36  Glucose 70 - 99 mg/dL 248 (H)   Diabetes history: DM 2 Outpatient Diabetes medications:  Trulicity 6.00 mg weekly (Tuesdays) Lantus 30 units q HS  Inpatient Diabetes Program Recommendations:    Consider adding Novolog sensitive q 4 hours.    Thanks,  Adah Perl, RN, BC-ADM Inpatient Diabetes Coordinator Pager [phone]  (8a-5p)

## 2021-11-12 NOTE — Discharge Instructions (Signed)
Please start taking daily Miralax to restart your bowels. You may have some diarrhea with this which is to be expected.  Please return to the ED if unable to tolerate fluids or experiencing worsening symptoms. You can take Tylenol for your fevers as needed.  Schedule a follow up visit with your PCP

## 2021-11-14 ENCOUNTER — Emergency Department (HOSPITAL_COMMUNITY): Payer: 59

## 2021-11-14 ENCOUNTER — Emergency Department (HOSPITAL_COMMUNITY)
Admission: EM | Admit: 2021-11-14 | Discharge: 2021-11-14 | Disposition: A | Discharge status: Home / Self Care | Payer: 59 | Attending: Emergency Medicine | Admitting: Emergency Medicine

## 2021-11-14 ENCOUNTER — Other Ambulatory Visit: Payer: Self-pay

## 2021-11-14 ENCOUNTER — Encounter (HOSPITAL_COMMUNITY): Payer: Self-pay | Admitting: Emergency Medicine

## 2021-11-14 DIAGNOSIS — Z20822 Contact with and (suspected) exposure to covid-19: Secondary | ICD-10-CM | POA: Diagnosis not present

## 2021-11-14 DIAGNOSIS — I10 Essential (primary) hypertension: Secondary | ICD-10-CM | POA: Insufficient documentation

## 2021-11-14 DIAGNOSIS — R112 Nausea with vomiting, unspecified: Secondary | ICD-10-CM | POA: Diagnosis not present

## 2021-11-14 DIAGNOSIS — D72829 Elevated white blood cell count, unspecified: Secondary | ICD-10-CM | POA: Diagnosis not present

## 2021-11-14 DIAGNOSIS — Z79899 Other long term (current) drug therapy: Secondary | ICD-10-CM | POA: Insufficient documentation

## 2021-11-14 DIAGNOSIS — I252 Old myocardial infarction: Secondary | ICD-10-CM | POA: Diagnosis not present

## 2021-11-14 DIAGNOSIS — Z7982 Long term (current) use of aspirin: Secondary | ICD-10-CM | POA: Insufficient documentation

## 2021-11-14 DIAGNOSIS — E1165 Type 2 diabetes mellitus with hyperglycemia: Secondary | ICD-10-CM | POA: Diagnosis not present

## 2021-11-14 DIAGNOSIS — E1140 Type 2 diabetes mellitus with diabetic neuropathy, unspecified: Secondary | ICD-10-CM | POA: Diagnosis not present

## 2021-11-14 DIAGNOSIS — J449 Chronic obstructive pulmonary disease, unspecified: Secondary | ICD-10-CM | POA: Diagnosis not present

## 2021-11-14 DIAGNOSIS — J189 Pneumonia, unspecified organism: Secondary | ICD-10-CM | POA: Diagnosis not present

## 2021-11-14 DIAGNOSIS — D649 Anemia, unspecified: Secondary | ICD-10-CM | POA: Diagnosis not present

## 2021-11-14 DIAGNOSIS — R509 Fever, unspecified: Secondary | ICD-10-CM | POA: Diagnosis present

## 2021-11-14 LAB — CBC
HCT: 34.9 % — ABNORMAL LOW (ref 39.0–52.0)
Hemoglobin: 11.6 g/dL — ABNORMAL LOW (ref 13.0–17.0)
MCH: 28.4 pg (ref 26.0–34.0)
MCHC: 33.2 g/dL (ref 30.0–36.0)
MCV: 85.5 fL (ref 80.0–100.0)
Platelets: 452 10*3/uL — ABNORMAL HIGH (ref 150–400)
RBC: 4.08 MIL/uL — ABNORMAL LOW (ref 4.22–5.81)
RDW: 13.4 % (ref 11.5–15.5)
WBC: 12.2 10*3/uL — ABNORMAL HIGH (ref 4.0–10.5)
nRBC: 0 % (ref 0.0–0.2)

## 2021-11-14 LAB — RESPIRATORY PANEL BY PCR

## 2021-11-14 LAB — COMPREHENSIVE METABOLIC PANEL
ALT: 23 U/L (ref 0–44)
AST: 20 U/L (ref 15–41)
Albumin: 2.9 g/dL — ABNORMAL LOW (ref 3.5–5.0)
Alkaline Phosphatase: 93 U/L (ref 38–126)
Anion gap: 9 (ref 5–15)
BUN: 5 mg/dL — ABNORMAL LOW (ref 8–23)
CO2: 23 mmol/L (ref 22–32)
Calcium: 8.9 mg/dL (ref 8.9–10.3)
Chloride: 101 mmol/L (ref 98–111)
Creatinine, Ser: 0.98 mg/dL (ref 0.61–1.24)
GFR, Estimated: >60 mL/min (ref >60)
Glucose, Bld: 161 mg/dL — ABNORMAL HIGH (ref 70–99)
Potassium: 3.5 mmol/L (ref 3.5–5.1)
Sodium: 133 mmol/L — ABNORMAL LOW (ref 135–145)
Total Bilirubin: 0.8 mg/dL (ref 0.3–1.2)
Total Protein: 6.9 g/dL (ref 6.5–8.1)

## 2021-11-14 LAB — URINALYSIS, ROUTINE W REFLEX MICROSCOPIC
Bacteria, UA: NONE SEEN
Bilirubin Urine: NEGATIVE
Glucose, UA: NEGATIVE mg/dL
Hgb urine dipstick: NEGATIVE
Ketones, ur: 5 mg/dL — AB
Leukocytes,Ua: NEGATIVE
Nitrite: NEGATIVE
Protein, ur: 30 mg/dL — AB
Specific Gravity, Urine: 1.016 (ref 1.005–1.030)
pH: 7 (ref 5.0–8.0)

## 2021-11-14 LAB — CBG MONITORING, ED: Glucose-Capillary: 151 mg/dL — ABNORMAL HIGH (ref 70–99)

## 2021-11-14 LAB — LIPASE, BLOOD: Lipase: 63 U/L — ABNORMAL HIGH (ref 11–51)

## 2021-11-14 MED ORDER — AMOXICILLIN-POT CLAVULANATE 875-125 MG PO TABS
1.0000 | ORAL_TABLET | Freq: Once | ORAL | Status: AC
  Administered 2021-11-14: 1 via ORAL
  Filled 2021-11-14: qty 1

## 2021-11-14 MED ORDER — ONDANSETRON HCL 4 MG/2ML IJ SOLN
4.0000 mg | Freq: Once | INTRAMUSCULAR | Status: AC
  Administered 2021-11-14: 4 mg via INTRAVENOUS
  Filled 2021-11-14: qty 2

## 2021-11-14 MED ORDER — AMOXICILLIN-POT CLAVULANATE 875-125 MG PO TABS: 1.0000 | ORAL_TABLET | Freq: Two times a day (BID) | ORAL | 0 refills | Status: DC

## 2021-11-14 MED ORDER — DOXYCYCLINE HYCLATE 100 MG PO CAPS: 100.0000 mg | ORAL_CAPSULE | Freq: Two times a day (BID) | ORAL | 0 refills | Status: DC

## 2021-11-14 MED ORDER — DOXYCYCLINE HYCLATE 100 MG PO TABS
100.0000 mg | ORAL_TABLET | Freq: Once | ORAL | Status: AC
  Administered 2021-11-14: 100 mg via ORAL
  Filled 2021-11-14: qty 1

## 2021-11-14 MED ORDER — SODIUM CHLORIDE 0.9 % IV BOLUS
1000.0000 mL | Freq: Once | INTRAVENOUS | Status: AC
  Administered 2021-11-14: 1000 mL via INTRAVENOUS

## 2021-11-14 MED ORDER — ONDANSETRON 4 MG PO TBDP: 4.0000 mg | ORAL_TABLET | Freq: Three times a day (TID) | ORAL | 0 refills | Status: AC | PRN

## 2021-11-14 MED ORDER — ACETAMINOPHEN 325 MG PO TABS
650.0000 mg | ORAL_TABLET | Freq: Four times a day (QID) | ORAL | Status: DC | PRN
  Administered 2021-11-14: 650 mg via ORAL
  Filled 2021-11-14 (×2): qty 2

## 2021-11-14 NOTE — ED Notes (Signed)
Patient transported to X-ray 

## 2021-11-14 NOTE — ED Triage Notes (Signed)
Pt from home reports he continues to have a fever despite taking OTC.  He also reports he continues to have episodes of vomiting.  He was discharged from the hospital on Wednesday for SBO.

## 2021-11-14 NOTE — ED Provider Notes (Signed)
[facility] EMERGENCY DEPARTMENT Provider Note  CSN: [id number] Arrival date & time: 11/14/21 0349  Chief Complaint(s) Emesis and Fever  HPI Miguel [name] is a 61 y.o. male with a past medical history listed below including hypertension, hyperlipidemia, diabetes, recent admission for small bowel obstruction who developed fever during admission attributed to viral illness given his negative blood cultures, GI panel UA.  Since then the patient has been having persistent fevers.  He was evaluated on August 4 for the same and had negative work-up.  Patient reports that he has been having cough with nausea and nonbloody nonbilious emesis.  Denies any significant abdominal pain, but does report he has some soreness due to emesis.  Denies any urinary symptoms.  Denies any other physical complaints.  The history is provided by the patient.    Past Medical History Past Medical History:  Diagnosis Date   Anxiety    Arthritis    Depression    Diabetes mellitus without complication ([place])    Erectile dysfunction    Heart murmur    Hyperlipidemia    Hypertension    Myocardial infarction ([place]) 2003   PULMONARY NODULE 01/31/2007   Qualifier: Diagnosis of  By: June Leap     Sleep apnea    Patient Active Problem List   Diagnosis Date Noted   Leukocytosis 11/09/2021   Thrombocytosis 11/09/2021   SBO (small bowel obstruction) ([place]) 11/08/2021   Diabetes ([place]) 11/08/2021   Neuropathy of left superficial peroneal nerve 09/22/2021   Numbness of left foot 09/03/2021   S/P lumbar fusion 04/17/2019   Spinal stenosis of lumbar region 03/28/2019   Chronic pain syndrome 05/04/2017   Degeneration of lumbar intervertebral disc 05/04/2017   COPD (chronic obstructive pulmonary disease) ([place]) 11/21/2008   TOBACCO ABUSE 09/02/2008   Home Medication(s) Prior to Admission medications   Medication Sig Start Date End Date Taking? Authorizing Provider  amoxicillin-clavulanate  (AUGMENTIN) 875-125 MG tablet Take 1 tablet by mouth every 12 (twelve) hours. 11/14/21  Yes Ona Rathert, Grayce Sessions, MD  doxycycline (VIBRAMYCIN) 100 MG capsule Take 1 capsule (100 mg total) by mouth 2 (two) times daily for 7 days. 11/14/21 11/21/21 Yes Tesla Bochicchio, Grayce Sessions, MD  ondansetron (ZOFRAN-ODT) 4 MG disintegrating tablet Take 1 tablet (4 mg total) by mouth every 8 (eight) hours as needed for up to 3 days for nausea or vomiting. 11/14/21 11/17/21 Yes Krishana Lutze, Grayce Sessions, MD  ALEVE 220 MG tablet Take 220-440 mg by mouth 2 (two) times daily as needed (for pain).    [provider]  ASPIRIN 81 PO Take 81 mg by mouth See admin instructions. Take 81 mg by mouth every other night    [provider]  Dulaglutide (TRULICITY) 8.92 JJ/9.4RD SOPN Inject 0.75 mg into the skin every Tuesday.    [provider]  gabapentin (NEURONTIN) 600 MG tablet Take 600 mg by mouth 2 (two) times daily.    [provider]  Insulin Glargine (LANTUS SOLOSTAR) 100 UNIT/ML Solostar Pen Inject 30 Units into the skin at bedtime.    [provider]  lisinopril (ZESTRIL) 5 MG tablet Take 5 mg by mouth daily.    [provider]  Oxycodone HCl 20 MG TABS Take 20 mg by mouth 6 (six) times daily.    [provider]  polyethylene glycol (MIRALAX) 17 g packet Take 17 g by mouth daily for 14 days. 11/12/21 11/26/21  Loeffler, Adora Fridge, PA-C  sertraline (ZOLOFT) 100 MG tablet Take 100 mg  by mouth at bedtime.    [provider]  tadalafil (CIALIS) 20 MG tablet Take 20 mg by mouth daily as needed for erectile dysfunction.    [provider]  tiZANidine (ZANAFLEX) 4 MG tablet Take 4 mg by mouth every 8 (eight) hours as needed for muscle spasms.    [provider]  traZODone (DESYREL) 50 MG tablet Take 50 mg by mouth at bedtime.    [provider]                                                                                                                                     Allergies Cymbalta [duloxetine hcl], Statins, and Zolpidem  Review of Systems Review of Systems As noted in HPI  Physical Exam Vital Signs  I have reviewed the triage vital signs BP 115/61   Pulse (!) 56   Temp (!) 101.4 F (38.6 C) (Oral)   Resp 18   Ht 6' (1.829 m)   Wt 83 kg   SpO2 95%   BMI 24.82 kg/m   Physical Exam Vitals reviewed.  Constitutional:      General: He is not in acute distress.    Appearance: He is well-developed. He is not diaphoretic.  HENT:     Head: Normocephalic and atraumatic.     Nose: Nose normal.  Eyes:     General: No scleral icterus.       Right eye: No discharge.        Left eye: No discharge.     Conjunctiva/sclera: Conjunctivae normal.     Pupils: Pupils are equal, round, and reactive to light.  Cardiovascular:     Rate and Rhythm: Normal rate and regular rhythm.     Heart sounds: No murmur heard.    No friction rub. No gallop.  Pulmonary:     Effort: Pulmonary effort is normal. No respiratory distress.     Breath sounds: Normal breath sounds. No stridor. No rales.  Abdominal:     General: There is no distension.     Palpations: Abdomen is soft.     Tenderness: There is no abdominal tenderness.  Musculoskeletal:        General: No tenderness.     Cervical back: Normal range of motion and neck supple.  Skin:    General: Skin is warm and dry.     Findings: No erythema or rash.  Neurological:     Mental Status: He is alert and oriented to person, place, and time.     ED Results and Treatments Labs (all labs ordered are listed, but only abnormal results are displayed) Labs Reviewed  LIPASE, BLOOD - Abnormal; Notable for the following components:      Result Value   Lipase 63 (*)    All other components within normal limits  COMPREHENSIVE METABOLIC PANEL - Abnormal; Notable for the following components:   Sodium 133 (*)  Glucose, Bld 161 (*)    BUN 5 (*)    Albumin 2.9 (*)    All other  components within normal limits  CBC - Abnormal; Notable for the following components:   WBC 12.2 (*)    RBC 4.08 (*)    Hemoglobin 11.6 (*)    HCT 34.9 (*)    Platelets 452 (*)    All other components within normal limits  URINALYSIS, ROUTINE W REFLEX MICROSCOPIC - Abnormal; Notable for the following components:   Ketones, ur 5 (*)    Protein, ur 30 (*)    All other components within normal limits  CBG MONITORING, ED - Abnormal; Notable for the following components:   Glucose-Capillary 151 (*)    All other components within normal limits  RESPIRATORY PANEL BY PCR                                                                                                                         EKG  EKG Interpretation  Date/Time:    Ventricular Rate:    PR Interval:    QRS Duration:   QT Interval:    QTC Calculation:   R Axis:     Text Interpretation:         Radiology DG Chest 2 View  Result Date: 11/14/2021 CLINICAL DATA:  Fever and vomiting. EXAM: CHEST - 2 VIEW COMPARISON:  04/08/2019 FINDINGS: Heart size and mediastinal contours are unremarkable. No pleural effusion or edema. There is dense airspace consolidation within the anterior basal right upper lobe consistent with pneumonia. Left lung appears clear. Osseous structures appear intact. IMPRESSION: Right upper lobe pneumonia. Followup PA and lateral chest X-ray is recommended in 3-4 weeks following trial of antibiotic therapy to ensure resolution and exclude underlying malignancy. Electronically Signed   By: Kerby Moors M.D.   On: 11/14/2021 05:59    Pertinent labs & imaging results that were available during my care of the patient were reviewed by me and considered in my medical decision making (see MDM for details).  Medications Ordered in ED Medications  acetaminophen (TYLENOL) tablet 650 mg (650 mg Oral Given 11/14/21 0528)  sodium chloride 0.9 % bolus 1,000 mL (0 mLs Intravenous Stopped 11/14/21 0751)  ondansetron (ZOFRAN)  injection 4 mg (4 mg Intravenous Given 11/14/21 0528)  doxycycline (VIBRA-TABS) tablet 100 mg (100 mg Oral Given 11/14/21 0751)  amoxicillin-clavulanate (AUGMENTIN) 875-125 MG per tablet 1 tablet (1 tablet Oral Given 11/14/21 0751)  Procedures Procedures  (including critical care time)  Medical Decision Making / ED Course    Complexity of Problem:  Co-morbidities/SDOH that complicate the patient evaluation/care: Noted above in HPI  Additional history obtained: Admission noted above  Patient's presenting problem/concern, DDX, and MDM listed below: Persistent fever We will obtain screening labs, chest x-ray, UA and RVP  Hospitalization Considered:  Yes  Initial Intervention:  IV fluids and antiemetics    Complexity of Data:   Laboratory Tests ordered listed below with my independent interpretation: CBC with leukocytosis.  Mild anemia. No significant electrolyte derangements or renal sufficiency.  Mild hyperglycemia without evidence of DKA. UA without evidence of infection. RVP negative.   Imaging Studies ordered listed below with my independent interpretation: Chest x-ray notable for right upper lobe pneumonia     ED Course:    Assessment, Add'l Intervention, and Reassessment: Right upper lobe pneumonia Possible postviral. Patient is satting well on room air and is in no respiratory distress Able to tolerate oral intake Given the option of admission for IV antibiotics or DC home with oral meds and strict return precautions.  Patient opted for the latter.    Final Clinical Impression(s) / ED Diagnoses Final diagnoses:  Pneumonia of right upper lobe due to infectious organism  Nausea and vomiting in adult   The patient appears reasonably screened and/or stabilized for discharge and I doubt any other medical condition or other [facility] requiring  further screening, evaluation, or treatment in the ED at this time. I have discussed the findings, Dx and Tx plan with the patient/family who expressed understanding and agree(s) with the plan. Discharge instructions discussed at length. The patient/family was given strict return precautions who verbalized understanding of the instructions. No further questions at time of discharge.  Disposition: Discharge  Condition: Good  ED Discharge Orders          Ordered    ondansetron (ZOFRAN-ODT) 4 MG disintegrating tablet  Every 8 hours PRN        11/14/21 0722    doxycycline (VIBRAMYCIN) 100 MG capsule  2 times daily        11/14/21 0722    amoxicillin-clavulanate (AUGMENTIN) 875-125 MG tablet  Every 12 hours        11/14/21 0722            Follow Up: Rory Percy, MD [place] [address] [phone]  Call  to schedule an appointment for close follow up           This chart was dictated using voice recognition software.  Despite best efforts to proofread,  errors can occur which can change the documentation meaning.    Fatima Blank, MD 11/14/21 [phone]

## 2021-11-15 LAB — CULTURE, BLOOD (ROUTINE X 2)
Culture: NO GROWTH
Culture: NO GROWTH
Special Requests: ADEQUATE
Special Requests: ADEQUATE

## 2021-11-19 ENCOUNTER — Observation Stay (HOSPITAL_COMMUNITY)
Admission: EM | Admit: 2021-11-19 | Discharge: 2021-11-21 | Disposition: A | Discharge status: Home / Self Care | Payer: 59 | Attending: Internal Medicine | Admitting: Internal Medicine

## 2021-11-19 ENCOUNTER — Other Ambulatory Visit: Payer: Self-pay

## 2021-11-19 ENCOUNTER — Encounter (HOSPITAL_COMMUNITY): Payer: Self-pay | Admitting: Family Medicine

## 2021-11-19 ENCOUNTER — Emergency Department (HOSPITAL_COMMUNITY): Payer: 59

## 2021-11-19 DIAGNOSIS — Z794 Long term (current) use of insulin: Secondary | ICD-10-CM | POA: Insufficient documentation

## 2021-11-19 DIAGNOSIS — G894 Chronic pain syndrome: Secondary | ICD-10-CM | POA: Diagnosis present

## 2021-11-19 DIAGNOSIS — Z7985 Long-term (current) use of injectable non-insulin antidiabetic drugs: Secondary | ICD-10-CM

## 2021-11-19 DIAGNOSIS — Z7982 Long term (current) use of aspirin: Secondary | ICD-10-CM

## 2021-11-19 DIAGNOSIS — Z87891 Personal history of nicotine dependence: Secondary | ICD-10-CM

## 2021-11-19 DIAGNOSIS — Z20822 Contact with and (suspected) exposure to covid-19: Secondary | ICD-10-CM | POA: Diagnosis present

## 2021-11-19 DIAGNOSIS — E119 Type 2 diabetes mellitus without complications: Secondary | ICD-10-CM

## 2021-11-19 DIAGNOSIS — Z888 Allergy status to other drugs, medicaments and biological substances status: Secondary | ICD-10-CM

## 2021-11-19 DIAGNOSIS — F32A Depression, unspecified: Secondary | ICD-10-CM | POA: Diagnosis present

## 2021-11-19 DIAGNOSIS — R0602 Shortness of breath: Secondary | ICD-10-CM | POA: Insufficient documentation

## 2021-11-19 DIAGNOSIS — Z79899 Other long term (current) drug therapy: Secondary | ICD-10-CM | POA: Insufficient documentation

## 2021-11-19 DIAGNOSIS — N179 Acute kidney failure, unspecified: Secondary | ICD-10-CM

## 2021-11-19 DIAGNOSIS — E1140 Type 2 diabetes mellitus with diabetic neuropathy, unspecified: Secondary | ICD-10-CM

## 2021-11-19 DIAGNOSIS — E785 Hyperlipidemia, unspecified: Secondary | ICD-10-CM | POA: Diagnosis present

## 2021-11-19 DIAGNOSIS — R079 Chest pain, unspecified: Secondary | ICD-10-CM | POA: Diagnosis present

## 2021-11-19 DIAGNOSIS — J189 Pneumonia, unspecified organism: Secondary | ICD-10-CM | POA: Diagnosis not present

## 2021-11-19 DIAGNOSIS — J449 Chronic obstructive pulmonary disease, unspecified: Secondary | ICD-10-CM | POA: Diagnosis not present

## 2021-11-19 DIAGNOSIS — R091 Pleurisy: Secondary | ICD-10-CM

## 2021-11-19 DIAGNOSIS — M549 Dorsalgia, unspecified: Secondary | ICD-10-CM | POA: Diagnosis present

## 2021-11-19 DIAGNOSIS — F419 Anxiety disorder, unspecified: Secondary | ICD-10-CM | POA: Diagnosis present

## 2021-11-19 DIAGNOSIS — I252 Old myocardial infarction: Secondary | ICD-10-CM

## 2021-11-19 DIAGNOSIS — I10 Essential (primary) hypertension: Secondary | ICD-10-CM | POA: Diagnosis present

## 2021-11-19 DIAGNOSIS — J440 Chronic obstructive pulmonary disease with acute lower respiratory infection: Secondary | ICD-10-CM | POA: Diagnosis present

## 2021-11-19 LAB — CBC WITH DIFFERENTIAL/PLATELET
Abs Immature Granulocytes: 0.09 10*3/uL — ABNORMAL HIGH (ref 0.00–0.07)
Basophils Absolute: 0 10*3/uL (ref 0.0–0.1)
Basophils Relative: 0 %
Eosinophils Absolute: 0.1 10*3/uL (ref 0.0–0.5)
Eosinophils Relative: 1 %
HCT: 39.4 % (ref 39.0–52.0)
Hemoglobin: 12.4 g/dL — ABNORMAL LOW (ref 13.0–17.0)
Immature Granulocytes: 1 %
Lymphocytes Relative: 14 %
Lymphs Abs: 1.5 10*3/uL (ref 0.7–4.0)
MCH: 27.6 pg (ref 26.0–34.0)
MCHC: 31.5 g/dL (ref 30.0–36.0)
MCV: 87.8 fL (ref 80.0–100.0)
Monocytes Absolute: 0.5 10*3/uL (ref 0.1–1.0)
Monocytes Relative: 5 %
Neutro Abs: 8.2 10*3/uL — ABNORMAL HIGH (ref 1.7–7.7)
Neutrophils Relative %: 79 %
Platelets: 544 10*3/uL — ABNORMAL HIGH (ref 150–400)
RBC: 4.49 MIL/uL (ref 4.22–5.81)
RDW: 13.8 % (ref 11.5–15.5)
WBC: 10.4 10*3/uL (ref 4.0–10.5)
nRBC: 0 % (ref 0.0–0.2)

## 2021-11-19 LAB — BASIC METABOLIC PANEL
Anion gap: 10 (ref 5–15)
BUN: 12 mg/dL (ref 8–23)
CO2: 24 mmol/L (ref 22–32)
Calcium: 9.3 mg/dL (ref 8.9–10.3)
Chloride: 98 mmol/L (ref 98–111)
Creatinine, Ser: 1.12 mg/dL (ref 0.61–1.24)
GFR, Estimated: >60 mL/min (ref >60)
Glucose, Bld: 277 mg/dL — ABNORMAL HIGH (ref 70–99)
Potassium: 4.1 mmol/L (ref 3.5–5.1)
Sodium: 132 mmol/L — ABNORMAL LOW (ref 135–145)

## 2021-11-19 MED ORDER — TRAZODONE HCL 50 MG PO TABS
50.0000 mg | ORAL_TABLET | Freq: Every day | ORAL | Status: DC
  Administered 2021-11-20 (×2): 50 mg via ORAL
  Filled 2021-11-19 (×2): qty 1

## 2021-11-19 MED ORDER — ENOXAPARIN SODIUM 40 MG/0.4ML IJ SOSY
40.0000 mg | PREFILLED_SYRINGE | INTRAMUSCULAR | Status: DC
  Administered 2021-11-20 – 2021-11-21 (×2): 40 mg via SUBCUTANEOUS
  Filled 2021-11-19 (×2): qty 0.4

## 2021-11-19 MED ORDER — LISINOPRIL 10 MG PO TABS
5.0000 mg | ORAL_TABLET | Freq: Every day | ORAL | Status: DC
  Administered 2021-11-20 – 2021-11-21 (×2): 5 mg via ORAL
  Filled 2021-11-19 (×2): qty 1

## 2021-11-19 MED ORDER — ONDANSETRON HCL 4 MG/2ML IJ SOLN
4.0000 mg | Freq: Once | INTRAMUSCULAR | Status: AC
  Administered 2021-11-19: 4 mg via INTRAVENOUS
  Filled 2021-11-19: qty 2

## 2021-11-19 MED ORDER — INSULIN GLARGINE-YFGN 100 UNIT/ML ~~LOC~~ SOLN
20.0000 [IU] | Freq: Every day | SUBCUTANEOUS | Status: DC
  Administered 2021-11-20 (×2): 20 [IU] via SUBCUTANEOUS
  Filled 2021-11-19 (×3): qty 0.2

## 2021-11-19 MED ORDER — HYDROMORPHONE HCL 1 MG/ML IJ SOLN
1.0000 mg | INTRAMUSCULAR | Status: DC | PRN
  Administered 2021-11-20 (×2): 1 mg via INTRAVENOUS
  Filled 2021-11-19 (×2): qty 1

## 2021-11-19 MED ORDER — SODIUM CHLORIDE 0.9 % IV SOLN
2.0000 g | INTRAVENOUS | Status: DC
  Administered 2021-11-20: 2 g via INTRAVENOUS
  Filled 2021-11-19: qty 20

## 2021-11-19 MED ORDER — ASPIRIN 81 MG PO TBEC
81.0000 mg | DELAYED_RELEASE_TABLET | ORAL | Status: DC
  Administered 2021-11-20: 81 mg via ORAL
  Filled 2021-11-19 (×2): qty 1

## 2021-11-19 MED ORDER — ACETAMINOPHEN 325 MG PO TABS: 650.0000 mg | ORAL_TABLET | Freq: Four times a day (QID) | ORAL | Status: DC | PRN

## 2021-11-19 MED ORDER — SERTRALINE HCL 100 MG PO TABS
100.0000 mg | ORAL_TABLET | Freq: Every day | ORAL | Status: DC
  Administered 2021-11-20 (×2): 100 mg via ORAL
  Filled 2021-11-19 (×2): qty 1

## 2021-11-19 MED ORDER — IOHEXOL 350 MG/ML SOLN
60.0000 mL | Freq: Once | INTRAVENOUS | Status: AC | PRN
  Administered 2021-11-19: 60 mL via INTRAVENOUS

## 2021-11-19 MED ORDER — POLYETHYLENE GLYCOL 3350 17 G PO PACK
17.0000 g | PACK | Freq: Every day | ORAL | Status: DC | PRN
  Administered 2021-11-20 – 2021-11-21 (×2): 17 g via ORAL
  Filled 2021-11-19 (×2): qty 1

## 2021-11-19 MED ORDER — LEVOFLOXACIN IN D5W 750 MG/150ML IV SOLN
750.0000 mg | Freq: Once | INTRAVENOUS | Status: AC
  Administered 2021-11-19: 750 mg via INTRAVENOUS
  Filled 2021-11-19: qty 150

## 2021-11-19 MED ORDER — ALBUTEROL SULFATE (2.5 MG/3ML) 0.083% IN NEBU
3.0000 mL | INHALATION_SOLUTION | RESPIRATORY_TRACT | Status: DC | PRN
Start: 2021-11-19 — End: 2021-11-21

## 2021-11-19 MED ORDER — ONDANSETRON HCL 4 MG/2ML IJ SOLN
4.0000 mg | Freq: Four times a day (QID) | INTRAMUSCULAR | Status: DC | PRN
  Administered 2021-11-20 (×2): 4 mg via INTRAVENOUS
  Filled 2021-11-19 (×2): qty 2

## 2021-11-19 MED ORDER — AZITHROMYCIN 500 MG PO TABS
500.0000 mg | ORAL_TABLET | Freq: Every day | ORAL | Status: DC
  Administered 2021-11-20: 500 mg via ORAL
  Filled 2021-11-19 (×2): qty 1

## 2021-11-19 MED ORDER — LEVOFLOXACIN IN D5W 500 MG/100ML IV SOLN: 500.0000 mg | Freq: Once | INTRAVENOUS | Status: DC

## 2021-11-19 MED ORDER — GABAPENTIN 600 MG PO TABS
600.0000 mg | ORAL_TABLET | Freq: Two times a day (BID) | ORAL | Status: DC
  Administered 2021-11-20 – 2021-11-21 (×4): 600 mg via ORAL
  Filled 2021-11-19 (×4): qty 1

## 2021-11-19 MED ORDER — INSULIN ASPART 100 UNIT/ML IJ SOLN: 0.0000 [IU] | Freq: Every day | INTRAMUSCULAR | Status: DC

## 2021-11-19 MED ORDER — ACETAMINOPHEN 500 MG PO TABS
1000.0000 mg | ORAL_TABLET | Freq: Once | ORAL | Status: AC
Start: 2021-11-19 — End: 2021-11-19
  Administered 2021-11-19: 1000 mg via ORAL
  Filled 2021-11-19: qty 2

## 2021-11-19 MED ORDER — BISACODYL 5 MG PO TBEC
5.0000 mg | DELAYED_RELEASE_TABLET | Freq: Every day | ORAL | Status: DC | PRN
  Administered 2021-11-20: 5 mg via ORAL
  Filled 2021-11-19: qty 1

## 2021-11-19 MED ORDER — SODIUM CHLORIDE 0.9 % IV SOLN
2.0000 g | Freq: Once | INTRAVENOUS | Status: AC
  Administered 2021-11-19: 2 g via INTRAVENOUS
  Filled 2021-11-19: qty 20

## 2021-11-19 MED ORDER — TIZANIDINE HCL 4 MG PO TABS
4.0000 mg | ORAL_TABLET | Freq: Three times a day (TID) | ORAL | Status: DC | PRN
  Administered 2021-11-20: 4 mg via ORAL
  Filled 2021-11-19: qty 1

## 2021-11-19 MED ORDER — SODIUM CHLORIDE 0.9 % IV SOLN
500.0000 mg | Freq: Once | INTRAVENOUS | Status: AC
  Administered 2021-11-19: 500 mg via INTRAVENOUS
  Filled 2021-11-19: qty 5

## 2021-11-19 MED ORDER — OXYCODONE HCL 5 MG PO TABS
20.0000 mg | ORAL_TABLET | ORAL | Status: DC | PRN
  Administered 2021-11-20 – 2021-11-21 (×2): 20 mg via ORAL
  Filled 2021-11-19 (×2): qty 4

## 2021-11-19 MED ORDER — INSULIN ASPART 100 UNIT/ML IJ SOLN
0.0000 [IU] | Freq: Three times a day (TID) | INTRAMUSCULAR | Status: DC
  Administered 2021-11-20: 1 [IU] via SUBCUTANEOUS
  Administered 2021-11-20: 2 [IU] via SUBCUTANEOUS
  Administered 2021-11-20: 1 [IU] via SUBCUTANEOUS
  Administered 2021-11-21: 2 [IU] via SUBCUTANEOUS

## 2021-11-19 NOTE — ED Provider Triage Note (Signed)
Emergency Medicine Provider Triage Evaluation Note  Miguel [name] , a 61 y.o. male  was evaluated in triage.  Pt complains of pneumonia.  Patient reports that he was seen on 8/6 and diagnosed with pneumonia.  Patient was placed on amoxicillin, azithromycin.  Patient followed up with his PCP 2 days later and was placed on Levaquin.  Patient states that he was seen by his PCP today and directed to present to ED for IV antibiotics and admission.  Patient states PCP is concerned about growing pneumonia.  Patient reports increased nausea and vomiting, denies any fevers however does endorse body aches and chills.  Denies cough.  Review of Systems  Positive:  Negative:   Physical Exam  BP 129/79   Pulse 70   Temp 98.3 F (36.8 C) (Oral)   Resp (!) 28   SpO2 98%  Gen:   Awake, no distress   Resp:  Normal effort  MSK:   Moves extremities without difficulty  Other:    Medical Decision Making  Medically screening exam initiated at 4:09 PM.  Appropriate orders placed.  Graham Hyun Podoll was informed that the remainder of the evaluation will be completed by another provider, this initial triage assessment does not replace that evaluation, and the importance of remaining in the ED until their evaluation is complete.     Azucena Cecil, PA-C 11/19/21 1610

## 2021-11-19 NOTE — ED Notes (Signed)
Pt actively vomiting at this time. Dr. Myna Hidalgo made aware of same received order for Zofran '4mg'$ 

## 2021-11-19 NOTE — ED Provider Notes (Incomplete)
[facility] EMERGENCY DEPARTMENT Provider Note   CSN: [id number] Arrival date & time: 11/19/21  1527     History {Add pertinent medical, surgical, social history, OB history to HPI:1} Chief Complaint  Patient presents with  . Shortness of Breath  . Chest Pain    Miguel [name] is a 61 y.o. male.  HPI     Home Medications Prior to Admission medications   Medication Sig Start Date End Date Taking? Authorizing Provider  ALEVE 220 MG tablet Take 220-440 mg by mouth 2 (two) times daily as needed (for pain).    [provider]  amoxicillin-clavulanate (AUGMENTIN) 875-125 MG tablet Take 1 tablet by mouth every 12 (twelve) hours. 11/14/21   Fatima Blank, MD  ASPIRIN 81 PO Take 81 mg by mouth See admin instructions. Take 81 mg by mouth every other night    [provider]  doxycycline (VIBRAMYCIN) 100 MG capsule Take 1 capsule (100 mg total) by mouth 2 (two) times daily for 7 days. 11/14/21 11/21/21  Fatima Blank, MD  Dulaglutide (TRULICITY) 8.34 HD/6.2IW SOPN Inject 0.75 mg into the skin every Tuesday.    [provider]  gabapentin (NEURONTIN) 600 MG tablet Take 600 mg by mouth 2 (two) times daily.    [provider]  Insulin Glargine (LANTUS SOLOSTAR) 100 UNIT/ML Solostar Pen Inject 30 Units into the skin at bedtime.    [provider]  lisinopril (ZESTRIL) 5 MG tablet Take 5 mg by mouth daily.    [provider]  Oxycodone HCl 20 MG TABS Take 20 mg by mouth 6 (six) times daily.    [provider]  polyethylene glycol (MIRALAX) 17 g packet Take 17 g by mouth daily for 14 days. 11/12/21 11/26/21  Loeffler, Adora Fridge, PA-C  sertraline (ZOLOFT) 100 MG tablet Take 100 mg by mouth at bedtime.    [provider]  tadalafil (CIALIS) 20 MG tablet Take 20 mg by mouth daily as needed for erectile dysfunction.    [provider]  tiZANidine (ZANAFLEX) 4 MG tablet Take 4 mg by mouth every  8 (eight) hours as needed for muscle spasms.    [provider]  traZODone (DESYREL) 50 MG tablet Take 50 mg by mouth at bedtime.    [provider]      Allergies    Cymbalta [duloxetine hcl], Statins, and Zolpidem    Review of Systems   Review of Systems  Physical Exam Updated Vital Signs BP 127/71 (BP Location: Right Arm)   Pulse 63   Temp 98.7 F (37.1 C) (Oral)   Resp 14   SpO2 97%  Physical Exam  ED Results / Procedures / Treatments   Labs (all labs ordered are listed, but only abnormal results are displayed) Labs Reviewed  BASIC METABOLIC PANEL - Abnormal; Notable for the following components:      Result Value   Sodium 132 (*)    Glucose, Bld 277 (*)    All other components within normal limits  CBC WITH DIFFERENTIAL/PLATELET - Abnormal; Notable for the following components:   Hemoglobin 12.4 (*)    Platelets 544 (*)    Neutro Abs 8.2 (*)    Abs Immature Granulocytes 0.09 (*)    All other components within normal limits    EKG EKG Interpretation  Date/Time:  Friday November 19 2021 16:17:53 EDT Ventricular Rate:  66 PR Interval:  142 QRS Duration: 88 QT Interval:  428 QTC Calculation: 448 R  Axis:   42 Text Interpretation: Normal sinus rhythm Possible Inferior infarct , age undetermined Abnormal ECG When compared with ECG of 19-Nov-2021 16:05, No significant change since last tracing Confirmed by Pattricia Boss [phone]) on 11/19/2021 4:35:51 PM  Radiology DG Chest 2 View  Result Date: 11/19/2021 CLINICAL DATA:  Shortness of breath. Right-sided chest pain. Cough. Recent diagnosis 5 days ago of right-sided pneumonia. EXAM: CHEST - 2 VIEW COMPARISON:  Chest two views 11/19/2021, 11/14/2021, and 04/08/2019; CT chest 09/03/2008 FINDINGS: Redemonstration of dense airspace consolidation within the anterior inferior aspect of the right upper lobe again consistent with pneumonia, as seen on 11/19/2021 radiograph earlier today and 11/14/2021 prior  radiograph. Mild left basilar linear subsegmental atelectasis versus scarring no pleural effusion or pneumothorax. Cardiac silhouette and mediastinal contours are within normal limits. Mild-to-moderate multilevel degenerative disc changes of the thoracic spine. Minimal levocurvature of the mid to lower thoracic spine. IMPRESSION: Redemonstration of anterior inferior right upper lobe pneumonia. This is unchanged from radiograph earlier today and slightly more dense compared to 11/14/2021 radiographs. Electronically Signed   By: Yvonne Kendall M.D.   On: 11/19/2021 16:58    Procedures Procedures  {Document cardiac monitor, telemetry assessment procedure when appropriate:1}  Medications Ordered in ED Medications - No data to display  ED Course/ Medical Decision Making/ A&P                           Medical Decision Making  ***  {Document critical care time when appropriate:1} {Document review of labs and clinical decision tools ie heart score, Chads2Vasc2 etc:1}  {Document your independent review of radiology images, and any outside records:1} {Document your discussion with family members, caretakers, and with consultants:1} {Document social determinants of health affecting pt's care:1} {Document your decision making why or why not admission, treatments were needed:1} Final Clinical Impression(s) / ED Diagnoses Final diagnoses:  None    Rx / DC Orders ED Discharge Orders     None

## 2021-11-19 NOTE — H&P (Signed)
History and Physical    REA RESER WGN:[id number] DOB: 01-03-1961 DOA: 11/19/2021  PCP: Rory Percy, MD   Patient coming from: Home   Chief Complaint: Pleuritic pain on right   HPI: Miguel [name] is a pleasant 61 y.o. male with medical history significant for type 2 diabetes mellitus, hypertension, depression, anxiety, chronic back pain, recent gastroenteritis, and recent pneumonia diagnosis who presents with severe pleuritic pain on the right.  Patient was seen in the emergency department on 11/14/2021 with productive cough, fevers, and vomiting.  He was found to have a pneumonia and discharged home with doxycycline and Augmentin.  He saw his PCP on 11/17/2021 with persistent symptoms and was given Levaquin.  Since then, he is no longer having any fevers or chills, continues to have some sputum production, but has developed severe right-sided pleuritic pain.  He denies leg swelling or tenderness, denies hemoptysis, and reports that his recent diarrhea resolved.  He no longer smokes.  ED Course: Upon arrival to the ED, patient is found to be afebrile and saturating well on room air with stable blood pressure.  Chest x-ray notable for right upper lobe pneumonia which appeared slightly more dense than on 11/14/2021.  CTA chest is negative for PE but notable for right upper lobe consolidation.  Blood work notable for resolution of his leukocytosis, glucose 277, and platelets 544,000.  Patient was treated with Rocephin and azithromycin in the ED.  Review of Systems:  All other systems reviewed and apart from HPI, are negative.  Past Medical History:  Diagnosis Date   Anxiety    Arthritis    Depression    Diabetes mellitus without complication ([place])    Erectile dysfunction    Heart murmur    Hyperlipidemia    Hypertension    Myocardial infarction [facility]) 2003   PULMONARY NODULE 01/31/2007   Qualifier: Diagnosis of  By: June Leap     Sleep apnea     Past Surgical History:  Procedure  Laterality Date   BACK SURGERY     COLONOSCOPY     GASTROCNEMIUS RECESSION Left 06/17/2021   Procedure: Left gastroc recession;  Surgeon: Wylene Simmer, MD;  Location: [place];  Service: Orthopedics;  Laterality: Left;   HAND SURGERY Right    LAMINECTOMY  07/2001   PLANTAR FASCIA RELEASE Left 06/17/2021   Procedure: Plantar fascia release;  Surgeon: Wylene Simmer, MD;  Location: [place];  Service: Orthopedics;  Laterality: Left;   SHOULDER ARTHROSCOPY WITH ROTATOR CUFF REPAIR AND SUBACROMIAL DECOMPRESSION Left 07/07/2017   Procedure: Left shoulder arthroscopy, subacromial decompression, mini open rotator cuff repair;  Surgeon: Susa Day, MD;  Location: [place];  Service: Orthopedics;  Laterality: Left;    Social History:   reports that he has quit smoking. His smoking use included cigarettes. He smoked an average of .75 packs per day. He has never used smokeless tobacco. He reports that he does not currently use alcohol. He reports that he does not use drugs.  Allergies  Allergen Reactions   Cymbalta [Duloxetine Hcl] Other (See Comments)    Agitation    Statins Other (See Comments)    Extreme fatigue, myalgias (lipitor, zocor, crestor)   Zolpidem Other (See Comments)    Made the patient sleep walk    Family History  Problem Relation Age of Onset   AAA (abdominal aortic aneurysm) Father      Prior to Admission medications   Medication Sig Start Date End Date Taking? Authorizing  Provider  ALEVE 220 MG tablet Take 220-440 mg by mouth 2 (two) times daily as needed (for pain).    [provider]  amoxicillin-clavulanate (AUGMENTIN) 875-125 MG tablet Take 1 tablet by mouth every 12 (twelve) hours. 11/14/21   Fatima Blank, MD  ASPIRIN 81 PO Take 81 mg by mouth See admin instructions. Take 81 mg by mouth every other night    [provider]  doxycycline (VIBRAMYCIN) 100 MG capsule Take 1 capsule (100 mg total) by mouth 2  (two) times daily for 7 days. 11/14/21 11/21/21  Fatima Blank, MD  Dulaglutide (TRULICITY) 2.54 YH/0.6CB SOPN Inject 0.75 mg into the skin every Tuesday.    [provider]  gabapentin (NEURONTIN) 600 MG tablet Take 600 mg by mouth 2 (two) times daily.    [provider]  Insulin Glargine (LANTUS SOLOSTAR) 100 UNIT/ML Solostar Pen Inject 30 Units into the skin at bedtime.    [provider]  lisinopril (ZESTRIL) 5 MG tablet Take 5 mg by mouth daily.    [provider]  Oxycodone HCl 20 MG TABS Take 20 mg by mouth 6 (six) times daily.    [provider]  polyethylene glycol (MIRALAX) 17 g packet Take 17 g by mouth daily for 14 days. 11/12/21 11/26/21  Loeffler, Adora Fridge, PA-C  sertraline (ZOLOFT) 100 MG tablet Take 100 mg by mouth at bedtime.    [provider]  tadalafil (CIALIS) 20 MG tablet Take 20 mg by mouth daily as needed for erectile dysfunction.    [provider]  tiZANidine (ZANAFLEX) 4 MG tablet Take 4 mg by mouth every 8 (eight) hours as needed for muscle spasms.    [provider]  traZODone (DESYREL) 50 MG tablet Take 50 mg by mouth at bedtime.    [provider]    Physical Exam: Vitals:   11/19/21 1922 11/19/21 2000 11/19/21 2030 11/19/21 2104  BP:  (!) 140/77 (!) 140/77   Pulse: 60 (!) 52 (!) 36   Resp: '15 13 13   '$ Temp:    98.7 F (37.1 C)  TempSrc:    Oral  SpO2: 97% 97% 95%   Weight:    83 kg  Height:    6' (1.829 m)    Constitutional: NAD, calm  Eyes: PERTLA, lids and conjunctivae normal ENMT: Mucous membranes are moist. Posterior pharynx clear of any exudate or lesions.   Neck: supple, no masses  Respiratory: Rhonchi on right, no wheezing. No accessory muscle use.  Cardiovascular: S1 & S2 heard, regular rate and rhythm. No extremity edema.  Abdomen: No distension, no tenderness, soft. Bowel sounds active.  Musculoskeletal: no clubbing / cyanosis. No joint deformity upper and  lower extremities.   Skin: no significant rashes, lesions, ulcers. Warm, dry, well-perfused. Neurologic: CN 2-12 grossly intact. Moving all extremities. Alert and oriented.  Psychiatric: Pleasant. Cooperative.    Labs and Imaging on Admission: I have personally reviewed following labs and imaging studies  CBC: Recent Labs  Lab 11/14/21 0407 11/19/21 1616  WBC 12.2* 10.4  NEUTROABS  --  8.2*  HGB 11.6* 12.4*  HCT 34.9* 39.4  MCV 85.5 87.8  PLT 452* 762*   Basic Metabolic Panel: Recent Labs  Lab 11/14/21 0407 11/19/21 1616  NA 133* 132*  K 3.5 4.1  CL 101 98  CO2 23 24  GLUCOSE 161* 277*  BUN 5* 12  CREATININE 0.98 1.12  CALCIUM 8.9 9.3   GFR: Estimated Creatinine Clearance: 75.1  mL/min (by C-G formula based on SCr of 1.12 mg/dL). Liver Function Tests: Recent Labs  Lab 11/14/21 0407  AST 20  ALT 23  ALKPHOS 93  BILITOT 0.8  PROT 6.9  ALBUMIN 2.9*   Recent Labs  Lab 11/14/21 0407  LIPASE 63*   No results for input(s): "AMMONIA" in the last 168 hours. Coagulation Profile: No results for input(s): "INR", "PROTIME" in the last 168 hours. Cardiac Enzymes: No results for input(s): "CKTOTAL", "CKMB", "CKMBINDEX", "TROPONINI" in the last 168 hours. BNP (last 3 results) No results for input(s): "PROBNP" in the last 8760 hours. HbA1C: No results for input(s): "HGBA1C" in the last 72 hours. CBG: Recent Labs  Lab 11/14/21 0409  GLUCAP 151*   Lipid Profile: No results for input(s): "CHOL", "HDL", "LDLCALC", "TRIG", "CHOLHDL", "LDLDIRECT" in the last 72 hours. Thyroid Function Tests: No results for input(s): "TSH", "T4TOTAL", "FREET4", "T3FREE", "THYROIDAB" in the last 72 hours. Anemia Panel: No results for input(s): "VITAMINB12", "FOLATE", "FERRITIN", "TIBC", "IRON", "RETICCTPCT" in the last 72 hours. Urine analysis:    Component Value Date/Time   COLORURINE YELLOW 11/14/2021 0401   APPEARANCEUR CLEAR 11/14/2021 0401   LABSPEC 1.016 11/14/2021 0401    PHURINE 7.0 11/14/2021 0401   GLUCOSEU NEGATIVE 11/14/2021 0401   HGBUR NEGATIVE 11/14/2021 0401   BILIRUBINUR NEGATIVE 11/14/2021 0401   KETONESUR 5 (A) 11/14/2021 0401   PROTEINUR 30 (A) 11/14/2021 0401   NITRITE NEGATIVE 11/14/2021 0401   LEUKOCYTESUR NEGATIVE 11/14/2021 0401   Sepsis Labs: '@LABRCNTIP'$ (procalcitonin:4,lacticidven:4) ) Recent Results (from the past 240 hour(s))  Gastrointestinal Panel by PCR , Stool     Status: None   Collection Time: 11/10/21 11:09 AM   Specimen: Urine, Clean Catch; Stool  Result Value Ref Range Status   Campylobacter species NOT DETECTED NOT DETECTED Final   Plesimonas shigelloides NOT DETECTED NOT DETECTED Final   Salmonella species NOT DETECTED NOT DETECTED Final   Yersinia enterocolitica NOT DETECTED NOT DETECTED Final   Vibrio species NOT DETECTED NOT DETECTED Final   Vibrio cholerae NOT DETECTED NOT DETECTED Final   Enteroaggregative E coli (EAEC) NOT DETECTED NOT DETECTED Final   Enteropathogenic E coli (EPEC) NOT DETECTED NOT DETECTED Final   Enterotoxigenic E coli (ETEC) NOT DETECTED NOT DETECTED Final   Shiga like toxin producing E coli (STEC) NOT DETECTED NOT DETECTED Final   Shigella/Enteroinvasive E coli (EIEC) NOT DETECTED NOT DETECTED Final   Cryptosporidium NOT DETECTED NOT DETECTED Final   Cyclospora cayetanensis NOT DETECTED NOT DETECTED Final   Entamoeba histolytica NOT DETECTED NOT DETECTED Final   Giardia lamblia NOT DETECTED NOT DETECTED Final   Adenovirus F40/41 NOT DETECTED NOT DETECTED Final   Astrovirus NOT DETECTED NOT DETECTED Final   Norovirus GI/GII NOT DETECTED NOT DETECTED Final   Rotavirus A NOT DETECTED NOT DETECTED Final   Sapovirus (I, II, IV, and V) NOT DETECTED NOT DETECTED Final    Comment: Performed at [facility], [address] [place], [place] [address]  Culture, blood (Routine X 2) w Reflex to ID Panel     Status: None   Collection Time: 11/10/21 11:30 AM   Specimen: BLOOD  Result  Value Ref Range Status   Specimen Description   Final    BLOOD LEFT ANTECUBITAL Performed at [facility], [place] [address], [place], [place] [address]    Special Requests   Final    BOTTLES DRAWN AEROBIC ONLY Blood Culture adequate volume Performed at [facility] [address]  [place], [place] [address]    Culture   Final    NO GROWTH 5 DAYS Performed at [facility], [place] [address], [place], [place] [address]    Report Status 11/15/2021 FINAL  Final  Culture, blood (Routine X 2) w Reflex to ID Panel     Status: None   Collection Time: 11/10/21 11:32 AM   Specimen: BLOOD  Result Value Ref Range Status   Specimen Description   Final    BLOOD BLOOD LEFT FOREARM Performed at [place] [address] [place], [place] [address]    Special Requests   Final    BOTTLES DRAWN AEROBIC ONLY Blood Culture adequate volume Performed at [place] [address], [place], [place] [address]    Culture   Final    NO GROWTH 5 DAYS Performed at [facility], [place] [address] [place], [place] [address]    Report Status 11/15/2021 FINAL  Final  Respiratory (~20 pathogens) panel by PCR     Status: None   Collection Time: 11/14/21  5:52 AM   Specimen: Nasopharyngeal Swab; Respiratory  Result Value Ref Range Status   Adenovirus NOT DETECTED NOT DETECTED Final   Coronavirus 229E NOT DETECTED NOT DETECTED Final    Comment: (NOTE) The Coronavirus on the Respiratory Panel, DOES NOT test for the novel  Coronavirus (2019 nCoV)    Coronavirus HKU1 NOT DETECTED NOT DETECTED Final   Coronavirus NL63 NOT DETECTED NOT DETECTED Final   Coronavirus OC43 NOT DETECTED NOT DETECTED Final   Metapneumovirus NOT DETECTED NOT DETECTED Final   Rhinovirus / Enterovirus NOT DETECTED NOT DETECTED Final   Influenza A NOT DETECTED NOT DETECTED Final   Influenza B NOT DETECTED NOT DETECTED Final   Parainfluenza Virus 1 NOT  DETECTED NOT DETECTED Final   Parainfluenza Virus 2 NOT DETECTED NOT DETECTED Final   Parainfluenza Virus 3 NOT DETECTED NOT DETECTED Final   Parainfluenza Virus 4 NOT DETECTED NOT DETECTED Final   Respiratory Syncytial Virus NOT DETECTED NOT DETECTED Final   Bordetella pertussis NOT DETECTED NOT DETECTED Final   Bordetella Parapertussis NOT DETECTED NOT DETECTED Final   Chlamydophila pneumoniae NOT DETECTED NOT DETECTED Final   Mycoplasma pneumoniae NOT DETECTED NOT DETECTED Final    Comment: Performed at [facility], [place]. [address], [place], [place] [address]     Radiological Exams on Admission: CT Angio Chest PE W and/or Wo Contrast  Result Date: 11/19/2021 CLINICAL DATA:  Worsening pneumonia. Failed antibiotic therapy. Evaluate for pulmonary embolism or mass. EXAM: CT ANGIOGRAPHY CHEST WITH CONTRAST TECHNIQUE: Multidetector CT imaging of the chest was performed using the standard protocol during bolus administration of intravenous contrast. Multiplanar CT image reconstructions and MIPs were obtained to evaluate the vascular anatomy. RADIATION DOSE REDUCTION: This exam was performed according to the departmental dose-optimization program which includes automated exposure control, adjustment of the mA and/or kV according to patient size and/or use of iterative reconstruction technique. CONTRAST:  39m OMNIPAQUE IOHEXOL 350 MG/ML SOLN COMPARISON:  09/03/2008 FINDINGS: Cardiovascular: Satisfactory opacification of pulmonary arteries noted, and no pulmonary emboli identified. No evidence of thoracic aortic dissection or aneurysm. Mediastinum/Nodes: New mildly enlarged right paratracheal lymph node is seen measuring 1.5 cm short axis. No other pathologically enlarged lymph nodes identified within the thorax. Lungs/Pleura: Pulmonary consolidation is seen in the anterior right upper lobe which shows peripheral air bronchograms. This is suspicious for pneumonia, although carcinoma cannot definitely  be excluded. No evidence  of parenchymal cavitation. No evidence of central endobronchial obstruction. No evidence of pleural effusion. Upper abdomen: No acute findings. Musculoskeletal: No suspicious bone lesions identified. Review of the MIP images confirms the above findings. IMPRESSION: No evidence of pulmonary embolism. Pulmonary consolidation in anterior right upper lobe suspicious for pneumonia, although carcinoma cannot definitely be excluded. Consider continued radiographic follow-up versus bronchoscopy. Mildly enlarged right paratracheal lymph node, which is nonspecific and may be reactive in etiology. Recommend continued attention on follow-up imaging. Electronically Signed   By: Marlaine Hind M.D.   On: 11/19/2021 19:51   DG Chest 2 View  Result Date: 11/19/2021 CLINICAL DATA:  Shortness of breath. Right-sided chest pain. Cough. Recent diagnosis 5 days ago of right-sided pneumonia. EXAM: CHEST - 2 VIEW COMPARISON:  Chest two views 11/19/2021, 11/14/2021, and 04/08/2019; CT chest 09/03/2008 FINDINGS: Redemonstration of dense airspace consolidation within the anterior inferior aspect of the right upper lobe again consistent with pneumonia, as seen on 11/19/2021 radiograph earlier today and 11/14/2021 prior radiograph. Mild left basilar linear subsegmental atelectasis versus scarring no pleural effusion or pneumothorax. Cardiac silhouette and mediastinal contours are within normal limits. Mild-to-moderate multilevel degenerative disc changes of the thoracic spine. Minimal levocurvature of the mid to lower thoracic spine. IMPRESSION: Redemonstration of anterior inferior right upper lobe pneumonia. This is unchanged from radiograph earlier today and slightly more dense compared to 11/14/2021 radiographs. Electronically Signed   By: Yvonne Kendall M.D.   On: 11/19/2021 16:58    EKG: Independently reviewed. SR.   Assessment/Plan   1. Pneumonia  - Presents with severe right-sided pleuritic pain despite  treatment for pneumonia  - Not septic on admission, no longer having fevers, leukocytosis has resolved  - CTA negative for PE  - Pain-control, incentive spirometry, continue treatment with Rocephin and azithromycin for now    2. Insulin-dependent DM  - A1c was 9.8% in December 2020  - Continue CBG checks and insulin   3. Chronic pain  - Prescription database reviewed  - Treating severe acute pain for now   4. COPD  - Not in exacerbation on admission  - Continue as-needed albuterol    DVT prophylaxis: Lovenox  Code Status: Full  Level of Care: Level of care: Med-Surg Family Communication: Wife at bedside  Disposition Plan:  Patient is from: home  Anticipated d/c is to: Home  Anticipated d/c date is: 8/12 or 11/21/21  Patient currently: Pending pain-control, transition to oral medications  Consults called: none  Admission status: Observation     Vianne Bulls, MD [facility]  11/19/2021, 10:04 PM

## 2021-11-19 NOTE — ED Triage Notes (Signed)
Pt here from home for R side cp. Pt states he was here Sunday got dx w/ R side pneumonia, pt has been on 2 antibiotics since then, and a third antibiotics for 2 days. Pt reports symptoms have worsened. Followed up w/ PCP today and was told to come to ER for IV antibiotics.

## 2021-11-19 NOTE — ED Notes (Signed)
Dr. Myna Hidalgo at bedside at this time

## 2021-11-19 NOTE — ED Provider Notes (Signed)
[facility] Provider Note   CSN: [id number] Arrival date & time: 11/19/21  1527     History  Chief Complaint  Patient presents with   Shortness of Breath   Chest Pain    Miguel [name] is a 61 y.o. male PMH COPD, diabetes who is presenting with right-sided chest pain.  Patient reports he was recently diagnosed with a pneumonia on the right lung last week he had.  Since then, he has been taking amoxicillin and doxycycline.  He followed up with his primary care doctor on Wednesday, and had levofloxacin added to this regimen. He reports taking his medication as prescribed.  However, he has noticed worsening right-sided chest pain that he localizes to his right pec.  There is no radiation of the pain.  There is no association with nausea, vomiting, diaphoresis.  This chest pain is present only when he coughs.  His cough was initially productive of a green sputum, however is no longer productive.  He notes intermittent shortness of breath, mostly when he is exerting himself or during a bout of coughing.  Otherwise, patient denies any fevers in the last 2 to 3 days.  Denies any nausea, vomiting, diarrhea, abdominal pain, peripheral edema.   Shortness of Breath Associated symptoms: chest pain   Chest Pain Associated symptoms: shortness of breath        Home Medications Prior to Admission medications   Medication Sig Start Date End Date Taking? Authorizing Provider  ALEVE 220 MG tablet Take 220-440 mg by mouth 2 (two) times daily as needed (for pain).    [provider]  amoxicillin-clavulanate (AUGMENTIN) 875-125 MG tablet Take 1 tablet by mouth every 12 (twelve) hours. 11/14/21   Fatima Blank, MD  ASPIRIN 81 PO Take 81 mg by mouth See admin instructions. Take 81 mg by mouth every other night    [provider]  doxycycline (VIBRAMYCIN) 100 MG capsule Take 1 capsule (100 mg total) by mouth 2 (two) times daily for 7 days.  11/14/21 11/21/21  Fatima Blank, MD  Dulaglutide (TRULICITY) 0.81 KG/8.1EH SOPN Inject 0.75 mg into the skin every Tuesday.    [provider]  gabapentin (NEURONTIN) 600 MG tablet Take 600 mg by mouth 2 (two) times daily.    [provider]  Insulin Glargine (LANTUS SOLOSTAR) 100 UNIT/ML Solostar Pen Inject 30 Units into the skin at bedtime.    [provider]  lisinopril (ZESTRIL) 5 MG tablet Take 5 mg by mouth daily.    [provider]  Oxycodone HCl 20 MG TABS Take 20 mg by mouth 6 (six) times daily.    [provider]  polyethylene glycol (MIRALAX) 17 g packet Take 17 g by mouth daily for 14 days. 11/12/21 11/26/21  Loeffler, Adora Fridge, PA-C  sertraline (ZOLOFT) 100 MG tablet Take 100 mg by mouth at bedtime.    [provider]  tadalafil (CIALIS) 20 MG tablet Take 20 mg by mouth daily as needed for erectile dysfunction.    [provider]  tiZANidine (ZANAFLEX) 4 MG tablet Take 4 mg by mouth every 8 (eight) hours as needed for muscle spasms.    [provider]  traZODone (DESYREL) 50 MG tablet Take 50 mg by mouth at bedtime.    [provider]      Allergies    Cymbalta [duloxetine hcl], Statins, and Zolpidem    Review of Systems   Review of Systems  Respiratory:  Positive  for shortness of breath.   Cardiovascular:  Positive for chest pain.    Physical Exam Updated Vital Signs BP 127/71 (BP Location: Right Arm)   Pulse 63   Temp 98.7 F (37.1 C) (Oral)   Resp 14   SpO2 97%  Physical Exam Vitals and nursing note reviewed.  Constitutional:      General: He is not in acute distress.    Appearance: He is well-developed. He is not ill-appearing or diaphoretic.  HENT:     Head: Normocephalic and atraumatic.  Eyes:     Conjunctiva/sclera: Conjunctivae normal.  Cardiovascular:     Rate and Rhythm: Normal rate and regular rhythm.     Heart sounds: No murmur heard. Pulmonary:     Effort: Pulmonary  effort is normal. No respiratory distress.     Breath sounds: Examination of the right-upper field reveals rales. Examination of the right-middle field reveals rales. Rales present. No wheezing.  Abdominal:     Palpations: Abdomen is soft.     Tenderness: There is no abdominal tenderness.  Musculoskeletal:        General: No swelling.     Cervical back: Neck supple.     Right lower leg: No edema.     Left lower leg: No edema.  Skin:    General: Skin is warm and dry.  Neurological:     Mental Status: He is alert.  Psychiatric:        Mood and Affect: Mood normal.     ED Results / Procedures / Treatments   Labs (all labs ordered are listed, but only abnormal results are displayed) Labs Reviewed  BASIC METABOLIC PANEL - Abnormal; Notable for the following components:      Result Value   Sodium 132 (*)    Glucose, Bld 277 (*)    All other components within normal limits  CBC WITH DIFFERENTIAL/PLATELET - Abnormal; Notable for the following components:   Hemoglobin 12.4 (*)    Platelets 544 (*)    Neutro Abs 8.2 (*)    Abs Immature Granulocytes 0.09 (*)    All other components within normal limits    EKG EKG Interpretation  Date/Time:  Friday November 19 2021 16:17:53 EDT Ventricular Rate:  66 PR Interval:  142 QRS Duration: 88 QT Interval:  428 QTC Calculation: 448 R Axis:   42 Text Interpretation: Normal sinus rhythm Possible Inferior infarct , age undetermined Abnormal ECG When compared with ECG of 19-Nov-2021 16:05, No significant change since last tracing Confirmed by Pattricia Boss [phone]) on 11/19/2021 4:35:51 PM  Radiology CT Angio Chest PE W and/or Wo Contrast  Result Date: 11/19/2021 CLINICAL DATA:  Worsening pneumonia. Failed antibiotic therapy. Evaluate for pulmonary embolism or mass. EXAM: CT ANGIOGRAPHY CHEST WITH CONTRAST TECHNIQUE: Multidetector CT imaging of the chest was performed using the standard protocol during bolus administration of intravenous  contrast. Multiplanar CT image reconstructions and MIPs were obtained to evaluate the vascular anatomy. RADIATION DOSE REDUCTION: This exam was performed according to the departmental dose-optimization program which includes automated exposure control, adjustment of the mA and/or kV according to patient size and/or use of iterative reconstruction technique. CONTRAST:  10m OMNIPAQUE IOHEXOL 350 MG/ML SOLN COMPARISON:  09/03/2008 FINDINGS: Cardiovascular: Satisfactory opacification of pulmonary arteries noted, and no pulmonary emboli identified. No evidence of thoracic aortic dissection or aneurysm. Mediastinum/Nodes: New mildly enlarged right paratracheal lymph node is seen measuring 1.5 cm short axis. No other pathologically enlarged lymph nodes identified within the thorax. Lungs/Pleura: Pulmonary consolidation  is seen in the anterior right upper lobe which shows peripheral air bronchograms. This is suspicious for pneumonia, although carcinoma cannot definitely be excluded. No evidence of parenchymal cavitation. No evidence of central endobronchial obstruction. No evidence of pleural effusion. Upper abdomen: No acute findings. Musculoskeletal: No suspicious bone lesions identified. Review of the MIP images confirms the above findings. IMPRESSION: No evidence of pulmonary embolism. Pulmonary consolidation in anterior right upper lobe suspicious for pneumonia, although carcinoma cannot definitely be excluded. Consider continued radiographic follow-up versus bronchoscopy. Mildly enlarged right paratracheal lymph node, which is nonspecific and may be reactive in etiology. Recommend continued attention on follow-up imaging. Electronically Signed   By: Marlaine Hind M.D.   On: 11/19/2021 19:51   DG Chest 2 View  Result Date: 11/19/2021 CLINICAL DATA:  Shortness of breath. Right-sided chest pain. Cough. Recent diagnosis 5 days ago of right-sided pneumonia. EXAM: CHEST - 2 VIEW COMPARISON:  Chest two views 11/19/2021,  11/14/2021, and 04/08/2019; CT chest 09/03/2008 FINDINGS: Redemonstration of dense airspace consolidation within the anterior inferior aspect of the right upper lobe again consistent with pneumonia, as seen on 11/19/2021 radiograph earlier today and 11/14/2021 prior radiograph. Mild left basilar linear subsegmental atelectasis versus scarring no pleural effusion or pneumothorax. Cardiac silhouette and mediastinal contours are within normal limits. Mild-to-moderate multilevel degenerative disc changes of the thoracic spine. Minimal levocurvature of the mid to lower thoracic spine. IMPRESSION: Redemonstration of anterior inferior right upper lobe pneumonia. This is unchanged from radiograph earlier today and slightly more dense compared to 11/14/2021 radiographs. Electronically Signed   By: Yvonne Kendall M.D.   On: 11/19/2021 16:58    Procedures Procedures   Medications Ordered in ED Medications - No data to display  ED Course/ Medical Decision Making/ A&P    Medical Decision Making Amount and/or Complexity of Data Reviewed Radiology: ordered.  Risk Prescription drug management. Decision regarding hospitalization.   Miguel [name] is a 61 y.o. male with significant PMHx recent right-sided pneumonia who presented to the ED with right-sided chest pain and worsening shortness of breath.  Patient has been taking outpatient antibiotics including amoxicillin, doxycycline, Levaquin for this pneumonia without improvement in his symptoms.  Vitals at presentation within normal limits.  Patient is hemodynamically stable, afebrile, satting well on room air.  Physical exam reassuring.  Normal heart sounds.  Lungs clear to auscultation bilaterally.  Abdomen is soft, nontender to palpation.  No pitting edema.  Initial differential includes but is not limited to: Pneumonia, pleural effusion, pneumothorax, CHF, COPD  Lab work obtained, resulted notable for CBC without leukocytosis.  Hemoglobin 12.4.  BMP  with sodium mildly low at 132.  Otherwise significant derangements.  Imaging was pursued, notable for chest x-ray with concerns for increasing density of previously seen infiltrate with redemonstration of anterior inferior right upper lobe pneumonia.  CT chest obtained.  Resulted negative for evidence of pulmonary embolism.  Notable for pulmonary consolidation in right upper lobe, concerning for pneumonia, however radiology reports they cannot definitely exclude carcinoma.  They recommend additional radiographic follow-up versus bronchoscopy.  EKG obtained, demonstrates sinus rhythm, ventricular rate 66 bpm.  No evidence of abnormal intervals or acute ischemia.  Interpreted by myself and my attending.  Interventions include IV antibiotics  We will admit the patient to hospitalist for community-acquired pneumonia failing outpatient antibiotic therapy and for additional follow-up of his pulmonary consolidation.  Patient admitted.  The plan for this patient was discussed with Dr. Jeanell Sparrow, who voiced agreement and who oversaw evaluation and treatment  of this patient.    Final Clinical Impression(s) / ED Diagnoses Final diagnoses:  Pneumonia of right upper lobe due to infectious organism    Rx / DC Orders ED Discharge Orders     None         Faylene Million, MD 11/19/21 2045    Pattricia Boss, MD 11/20/21 2317

## 2021-11-20 DIAGNOSIS — J189 Pneumonia, unspecified organism: Secondary | ICD-10-CM | POA: Diagnosis not present

## 2021-11-20 DIAGNOSIS — R091 Pleurisy: Secondary | ICD-10-CM

## 2021-11-20 DIAGNOSIS — E119 Type 2 diabetes mellitus without complications: Secondary | ICD-10-CM | POA: Diagnosis not present

## 2021-11-20 DIAGNOSIS — J449 Chronic obstructive pulmonary disease, unspecified: Secondary | ICD-10-CM | POA: Diagnosis not present

## 2021-11-20 DIAGNOSIS — R0602 Shortness of breath: Secondary | ICD-10-CM | POA: Diagnosis not present

## 2021-11-20 LAB — BASIC METABOLIC PANEL
Anion gap: 10 (ref 5–15)
BUN: 9 mg/dL (ref 8–23)
CO2: 24 mmol/L (ref 22–32)
Calcium: 9.3 mg/dL (ref 8.9–10.3)
Chloride: 102 mmol/L (ref 98–111)
Creatinine, Ser: 0.96 mg/dL (ref 0.61–1.24)
GFR, Estimated: >60 mL/min (ref >60)
Glucose, Bld: 125 mg/dL — ABNORMAL HIGH (ref 70–99)
Potassium: 4 mmol/L (ref 3.5–5.1)
Sodium: 136 mmol/L (ref 135–145)

## 2021-11-20 LAB — CBC
HCT: 33.6 % — ABNORMAL LOW (ref 39.0–52.0)
Hemoglobin: 11.3 g/dL — ABNORMAL LOW (ref 13.0–17.0)
MCH: 28 pg (ref 26.0–34.0)
MCHC: 33.6 g/dL (ref 30.0–36.0)
MCV: 83.4 fL (ref 80.0–100.0)
Platelets: 477 10*3/uL — ABNORMAL HIGH (ref 150–400)
RBC: 4.03 MIL/uL — ABNORMAL LOW (ref 4.22–5.81)
RDW: 13.7 % (ref 11.5–15.5)
WBC: 9.1 10*3/uL (ref 4.0–10.5)
nRBC: 0 % (ref 0.0–0.2)

## 2021-11-20 LAB — GLUCOSE, CAPILLARY
Glucose-Capillary: 128 mg/dL — ABNORMAL HIGH (ref 70–99)
Glucose-Capillary: 197 mg/dL — ABNORMAL HIGH (ref 70–99)
Glucose-Capillary: 142 mg/dL — ABNORMAL HIGH (ref 70–99)
Glucose-Capillary: 195 mg/dL — ABNORMAL HIGH (ref 70–99)

## 2021-11-20 LAB — CBG MONITORING, ED: Glucose-Capillary: 113 mg/dL — ABNORMAL HIGH (ref 70–99)

## 2021-11-20 MED ORDER — GUAIFENESIN ER 600 MG PO TB12
600.0000 mg | ORAL_TABLET | Freq: Two times a day (BID) | ORAL | Status: DC
Start: 2021-11-20 — End: 2021-11-21
  Administered 2021-11-20 – 2021-11-21 (×3): 600 mg via ORAL
  Filled 2021-11-20 (×3): qty 1

## 2021-11-20 NOTE — Progress Notes (Deleted)
New admission to the floor from the ED. Patient alert and oriented. Denies any needs at this time. Call bell in reach and bed in lowest lock position. See flowsheet for assessment.

## 2021-11-20 NOTE — Progress Notes (Signed)
  Progress Note   Patient: Miguel [name] WJX:[id number] DOB: July 06, 1960 DOA: 11/19/2021     0 DOS: the patient was seen and examined on 11/20/2021   Brief hospital course: TIMOFEY CARANDANG is a pleasant 61 y.o. male with medical history significant for type 2 diabetes mellitus, hypertension, depression, anxiety, chronic back pain, recent gastroenteritis, and recent pneumonia diagnosis who presents with severe pleuritic pain on the right.  Patient was seen in the emergency department on 11/14/2021 with productive cough, fevers, and vomiting.  He was found to have a pneumonia and discharged home with doxycycline and Augmentin.  He saw his PCP on 11/17/2021 with persistent symptoms and was given Levaquin.  Since then, he is no longer having any fevers or chills, continues to have some sputum production, but has developed severe right-sided pleuritic pain.  He denies leg swelling or tenderness, denies hemoptysis, and reports that his recent diarrhea resolved.  He no longer smokes.  Assessment and Plan: PNA Pleuritis     - he feels like he is breathing a little better, but still having decent pain with inspiration     - I think we get him another day of IV abx and pain control     - add guaifenesin  Insulin dependent DM     - semglee, SSI, glucose checks, DM diet  Chronic pain     - PRN dilaudid, oxy, APAP, muscle relaxers  COPD     - continue nebs     Subjective: "It still hurts breath deep, but I've been hitting this thing hard."  Physical Exam: Vitals:   11/20/21 0137 11/20/21 0533 11/20/21 0726 11/20/21 1123  BP: 123/76 110/69 120/79 132/71  Pulse: (!) 51 (!) 52 (!) 51 (!) 58  Resp: '16 18 20 20  '$ Temp: 98.5 F (36.9 C) 97.6 F (36.4 C) 98.1 F (36.7 C) 97.7 F (36.5 C)  TempSrc: Oral Oral Oral Oral  SpO2: 95% 97% 98% 96%  Weight:      Height:       General: 61 y.o. male resting in bed in NAD Eyes: PERRL, normal sclera ENMT: Nares patent w/o discharge, orophaynx clear, dentition  normal, ears w/o discharge/lesions/ulcers Neck: Supple, trachea midline Cardiovascular: RRR, +S1, S2, 3/6 SEM, no g/r, equal pulses throughout Respiratory: soft rhonchi on right, pain with inspiration, spatic cough GI: BS+, NDNT, no masses noted, no organomegaly noted MSK: No e/c/c Neuro: A&O x 3, no focal deficits Psyc: Appropriate interaction and affect, calm/cooperative  Data Reviewed:  Lab Results  Component Value Date   NA 136 11/20/2021   K 4.0 11/20/2021   CO2 24 11/20/2021   GLUCOSE 125 (H) 11/20/2021   BUN 9 11/20/2021   CREATININE 0.96 11/20/2021   CALCIUM 9.3 11/20/2021   GFRNONAA >60 11/20/2021   Lab Results  Component Value Date   WBC 9.1 11/20/2021   HGB 11.3 (L) 11/20/2021   HCT 33.6 (L) 11/20/2021   MCV 83.4 11/20/2021   PLT 477 (H) 11/20/2021   Family Communication: w/ wife at bedside  Disposition: Status is: Observation  Planned Discharge Destination: Home    Time spent: 25 minutes  Author: Jonnie Finner, DO 11/20/2021 12:14 PM  For on call review [email or web address].

## 2021-11-20 NOTE — Progress Notes (Signed)
Mobility Specialist Criteria Algorithm Info.   11/20/21 1245  Oxygen Therapy  SpO2 97 %  O2 Device Room Air  Mobility  Activity Ambulated independently in hallway  Range of Motion/Exercises Active;All extremities  Level of Assistance Independent  Assistive Device None  Distance Ambulated (ft) 1100 ft  Activity Response Tolerated well   Pre Mobility: SpO2 100 During Mobility: SpO2 97 Post Mobility: SpO2 97  Patient agreeable to participate in mobility. Ambulated in independently with steady gait. No complaints of SOB, some tightness but otherwise asx. Returned to room without complaint or incident. Was left standing in room with all needs met, call bell in reach.     Miguel [name], [place], [place]  EHMCN:[phone] Office: [phone]

## 2021-11-20 NOTE — Progress Notes (Addendum)
New admission to floor from ED. Patient alert and oriented. Denies any needs at this time. Call bell within reach, bed in lowest lock position. See flowsheet for assessment.

## 2021-11-21 DIAGNOSIS — N179 Acute kidney failure, unspecified: Secondary | ICD-10-CM | POA: Diagnosis not present

## 2021-11-21 DIAGNOSIS — G894 Chronic pain syndrome: Secondary | ICD-10-CM | POA: Diagnosis not present

## 2021-11-21 DIAGNOSIS — J449 Chronic obstructive pulmonary disease, unspecified: Secondary | ICD-10-CM | POA: Diagnosis not present

## 2021-11-21 DIAGNOSIS — R091 Pleurisy: Secondary | ICD-10-CM

## 2021-11-21 DIAGNOSIS — J189 Pneumonia, unspecified organism: Secondary | ICD-10-CM | POA: Diagnosis not present

## 2021-11-21 LAB — CBC WITH DIFFERENTIAL/PLATELET
Abs Immature Granulocytes: 0.09 10*3/uL — ABNORMAL HIGH (ref 0.00–0.07)
Basophils Absolute: 0.1 10*3/uL (ref 0.0–0.1)
Basophils Relative: 1 %
Eosinophils Absolute: 0.1 10*3/uL (ref 0.0–0.5)
Eosinophils Relative: 2 %
HCT: 33.2 % — ABNORMAL LOW (ref 39.0–52.0)
Hemoglobin: 11.3 g/dL — ABNORMAL LOW (ref 13.0–17.0)
Immature Granulocytes: 1 %
Lymphocytes Relative: 19 %
Lymphs Abs: 1.7 10*3/uL (ref 0.7–4.0)
MCH: 28.4 pg (ref 26.0–34.0)
MCHC: 34 g/dL (ref 30.0–36.0)
MCV: 83.4 fL (ref 80.0–100.0)
Monocytes Absolute: 0.7 10*3/uL (ref 0.1–1.0)
Monocytes Relative: 7 %
Neutro Abs: 6.5 10*3/uL (ref 1.7–7.7)
Neutrophils Relative %: 70 %
Platelets: 439 10*3/uL — ABNORMAL HIGH (ref 150–400)
RBC: 3.98 MIL/uL — ABNORMAL LOW (ref 4.22–5.81)
RDW: 13.9 % (ref 11.5–15.5)
WBC: 9.1 10*3/uL (ref 4.0–10.5)
nRBC: 0 % (ref 0.0–0.2)

## 2021-11-21 LAB — COMPREHENSIVE METABOLIC PANEL
ALT: 69 U/L — ABNORMAL HIGH (ref 0–44)
AST: 45 U/L — ABNORMAL HIGH (ref 15–41)
Albumin: 2.6 g/dL — ABNORMAL LOW (ref 3.5–5.0)
Alkaline Phosphatase: 116 U/L (ref 38–126)
Anion gap: 10 (ref 5–15)
BUN: 13 mg/dL (ref 8–23)
CO2: 26 mmol/L (ref 22–32)
Calcium: 9 mg/dL (ref 8.9–10.3)
Chloride: 97 mmol/L — ABNORMAL LOW (ref 98–111)
Creatinine, Ser: 1.31 mg/dL — ABNORMAL HIGH (ref 0.61–1.24)
GFR, Estimated: >60 mL/min (ref >60)
Glucose, Bld: 173 mg/dL — ABNORMAL HIGH (ref 70–99)
Potassium: 3.9 mmol/L (ref 3.5–5.1)
Sodium: 133 mmol/L — ABNORMAL LOW (ref 135–145)
Total Bilirubin: 0.4 mg/dL (ref 0.3–1.2)
Total Protein: 6.7 g/dL (ref 6.5–8.1)

## 2021-11-21 LAB — GLUCOSE, CAPILLARY
Glucose-Capillary: 197 mg/dL — ABNORMAL HIGH (ref 70–99)
Glucose-Capillary: 224 mg/dL — ABNORMAL HIGH (ref 70–99)

## 2021-11-21 LAB — CREATININE, SERUM
Creatinine, Ser: 1.14 mg/dL (ref 0.61–1.24)
GFR, Estimated: >60 mL/min (ref >60)

## 2021-11-21 MED ORDER — GUAIFENESIN ER 600 MG PO TB12: 600.0000 mg | ORAL_TABLET | Freq: Two times a day (BID) | ORAL | 0 refills | Status: AC

## 2021-11-21 MED ORDER — AZITHROMYCIN 500 MG PO TABS: 500.0000 mg | ORAL_TABLET | Freq: Every day | ORAL | 0 refills | Status: DC

## 2021-11-21 MED ORDER — CEFDINIR 300 MG PO CAPS: 300.0000 mg | ORAL_CAPSULE | Freq: Two times a day (BID) | ORAL | 0 refills | Status: AC

## 2021-11-21 MED ORDER — AZITHROMYCIN 500 MG PO TABS: 500.0000 mg | ORAL_TABLET | Freq: Every day | ORAL | 0 refills | Status: AC

## 2021-11-21 NOTE — Discharge Summary (Signed)
Miguel [name] YDX:[id number] DOB: 11-04-1960 DOA: 11/19/2021  PCP: Yelm Nation, MD  Admit date: 11/19/2021 Discharge date: 11/21/2021  Admitted From: home Disposition:  home  Recommendations for Outpatient Follow-up:  Follow up with PCP in 1 week Please obtain BMP/CBC in one week Will need to have pcp do repeat imaging v.s. bronchoscopy    Discharge Condition:Stable CODE STATUS: Full Diet recommendation: Heart Healthy / Carb Modified  Brief/Interim Summary: Per HPI   Miguel [name] is a pleasant 61 y.o. male with medical history significant for type 2 diabetes mellitus, hypertension, depression, anxiety, chronic back pain, recent gastroenteritis, and recent pneumonia diagnosis who presents with severe pleuritic pain on the right.  Patient was seen in the emergency department on 11/14/2021 with productive cough, fevers, and vomiting.  He was found to have a pneumonia and discharged home with doxycycline and Augmentin.  He saw his PCP on 11/17/2021 with persistent symptoms and was given Levaquin.  Since then, he is no longer having any fevers or chills, continues to have some sputum production, but has developed severe right-sided pleuritic pain.  He denies leg swelling or tenderness, denies hemoptysis, and reports that his recent diarrhea resolved.  He no longer smokes.   ED Course: Upon arrival to the ED, patient is found to be afebrile and saturating well on room air with stable blood pressure.  Chest x-ray notable for right upper lobe pneumonia which appeared slightly more dense than on 11/14/2021.  CTA chest is negative for PE but notable for right upper lobe consolidation.  Blood work notable for resolution of his leukocytosis, glucose 277, and platelets 544,000.  Patient was treated with Rocephin and azithromycin ,feels better and wants to be discharged as his daughter is having a emergency surgery in [place]. She ambulated and denies any DOE. Pleuritic cp improved.   1. Pneumonia  -  Presents with severe right-sided pleuritic pain despite treatment for pneumonia  CT with Pulmonary consolidation in anterior right upper lobe suspicious for pneumonia, although carcinoma cannot definitely be excluded.Consider continued radiographic follow-up versus bronchoscopy - CTA negative for PE  Was started on IV Rocephin and azithromycin and will DC on p.o. medications to complete full course Clinically has improved   2. Insulin-dependent DM  Continue home meds   3. Chronic pain  Continue home meds follow-up with PCP for further management   4. COPD  - Not in exacerbation on admission   5.AKI  Prerenal Improved with increase po hydration   Discharge Diagnoses:  Principal Problem:   Right upper lobe pneumonia Active Problems:   COPD (chronic obstructive pulmonary disease) (HCC)   Chronic pain syndrome   Diabetes (HCC)   Pleuritis   PNA (pneumonia)   AKI (acute kidney injury) [facility])    Discharge Instructions  Discharge Instructions     Call MD for:  temperature >100.4   Complete by: As directed    Diet - low sodium heart healthy   Complete by: As directed    Discharge instructions   Complete by: As directed    Follow up with pcp in one week, need to get repear imaging or versus bronchoscopy.   Increase activity slowly   Complete by: As directed       Allergies as of 11/21/2021       Reactions   Cymbalta [duloxetine Hcl] Other (See Comments)   Agitation   Statins Other (See Comments)   Extreme fatigue, myalgias (lipitor, zocor, crestor)   Zolpidem Other (See Comments)  Made the patient sleep walk        Medication List     STOP taking these medications    Aleve 220 MG tablet Generic drug: naproxen sodium   amoxicillin-clavulanate 875-125 MG tablet Commonly known as: AUGMENTIN   doxycycline 100 MG capsule Commonly known as: VIBRAMYCIN   levofloxacin 750 MG tablet Commonly known as: LEVAQUIN   metFORMIN 500 MG 24 hr tablet Commonly known  as: GLUCOPHAGE-XR       TAKE these medications    ASPIRIN 81 PO Take 81 mg by mouth every other day.   azithromycin 500 MG tablet Commonly known as: ZITHROMAX Take 1 tablet (500 mg total) by mouth daily for 5 days.   cefdinir 300 MG capsule Commonly known as: OMNICEF Take 1 capsule (300 mg total) by mouth 2 (two) times daily for 7 days.   gabapentin 600 MG tablet Commonly known as: NEURONTIN Take 600 mg by mouth 2 (two) times daily.   guaiFENesin 600 MG 12 hr tablet Commonly known as: MUCINEX Take 1 tablet (600 mg total) by mouth 2 (two) times daily for 7 days.   Lantus SoloStar 100 UNIT/ML Solostar Pen Generic drug: insulin glargine Inject 30 Units into the skin at bedtime.   lisinopril 5 MG tablet Commonly known as: ZESTRIL Take 5 mg by mouth daily.   Oxycodone HCl 20 MG Tabs Take 20 mg by mouth 6 (six) times daily.   polyethylene glycol 17 g packet Commonly known as: MiraLax Take 17 g by mouth daily for 14 days.   sertraline 100 MG tablet Commonly known as: ZOLOFT Take 100 mg by mouth at bedtime.   tadalafil 20 MG tablet Commonly known as: CIALIS Take 20 mg by mouth daily as needed for erectile dysfunction.   tiZANidine 4 MG tablet Commonly known as: ZANAFLEX Take 4 mg by mouth every 8 (eight) hours as needed for muscle spasms.   traZODone 50 MG tablet Commonly known as: DESYREL Take 50 mg by mouth at bedtime.   Trulicity 3.82 NK/5.3ZJ Sopn Generic drug: Dulaglutide Inject 0.75 mg into the skin every Tuesday.        Allergies  Allergen Reactions   Cymbalta [Duloxetine Hcl] Other (See Comments)    Agitation    Statins Other (See Comments)    Extreme fatigue, myalgias (lipitor, zocor, crestor)   Zolpidem Other (See Comments)    Made the patient sleep walk    Consultations:    Procedures/Studies: CT Angio Chest PE W and/or Wo Contrast  Result Date: 11/19/2021 CLINICAL DATA:  Worsening pneumonia. Failed antibiotic therapy. Evaluate for  pulmonary embolism or mass. EXAM: CT ANGIOGRAPHY CHEST WITH CONTRAST TECHNIQUE: Multidetector CT imaging of the chest was performed using the standard protocol during bolus administration of intravenous contrast. Multiplanar CT image reconstructions and MIPs were obtained to evaluate the vascular anatomy. RADIATION DOSE REDUCTION: This exam was performed according to the departmental dose-optimization program which includes automated exposure control, adjustment of the mA and/or kV according to patient size and/or use of iterative reconstruction technique. CONTRAST:  89m OMNIPAQUE IOHEXOL 350 MG/ML SOLN COMPARISON:  09/03/2008 FINDINGS: Cardiovascular: Satisfactory opacification of pulmonary arteries noted, and no pulmonary emboli identified. No evidence of thoracic aortic dissection or aneurysm. Mediastinum/Nodes: New mildly enlarged right paratracheal lymph node is seen measuring 1.5 cm short axis. No other pathologically enlarged lymph nodes identified within the thorax. Lungs/Pleura: Pulmonary consolidation is seen in the anterior right upper lobe which shows peripheral air bronchograms. This is suspicious for pneumonia, although carcinoma  cannot definitely be excluded. No evidence of parenchymal cavitation. No evidence of central endobronchial obstruction. No evidence of pleural effusion. Upper abdomen: No acute findings. Musculoskeletal: No suspicious bone lesions identified. Review of the MIP images confirms the above findings. IMPRESSION: No evidence of pulmonary embolism. Pulmonary consolidation in anterior right upper lobe suspicious for pneumonia, although carcinoma cannot definitely be excluded. Consider continued radiographic follow-up versus bronchoscopy. Mildly enlarged right paratracheal lymph node, which is nonspecific and may be reactive in etiology. Recommend continued attention on follow-up imaging. Electronically Signed   By: Marlaine Hind M.D.   On: 11/19/2021 19:51   DG Chest 2  View  Result Date: 11/19/2021 CLINICAL DATA:  Shortness of breath. Right-sided chest pain. Cough. Recent diagnosis 5 days ago of right-sided pneumonia. EXAM: CHEST - 2 VIEW COMPARISON:  Chest two views 11/19/2021, 11/14/2021, and 04/08/2019; CT chest 09/03/2008 FINDINGS: Redemonstration of dense airspace consolidation within the anterior inferior aspect of the right upper lobe again consistent with pneumonia, as seen on 11/19/2021 radiograph earlier today and 11/14/2021 prior radiograph. Mild left basilar linear subsegmental atelectasis versus scarring no pleural effusion or pneumothorax. Cardiac silhouette and mediastinal contours are within normal limits. Mild-to-moderate multilevel degenerative disc changes of the thoracic spine. Minimal levocurvature of the mid to lower thoracic spine. IMPRESSION: Redemonstration of anterior inferior right upper lobe pneumonia. This is unchanged from radiograph earlier today and slightly more dense compared to 11/14/2021 radiographs. Electronically Signed   By: Yvonne Kendall M.D.   On: 11/19/2021 16:58   DG Chest 2 View  Result Date: 11/14/2021 CLINICAL DATA:  Fever and vomiting. EXAM: CHEST - 2 VIEW COMPARISON:  04/08/2019 FINDINGS: Heart size and mediastinal contours are unremarkable. No pleural effusion or edema. There is dense airspace consolidation within the anterior basal right upper lobe consistent with pneumonia. Left lung appears clear. Osseous structures appear intact. IMPRESSION: Right upper lobe pneumonia. Followup PA and lateral chest X-ray is recommended in 3-4 weeks following trial of antibiotic therapy to ensure resolution and exclude underlying malignancy. Electronically Signed   By: Kerby Moors M.D.   On: 11/14/2021 05:59   CT Abdomen Pelvis W Contrast  Result Date: 11/12/2021 CLINICAL DATA:  RLQ abdominal pain (Age >= 14y) Bowel obstruction suspected. Patient was discharged from [facility] yesterday approximately 3 p.m. after being admitted for  SBO. Woke up this morning with nausea and 2 episodes of vomiting EXAM: CT ABDOMEN AND PELVIS WITH CONTRAST TECHNIQUE: Multidetector CT imaging of the abdomen and pelvis was performed using the standard protocol following bolus administration of intravenous contrast. RADIATION DOSE REDUCTION: This exam was performed according to the departmental dose-optimization program which includes automated exposure control, adjustment of the mA and/or kV according to patient size and/or use of iterative reconstruction technique. CONTRAST:  170m OMNIPAQUE IOHEXOL 300 MG/ML  SOLN COMPARISON:  November 08, 2021 FINDINGS: Lower chest: No acute abnormality. Hepatobiliary: No focal liver abnormality is seen. Hepatic steatosis. No gallstones, gallbladder wall thickening, or biliary dilatation. Pancreas: Unremarkable. No pancreatic ductal dilatation or surrounding inflammatory changes. Spleen: Normal in size without focal abnormality. Adrenals/Urinary Tract: Adrenal glands are unremarkable. Kidneys are normal, without renal calculi, focal lesion, or hydronephrosis. Bladder is unremarkable. Stomach/Bowel: Stomach is within normal limits. Appendix appears normal. No evidence of bowel wall thickening, distention, or inflammatory changes. Previously seen dilatation of the duodenum and jejunum has significantly resolved. Moderately large stool burden predominantly in the ascending colon. Mild diverticulosis of the sigmoid colon without diverticulitis. Vascular/Lymphatic: No significant vascular findings are present. No  significantly enlarged abdominal or pelvic lymph nodes. Subcentimeter inguinal lymph nodes. Reproductive: Prostate is unremarkable. Other: No abdominal wall hernia or abnormality. No abdominopelvic ascites. Small air pockets in the anterior pelvic wall. Musculoskeletal: Again seen are the laminectomy/fusion changes in the lumbar spine from L3 through L5 with intervertebral disc cages. Moderate lumbar spondylosis. IMPRESSION: 1.  There has been interval significant resolution of the proximal small bowel obstruction. Previously seen dilated duodenum and jejunum have near completely resolved with minimal dilatation of the duodenum and duodenal jejunal junction measuring 3 cm in greatest dimension. 2.  Hepatic steatosis. 3. No free air, fluid, inflammatory changes, mass or significant lymphadenopathy. Electronically Signed   By: Frazier Richards M.D.   On: 11/12/2021 09:41   ECHOCARDIOGRAM COMPLETE  Result Date: 11/10/2021    ECHOCARDIOGRAM REPORT   Patient Name:   COLEBY YETT Date of Exam: 11/10/2021 Medical Rec #:  [id number]        Height:       72.0 in Accession #:    [id number]       Weight:       180.6 lb Date of Birth:  06-06-1960         BSA:          2.040 m Patient Age:    18 years         BP:           123/66 mmHg Patient Gender: M                HR:           56 bpm. Exam Location:  Inpatient Procedure: 2D Echo, Cardiac Doppler and Color Doppler Indications:    Murmur R01.1  History:        Patient has no prior history of Echocardiogram examinations.                 Risk Factors:Hypertension, Diabetes, Dyslipidemia and Sleep                 Apnea.  Sonographer:    Darlina Sicilian RDCS Referring Phys: [id number] Stoy XU  Sonographer Comments: PEDOF not performed per patient request. IMPRESSIONS  1. Left ventricular ejection fraction, by estimation, is 55 to 60%. The left ventricle has normal function. The left ventricle has no regional wall motion abnormalities. There is mild left ventricular hypertrophy. Left ventricular diastolic parameters are consistent with Grade I diastolic dysfunction (impaired relaxation).  2. Right ventricular systolic function is normal. The right ventricular size is normal. There is normal pulmonary artery systolic pressure. The estimated right ventricular systolic pressure is 98.9 mmHg.  3. The mitral valve is normal in structure. No evidence of mitral valve regurgitation.  4. The aortic valve is tricuspid.  There is moderate calcification of the aortic valve. Aortic valve regurgitation is not visualized. Aortic valve sclerosis/calcification is present, without any evidence of aortic stenosis. Aortic valve mean gradient measures 9.0 mmHg.  5. Aortic dilatation noted. There is mild dilatation of the ascending aorta, measuring 39 mm.  6. The inferior vena cava is normal in size with greater than 50% respiratory variability, suggesting right atrial pressure of 3 mmHg. FINDINGS  Left Ventricle: Left ventricular ejection fraction, by estimation, is 55 to 60%. The left ventricle has normal function. The left ventricle has no regional wall motion abnormalities. The left ventricular internal cavity size was normal in size. There is  mild left ventricular hypertrophy. Left ventricular diastolic parameters are consistent with Grade I diastolic  dysfunction (impaired relaxation). Right Ventricle: The right ventricular size is normal. No increase in right ventricular wall thickness. Right ventricular systolic function is normal. There is normal pulmonary artery systolic pressure. The tricuspid regurgitant velocity is 2.46 m/s, and  with an assumed right atrial pressure of 3 mmHg, the estimated right ventricular systolic pressure is 29.5 mmHg. Left Atrium: Left atrial size was normal in size. Right Atrium: Right atrial size was normal in size. Pericardium: There is no evidence of pericardial effusion. Mitral Valve: The mitral valve is normal in structure. No evidence of mitral valve regurgitation. Tricuspid Valve: The tricuspid valve is normal in structure. Tricuspid valve regurgitation is trivial. Aortic Valve: The aortic valve is tricuspid. There is moderate calcification of the aortic valve. Aortic valve regurgitation is not visualized. Aortic valve sclerosis/calcification is present, without any evidence of aortic stenosis. Aortic valve mean gradient measures 9.0 mmHg. Aortic valve peak gradient measures 18.1 mmHg. Aortic valve  area, by VTI measures 2.23 cm. Pulmonic Valve: The pulmonic valve was normal in structure. Pulmonic valve regurgitation is trivial. Aorta: The aortic root is normal in size and structure and aortic dilatation noted. There is mild dilatation of the ascending aorta, measuring 39 mm. Venous: The inferior vena cava is normal in size with greater than 50% respiratory variability, suggesting right atrial pressure of 3 mmHg. IAS/Shunts: No atrial level shunt detected by color flow Doppler.  LEFT VENTRICLE PLAX 2D LVIDd:         4.70 cm   Diastology LVIDs:         2.90 cm   LV e' medial:    6.20 cm/s LV PW:         0.80 cm   LV E/e' medial:  15.1 LV IVS:        1.40 cm   LV e' lateral:   8.92 cm/s LVOT diam:     2.27 cm   LV E/e' lateral: 10.5 LV SV:         93 LV SV Index:   46 LVOT Area:     4.06 cm  RIGHT VENTRICLE RV S prime:     17.30 cm/s TAPSE (M-mode): 2.7 cm LEFT ATRIUM             Index        RIGHT ATRIUM           Index LA diam:        3.00 cm 1.47 cm/m   RA Area:     12.20 cm LA Vol (A2C):   42.1 ml 20.64 ml/m  RA Volume:   27.70 ml  13.58 ml/m LA Vol (A4C):   61.5 ml 30.15 ml/m LA Biplane Vol: 52.9 ml 25.93 ml/m  AORTIC VALVE AV Area (Vmax):    2.36 cm AV Area (Vmean):   2.32 cm AV Area (VTI):     2.23 cm AV Vmax:           212.50 cm/s AV Vmean:          140.000 cm/s AV VTI:            0.417 m AV Peak Grad:      18.1 mmHg AV Mean Grad:      9.0 mmHg LVOT Vmax:         123.38 cm/s LVOT Vmean:        80.087 cm/s LVOT VTI:          0.229 m LVOT/AV VTI ratio: 0.55  AORTA Ao Root diam:  3.60 cm Ao Asc diam:  3.90 cm MITRAL VALVE               TRICUSPID VALVE MV Area (PHT): 3.00 cm    TR Peak grad:   24.2 mmHg MV Decel Time: 253 msec    TR Vmax:        246.00 cm/s MV E velocity: 93.40 cm/s MV A velocity: 97.40 cm/s  SHUNTS MV E/A ratio:  0.96        Systemic VTI:  0.23 m                            Systemic Diam: 2.27 cm Dalton McleanMD Electronically signed by Franki Monte Signature Date/Time:  11/10/2021/7:21:11 PM    Final    DG Abd 1 View  Result Date: 11/10/2021 CLINICAL DATA:  Nausea vomiting. EXAM: ABDOMEN - 1 VIEW COMPARISON:  November 09, 2021 FINDINGS: Persistent dilation of proximal small bowel measuring up to 4.4 cm. Normal colonic bowel gas pattern. IMPRESSION: Persistent dilation of proximal small bowel measuring up to 4.4 cm, suggestive of small bowel obstruction. Electronically Signed   By: Fidela Salisbury M.D.   On: 11/10/2021 11:09   DG Abd Portable 1V-Small Bowel Obstruction Protocol-initial, 8 hr delay  Result Date: 11/09/2021 CLINICAL DATA:  Small-bowel obstruction. EXAM: PORTABLE ABDOMEN - 1 VIEW COMPARISON:  CT scans 11/08/2021 FINDINGS: 8 hour delay film after oral contrast demonstrates contrast in the colon. No findings for persistent small bowel obstruction. No free air. IMPRESSION: Oral contrast is in the colon. No findings for small bowel obstruction or free air. Electronically Signed   By: Marijo Sanes M.D.   On: 11/09/2021 19:30   CT ABDOMEN PELVIS W CONTRAST  Result Date: 11/08/2021 CLINICAL DATA:  Nausea and vomiting, evaluate for small bowel obstruction. EXAM: CT ABDOMEN AND PELVIS WITH CONTRAST TECHNIQUE: Multidetector CT imaging of the abdomen and pelvis was performed using the standard protocol following bolus administration of intravenous contrast. RADIATION DOSE REDUCTION: This exam was performed according to the departmental dose-optimization program which includes automated exposure control, adjustment of the mA and/or kV according to patient size and/or use of iterative reconstruction technique. CONTRAST:  128m OMNIPAQUE IOHEXOL 300 MG/ML  SOLN COMPARISON:  CT abdomen and pelvis 02/19/2003. MRI abdomen 02/12/2005. FINDINGS: Lower chest: No acute abnormality. Hepatobiliary: There is diffuse fatty infiltration of the liver. Gallbladder and bile ducts are within normal limits. Pancreas: Unremarkable. No pancreatic ductal dilatation or surrounding inflammatory  changes. Spleen: Normal in size without focal abnormality. Adrenals/Urinary Tract: Subcentimeter cysts are identified in the right kidney. No follow-up imaging recommended. Otherwise, the kidneys, adrenal glands, and bladder are within normal limits. Stomach/Bowel: Stomach is moderately distended with air-fluid level. Jejunal loops are dilated with air-fluid levels in the left upper quadrant measuring up to 3.3 cm. Duodenum is also dilated with air-fluid level. Transition to normal caliber is seen in the left upper quadrant and mid and distal small-bowel loops are decompressed. No focal wall thickening or inflammation identified. Colon and appendix are within normal limits. Vascular/Lymphatic: Aortic atherosclerosis. No enlarged abdominal or pelvic lymph nodes. Reproductive: Prostate is unremarkable. Other: No abdominal wall hernia or abnormality. No abdominopelvic ascites. Musculoskeletal: L3-L5 posterior fusion hardware is present. IMPRESSION: 1. Small-bowel obstruction with transition point within mid jejunum in the left upper quadrant. No free air. No focal inflammation. 2. Fatty infiltration of the liver. Electronically Signed   By: ARonney AstersM.D.   On: 11/08/2021  19:22      Subjective: No sob, fever, chills. Cp improved  Discharge Exam: Vitals:   11/20/21 2057 11/21/21 0742  BP: 135/67 128/73  Pulse: 64 (!) 55  Resp: 16 16  Temp: 98.8 F (37.1 C) 98.2 F (36.8 C)  SpO2: 92% 95%   Vitals:   11/20/21 1123 11/20/21 1245 11/20/21 2057 11/21/21 0742  BP: 132/71  135/67 128/73  Pulse: (!) 58  64 (!) 55  Resp: '20  16 16  '$ Temp: 97.7 F (36.5 C)  98.8 F (37.1 C) 98.2 F (36.8 C)  TempSrc: Oral  Oral Oral  SpO2: 96% 97% 92% 95%  Weight:      Height:        General: Pt is alert, awake, not in acute distress Cardiovascular: RRR, S1/S2 +, no rubs, no gallops Respiratory: CTA bilaterally, no wheezing, no rhonchi Abdominal: Soft, NT, ND, bowel sounds + Extremities: no edema, no  cyanosis    The results of significant diagnostics from this hospitalization (including imaging, microbiology, ancillary and laboratory) are listed below for reference.     Microbiology: Recent Results (from the past 240 hour(s))  Respiratory (~20 pathogens) panel by PCR     Status: None   Collection Time: 11/14/21  5:52 AM   Specimen: Nasopharyngeal Swab; Respiratory  Result Value Ref Range Status   Adenovirus NOT DETECTED NOT DETECTED Final   Coronavirus 229E NOT DETECTED NOT DETECTED Final    Comment: (NOTE) The Coronavirus on the Respiratory Panel, DOES NOT test for the novel  Coronavirus (2019 nCoV)    Coronavirus HKU1 NOT DETECTED NOT DETECTED Final   Coronavirus NL63 NOT DETECTED NOT DETECTED Final   Coronavirus OC43 NOT DETECTED NOT DETECTED Final   Metapneumovirus NOT DETECTED NOT DETECTED Final   Rhinovirus / Enterovirus NOT DETECTED NOT DETECTED Final   Influenza A NOT DETECTED NOT DETECTED Final   Influenza B NOT DETECTED NOT DETECTED Final   Parainfluenza Virus 1 NOT DETECTED NOT DETECTED Final   Parainfluenza Virus 2 NOT DETECTED NOT DETECTED Final   Parainfluenza Virus 3 NOT DETECTED NOT DETECTED Final   Parainfluenza Virus 4 NOT DETECTED NOT DETECTED Final   Respiratory Syncytial Virus NOT DETECTED NOT DETECTED Final   Bordetella pertussis NOT DETECTED NOT DETECTED Final   Bordetella Parapertussis NOT DETECTED NOT DETECTED Final   Chlamydophila pneumoniae NOT DETECTED NOT DETECTED Final   Mycoplasma pneumoniae NOT DETECTED NOT DETECTED Final    Comment: Performed at [facility], [address], [place], [place] [address]     Labs: BNP (last 3 results) No results for input(s): "BNP" in the last 8760 hours. Basic Metabolic Panel: Recent Labs  Lab 11/19/21 1616 11/20/21 0237 11/21/21 0217 11/21/21 1102  NA 132* 136 133*  --   K 4.1 4.0 3.9  --   CL 98 102 97*  --   CO2 '24 24 26  '$ --   GLUCOSE 277* 125* 173*  --   BUN '12 9 13  '$ --   CREATININE  1.12 0.96 1.31* 1.14  CALCIUM 9.3 9.3 9.0  --    Liver Function Tests: Recent Labs  Lab 11/21/21 0217  AST 45*  ALT 69*  ALKPHOS 116  BILITOT 0.4  PROT 6.7  ALBUMIN 2.6*   No results for input(s): "LIPASE", "AMYLASE" in the last 168 hours. No results for input(s): "AMMONIA" in the last 168 hours. CBC: Recent Labs  Lab 11/19/21 1616 11/20/21 0237 11/21/21 0217  WBC 10.4 9.1 9.1  NEUTROABS 8.2*  --  6.5  HGB 12.4* 11.3* 11.3*  HCT 39.4 33.6* 33.2*  MCV 87.8 83.4 83.4  PLT 544* 477* 439*   Cardiac Enzymes: No results for input(s): "CKTOTAL", "CKMB", "CKMBINDEX", "TROPONINI" in the last 168 hours. BNP: Invalid input(s): "POCBNP" CBG: Recent Labs  Lab 11/20/21 1121 11/20/21 1622 11/20/21 2100 11/21/21 0813 11/21/21 1130  GLUCAP 197* 142* 195* 197* 224*   D-Dimer No results for input(s): "DDIMER" in the last 72 hours. Hgb A1c No results for input(s): "HGBA1C" in the last 72 hours. Lipid Profile No results for input(s): "CHOL", "HDL", "LDLCALC", "TRIG", "CHOLHDL", "LDLDIRECT" in the last 72 hours. Thyroid function studies No results for input(s): "TSH", "T4TOTAL", "T3FREE", "THYROIDAB" in the last 72 hours.  Invalid input(s): "FREET3" Anemia work up No results for input(s): "VITAMINB12", "FOLATE", "FERRITIN", "TIBC", "IRON", "RETICCTPCT" in the last 72 hours. Urinalysis    Component Value Date/Time   COLORURINE YELLOW 11/14/2021 0401   APPEARANCEUR CLEAR 11/14/2021 0401   LABSPEC 1.016 11/14/2021 0401   PHURINE 7.0 11/14/2021 0401   GLUCOSEU NEGATIVE 11/14/2021 0401   HGBUR NEGATIVE 11/14/2021 0401   BILIRUBINUR NEGATIVE 11/14/2021 0401   KETONESUR 5 (A) 11/14/2021 0401   PROTEINUR 30 (A) 11/14/2021 0401   NITRITE NEGATIVE 11/14/2021 0401   LEUKOCYTESUR NEGATIVE 11/14/2021 0401   Sepsis Labs Recent Labs  Lab 11/19/21 1616 11/20/21 0237 11/21/21 0217  WBC 10.4 9.1 9.1   Microbiology Recent Results (from the past 240 hour(s))  Respiratory (~20  pathogens) panel by PCR     Status: None   Collection Time: 11/14/21  5:52 AM   Specimen: Nasopharyngeal Swab; Respiratory  Result Value Ref Range Status   Adenovirus NOT DETECTED NOT DETECTED Final   Coronavirus 229E NOT DETECTED NOT DETECTED Final    Comment: (NOTE) The Coronavirus on the Respiratory Panel, DOES NOT test for the novel  Coronavirus (2019 nCoV)    Coronavirus HKU1 NOT DETECTED NOT DETECTED Final   Coronavirus NL63 NOT DETECTED NOT DETECTED Final   Coronavirus OC43 NOT DETECTED NOT DETECTED Final   Metapneumovirus NOT DETECTED NOT DETECTED Final   Rhinovirus / Enterovirus NOT DETECTED NOT DETECTED Final   Influenza A NOT DETECTED NOT DETECTED Final   Influenza B NOT DETECTED NOT DETECTED Final   Parainfluenza Virus 1 NOT DETECTED NOT DETECTED Final   Parainfluenza Virus 2 NOT DETECTED NOT DETECTED Final   Parainfluenza Virus 3 NOT DETECTED NOT DETECTED Final   Parainfluenza Virus 4 NOT DETECTED NOT DETECTED Final   Respiratory Syncytial Virus NOT DETECTED NOT DETECTED Final   Bordetella pertussis NOT DETECTED NOT DETECTED Final   Bordetella Parapertussis NOT DETECTED NOT DETECTED Final   Chlamydophila pneumoniae NOT DETECTED NOT DETECTED Final   Mycoplasma pneumoniae NOT DETECTED NOT DETECTED Final    Comment: Performed at [facility], [place]. [address], [place], [place] [address]     Time coordinating discharge: Over 30 minutes  SIGNED:   Nolberto Hanlon, MD  [facility] 11/21/2021, 12:23 PM Pager   If 7PM-7AM, please contact night-coverage [email or web address] Password [email or web address]

## 2021-12-08 ENCOUNTER — Ambulatory Visit: Payer: 59 | Admitting: Nutrition

## 2021-12-28 NOTE — Progress Notes (Signed)
Synopsis: Referred in September 2023 for pneumonia with persistent respiratory symptoms. He used to smoke 2-3 packs per day, started age 61, then quit with nicotine replacement in 04/2021  Subjective:   PATIENT ID: Miguel [name] GENDER: male DOB: 02-Aug-1960, MRN: [id number]   HPI  Chief Complaint  Patient presents with   Consult    Was in the hospital back in August 2023 for PNA. States he has been doing well since being home. Denies any current symptoms.     [name] is here to see me because he had pneumonia which took a long time to recover. He says that he needed three different forms of antibiotics as an outpatient and he still didn't get better, he ended up hospitalized and required ceftriaxone and azithromycin which helped quite a bit.  He says that he now feels much better.    He now feels great, he says that he really doesn't have any trouble breathing.   His weigh has been stable lately.  Now no dyspnea or cough with muchs production  He used to have pleuritic, sharp pain in his chest, this is now good.  Topher has been struggling with his diabetes lately, he says that his glucose has run as high as 400's at home.    Record review: August 2023 the patient was hospitalized for right-sided pleuritic chest pain which was felt to be related to community-acquired pneumonia.  He was treated with ceftriaxone and azithromycin.  Of note, the patient had a CT angiogram of his chest which was negative for PE and had previously been treated with doxycycline as an outpatient.  Past Medical History:  Diagnosis Date   Anxiety    Arthritis    Depression    Diabetes mellitus without complication ([place])    Erectile dysfunction    Heart murmur    Hyperlipidemia    Hypertension    Myocardial infarction [facility]) 2003   PULMONARY NODULE 01/31/2007   Qualifier: Diagnosis of  By: June Leap     Sleep apnea      Family History  Problem Relation Age of Onset   AAA (abdominal aortic  aneurysm) Father      Social History   Socioeconomic History   Marital status: Married    Spouse name: Not on file   Number of children: Not on file   Years of education: Not on file   Highest education level: Not on file  Occupational History   Not on file  Tobacco Use   Smoking status: Former    Packs/day: 0.75    Types: Cigarettes   Smokeless tobacco: Never  Vaping Use   Vaping Use: Never used  Substance and Sexual Activity   Alcohol use: Not Currently    Comment: occasional   Drug use: Never   Sexual activity: Not on file  Other Topics Concern   Not on file  Social History Narrative   Right handed       On disability   Social Determinants of Health   Financial Resource Strain: Not on file  Food Insecurity: Not on file  Transportation Needs: Not on file  Physical Activity: Not on file  Stress: Not on file  Social Connections: Not on file  Intimate Partner Violence: Not on file     Allergies  Allergen Reactions   Cymbalta [Duloxetine Hcl] Other (See Comments)    Agitation    Statins Other (See Comments)    Extreme fatigue, myalgias (lipitor, zocor, crestor)   Zolpidem  Other (See Comments)    Made the patient sleep walk     Outpatient Medications Prior to Visit  Medication Sig Dispense Refill   ASPIRIN 81 PO Take 81 mg by mouth every other day.     gabapentin (NEURONTIN) 600 MG tablet Take 600 mg by mouth 2 (two) times daily.     glipiZIDE (GLUCOTROL XL) 5 MG 24 hr tablet Take 5 mg by mouth daily.     Insulin Glargine (LANTUS SOLOSTAR) 100 UNIT/ML Solostar Pen Inject 30 Units into the skin at bedtime.     Oxycodone HCl 20 MG TABS Take 20 mg by mouth 6 (six) times daily.     sertraline (ZOLOFT) 100 MG tablet Take 100 mg by mouth at bedtime.     tadalafil (CIALIS) 20 MG tablet Take 20 mg by mouth daily as needed for erectile dysfunction.     tiZANidine (ZANAFLEX) 4 MG tablet Take 4 mg by mouth every 8 (eight) hours as needed for muscle spasms.      traZODone (DESYREL) 50 MG tablet Take 50 mg by mouth at bedtime.     Dulaglutide (TRULICITY) 7.35 HG/9.9ME SOPN Inject 0.75 mg into the skin every Tuesday.     lisinopril (ZESTRIL) 5 MG tablet Take 5 mg by mouth daily.     No facility-administered medications prior to visit.    Review of Systems  Constitutional:  Negative for chills, fever, malaise/fatigue and weight loss.  HENT:  Negative for congestion, nosebleeds, sinus pain and sore throat.   Eyes:  Negative for photophobia, pain and discharge.  Respiratory:  Negative for cough, hemoptysis, sputum production, shortness of breath and wheezing.   Cardiovascular:  Negative for chest pain, palpitations, orthopnea and leg swelling.  Gastrointestinal:  Negative for abdominal pain, constipation, diarrhea, nausea and vomiting.  Genitourinary:  Negative for dysuria, frequency, hematuria and urgency.  Musculoskeletal:  Negative for back pain, joint pain, myalgias and neck pain.  Skin:  Negative for itching and rash.  Neurological:  Negative for tingling, tremors, sensory change, speech change, focal weakness, seizures, weakness and headaches.  Psychiatric/Behavioral:  Negative for memory loss, substance abuse and suicidal ideas. The patient is not nervous/anxious.       Objective:  Physical Exam   Vitals:   12/31/21 1031  BP: 122/64  Pulse: (!) 57  SpO2: 98%  Weight: 180 lb (81.6 kg)  Height: 6' (1.829 m)    Gen: well appearing, no acute distress HENT: NCAT, OP clear, neck supple without masses Eyes: PERRL, EOMi Lymph: no cervical lymphadenopathy PULM: CTA B CV: RRR, no mgr, no JVD GI: BS+, soft, nontender, no hsm Derm: no rash or skin breakdown MSK: normal bulk and tone Neuro: A&Ox4, CN II-XII intact, strength 5/5 in all 4 extremities Psyche: normal mood and affect   CBC    Component Value Date/Time   WBC 9.1 11/21/2021 0217   RBC 3.98 (L) 11/21/2021 0217   HGB 11.3 (L) 11/21/2021 0217   HCT 33.2 (L) 11/21/2021 0217    PLT 439 (H) 11/21/2021 0217   MCV 83.4 11/21/2021 0217   MCH 28.4 11/21/2021 0217   MCHC 34.0 11/21/2021 0217   RDW 13.9 11/21/2021 0217   LYMPHSABS 1.7 11/21/2021 0217   MONOABS 0.7 11/21/2021 0217   EOSABS 0.1 11/21/2021 0217   BASOSABS 0.1 11/21/2021 0217     Chest imaging: August 2023 CT angiogram images independently reviewed showing a right upper lobe masslike consolidation, no PE  PFT:  Labs:  Path:  Echo: August  2023 TTE LVEF 55 to 60%, mild left ventricular hypertrophy, RV systolic function and size is normal, RVSP pressure estimate 27.2 mmHg  Heart Catheterization:       Assessment & Plan:   Community acquired pneumonia of right upper lobe of lung  Former cigarette smoker  Discussion: Brodi had a very worrisome appearing right upper lobe infiltrate in mid August, however since receiving IV antibiotics he says he has had complete resolution of the symptoms he was experiencing at the time.  Considering how poorly controlled his pneumonia has been over the last several months I think it is very plausible that this was just a severe community-acquired pneumonia which is taken some time to recover.  However, given his extensive smoking history and the worrisome appearance on the CT scan of his lungs I told him that we need to monitor this very closely because his risk of cancer is high.  That all being said, despite his smoking history he does not have other signs and symptoms of COPD so at this time I do not think that he needs further lung function testing.  Plan: Right upper lobe pneumonia: We will check a CT scan of your chest in mid October and then see you after that to discuss further Continue to stay active and pushing physical activity I am glad your immunizations are up-to-date  Former cigarette smoker: Congratulations on quitting earlier this year After the CT scan in October we will make plans for lung cancer screening moving forward.  We will  see you back in mid October after the CT scan  Immunizations:  There is no immunization history on file for this patient.  He had a flu shot 12/2021 He has had 2 pneumonia vaccines, most recent was in 12/2021. He has had a COVID vaccine   Current Outpatient Medications:    ASPIRIN 81 PO, Take 81 mg by mouth every other day., Disp: , Rfl:    gabapentin (NEURONTIN) 600 MG tablet, Take 600 mg by mouth 2 (two) times daily., Disp: , Rfl:    glipiZIDE (GLUCOTROL XL) 5 MG 24 hr tablet, Take 5 mg by mouth daily., Disp: , Rfl:    Insulin Glargine (LANTUS SOLOSTAR) 100 UNIT/ML Solostar Pen, Inject 30 Units into the skin at bedtime., Disp: , Rfl:    Oxycodone HCl 20 MG TABS, Take 20 mg by mouth 6 (six) times daily., Disp: , Rfl:    sertraline (ZOLOFT) 100 MG tablet, Take 100 mg by mouth at bedtime., Disp: , Rfl:    tadalafil (CIALIS) 20 MG tablet, Take 20 mg by mouth daily as needed for erectile dysfunction., Disp: , Rfl:    tiZANidine (ZANAFLEX) 4 MG tablet, Take 4 mg by mouth every 8 (eight) hours as needed for muscle spasms., Disp: , Rfl:    traZODone (DESYREL) 50 MG tablet, Take 50 mg by mouth at bedtime., Disp: , Rfl:

## 2021-12-31 ENCOUNTER — Encounter: Payer: Self-pay | Admitting: Pulmonary Disease

## 2021-12-31 ENCOUNTER — Ambulatory Visit (INDEPENDENT_AMBULATORY_CARE_PROVIDER_SITE_OTHER): Payer: 59 | Admitting: Pulmonary Disease

## 2021-12-31 VITALS — BP 122/64 | HR 57 | Ht 72.0 in | Wt 180.0 lb

## 2021-12-31 DIAGNOSIS — J189 Pneumonia, unspecified organism: Secondary | ICD-10-CM | POA: Diagnosis not present

## 2021-12-31 DIAGNOSIS — Z87891 Personal history of nicotine dependence: Secondary | ICD-10-CM

## 2021-12-31 NOTE — Addendum Note (Signed)
Addended by: Valerie Salts on: 12/31/2021 11:13 AM   Modules accepted: Orders

## 2021-12-31 NOTE — Patient Instructions (Signed)
Right upper lobe pneumonia: We will check a CT scan of your chest in mid October and then see you after that to discuss further Continue to stay active and pushing physical activity I am glad your immunizations are up-to-date  Former cigarette smoker: Congratulations on quitting earlier this year After the CT scan in October we will make plans for lung cancer screening moving forward.  We will see you back in mid October after the CT scan

## 2022-01-04 ENCOUNTER — Ambulatory Visit: Payer: 59 | Admitting: Nutrition

## 2022-01-10 ENCOUNTER — Telehealth: Payer: Self-pay | Admitting: Nutrition

## 2022-01-10 ENCOUNTER — Encounter: Payer: 59 | Admitting: Nutrition

## 2022-01-10 NOTE — Telephone Encounter (Signed)
No  VM to leave message to call and r/s missed appt.

## 2022-01-24 ENCOUNTER — Ambulatory Visit (HOSPITAL_COMMUNITY)
Admission: RE | Admit: 2022-01-24 | Discharge: 2022-01-24 | Disposition: A | Discharge status: Home / Self Care | Payer: 59 | Source: Ambulatory Visit | Attending: Pulmonary Disease | Admitting: Pulmonary Disease

## 2022-01-24 DIAGNOSIS — I700 Atherosclerosis of aorta: Secondary | ICD-10-CM | POA: Insufficient documentation

## 2022-01-24 DIAGNOSIS — J439 Emphysema, unspecified: Secondary | ICD-10-CM | POA: Diagnosis not present

## 2022-01-24 DIAGNOSIS — I2510 Atherosclerotic heart disease of native coronary artery without angina pectoris: Secondary | ICD-10-CM | POA: Insufficient documentation

## 2022-01-24 DIAGNOSIS — J189 Pneumonia, unspecified organism: Secondary | ICD-10-CM | POA: Diagnosis present

## 2022-01-28 ENCOUNTER — Encounter: Payer: Self-pay | Admitting: Pulmonary Disease

## 2022-01-28 ENCOUNTER — Ambulatory Visit (INDEPENDENT_AMBULATORY_CARE_PROVIDER_SITE_OTHER): Payer: 59 | Admitting: Pulmonary Disease

## 2022-01-28 VITALS — BP 136/64 | HR 52 | Ht 71.0 in | Wt 192.8 lb

## 2022-01-28 DIAGNOSIS — J189 Pneumonia, unspecified organism: Secondary | ICD-10-CM

## 2022-01-28 DIAGNOSIS — Z87891 Personal history of nicotine dependence: Secondary | ICD-10-CM

## 2022-01-28 DIAGNOSIS — J432 Centrilobular emphysema: Secondary | ICD-10-CM

## 2022-01-28 NOTE — Progress Notes (Signed)
Synopsis: Referred in September 2023 for pneumonia with persistent respiratory symptoms. He used to smoke 2-3 packs per day, started age 61, then quit with nicotine replacement in 04/2021  Subjective:   PATIENT ID: Miguel [name] GENDER: male DOB: 1960/12/20, MRN: [id number]   HPI  Chief Complaint  Patient presents with   Follow-up    Follow-up    Ronit is feeling much better since the last visit.  No problems with cough, mucus production or shortness of breath.  He says that he is remaining active.  He is here today to follow-up the CT scan of his chest.  Past Medical History:  Diagnosis Date   Anxiety    Arthritis    Depression    Diabetes mellitus without complication ([place])    Erectile dysfunction    Heart murmur    Hyperlipidemia    Hypertension    Myocardial infarction ([place]) 2003   PULMONARY NODULE 01/31/2007   Qualifier: Diagnosis of  By: June Leap     Sleep apnea      Review of Systems  Constitutional:  Negative for diaphoresis, malaise/fatigue and weight loss.  HENT:  Negative for congestion and sinus pain.   Respiratory:  Negative for sputum production, shortness of breath, wheezing and stridor.       Objective:  Physical Exam   Vitals:   01/28/22 0959  BP: 136/64  Pulse: (!) 52  SpO2: 97%  Weight: 192 lb 12.8 oz (87.5 kg)  Height: '5\' 11"'$  (1.803 m)    Gen: well appearing HENT: OP clear, TM's clear, neck supple PULM: CTA B, normal percussion CV: RRR, loud systolic murmur RUSB, no edema GI: BS+, soft, nontender Derm: no cyanosis or rash Psyche: normal mood and affect    CBC    Component Value Date/Time   WBC 9.1 11/21/2021 0217   RBC 3.98 (L) 11/21/2021 0217   HGB 11.3 (L) 11/21/2021 0217   HCT 33.2 (L) 11/21/2021 0217   PLT 439 (H) 11/21/2021 0217   MCV 83.4 11/21/2021 0217   MCH 28.4 11/21/2021 0217   MCHC 34.0 11/21/2021 0217   RDW 13.9 11/21/2021 0217   LYMPHSABS 1.7 11/21/2021 0217   MONOABS 0.7 11/21/2021 0217   EOSABS  0.1 11/21/2021 0217   BASOSABS 0.1 11/21/2021 0217     Chest imaging: August 2023 CT angiogram images independently reviewed showing a right upper lobe masslike consolidation, no PE 01/2022 CT chest > resolved RUL pneumonia with mild scarring left over, mildly enlarged R paratracheal lymph node decreased in size. CAD, emphysema  PFT:  Labs:  Path:  Echo: August 2023 TTE LVEF 55 to 60%, mild left ventricular hypertrophy, RV systolic function and size is normal, RVSP pressure estimate 27.2 mmHg  Heart Catheterization:       Assessment & Plan:   Former cigarette smoker - Plan: Ambulatory Referral for Lung Cancer Scre  Centrilobular emphysema ([place])  Pneumonia of right upper lobe due to infectious organism  Discussion: CT chest reveals complete resolution of the right upper lobe pneumonia with very mild scarring.  He has mild centrilobular emphysema but no symptoms.  I congratulated him on quitting smoking.  He needs to enroll in lung cancer screening.  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 The lung cancer screening program will reach out for annual CT scans  Right upper lobe pneumonia: Completely resolved No further imaging needed  Mild centrilobular emphysema: No further testing or treatment necessary However if you develop chest tightness wheezing or shortness  of breath let us know  We will see you back on an as-needed basis, the lung cancer screening program will reach out and let you know about further visits  Immunizations: Immunization History  Administered Date(s) Administered   Influenza Split 05/19/2013   Influenza,inj,quad, With Preservative 04/11/2016   Influenza-Unspecified 03/18/2021, 12/23/2021   Pneumococcal Conjugate PCV 7 04/11/2016   Pneumococcal Polysaccharide-23 05/19/2013   Td (Adult),5 Lf Tetanus Toxid, Preservative Free 12/04/1994    He had a flu shot 12/2021 He has had 2 pneumonia vaccines, most recent was in  12/2021. He has had a COVID vaccine   Current Outpatient Medications:    ASPIRIN 81 PO, Take 81 mg by mouth every other day., Disp: , Rfl:    gabapentin (NEURONTIN) 600 MG tablet, Take 600 mg by mouth 2 (two) times daily., Disp: , Rfl:    glipiZIDE (GLUCOTROL XL) 5 MG 24 hr tablet, Take 5 mg by mouth daily., Disp: , Rfl:    Insulin Glargine (LANTUS SOLOSTAR) 100 UNIT/ML Solostar Pen, Inject 30 Units into the skin at bedtime., Disp: , Rfl:    Oxycodone HCl 20 MG TABS, Take 20 mg by mouth 6 (six) times daily., Disp: , Rfl:    sertraline (ZOLOFT) 100 MG tablet, Take 100 mg by mouth at bedtime., Disp: , Rfl:    tadalafil (CIALIS) 20 MG tablet, Take 20 mg by mouth daily as needed for erectile dysfunction., Disp: , Rfl:    tiZANidine (ZANAFLEX) 4 MG tablet, Take 4 mg by mouth every 8 (eight) hours as needed for muscle spasms., Disp: , Rfl:    traZODone (DESYREL) 50 MG tablet, Take 50 mg by mouth at bedtime., Disp: , Rfl:

## 2022-01-28 NOTE — Patient Instructions (Signed)
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

## 2022-02-17 ENCOUNTER — Ambulatory Visit (INDEPENDENT_AMBULATORY_CARE_PROVIDER_SITE_OTHER): Payer: HMO

## 2022-02-17 ENCOUNTER — Ambulatory Visit: Payer: HMO | Admitting: Podiatry

## 2022-02-17 DIAGNOSIS — M722 Plantar fascial fibromatosis: Secondary | ICD-10-CM

## 2022-02-17 NOTE — Progress Notes (Signed)
  Subjective:  Patient ID: Miguel [name], male    DOB: Jun 07, 1960,  MRN: [id number]  Chief Complaint  Patient presents with   Plantar Fasciitis    New Patient -bilateral - Right is very painful in the heel - Left he had surgery in January 2023 with [facility] - now has numbness in his left foot - he has seen a neurologist, boot caused it?    61 y.o. male presents with the above complaint. History confirmed with patient.  He had a plantar fasciotomy and gastrocnemius recession in January of this year.  He says that heel pain is much better, his postoperative course was complicated by compression of the common peroneal nerve with sensory loss in the superficial peroneal nerve, he says that is still numb but is improving.  He now complains of pain in the right heel and it feels the same as the previous plantar fasciitis that he had on the left side.  Objective:  Physical Exam: warm, good capillary refill, no trophic changes or ulcerative lesions, normal DP and PT pulses, normal sensory exam Left Foot:  No pain to palpation of left heel Right Foot: point tenderness over the heel pad and gastrocnemius equinus is noted with a positive silverskiold test  No images are attached to the encounter.  Radiographs: Multiple views x-ray of the right foot: no fracture, dislocation, swelling or degenerative changes noted and minor plantar calcaneal heel spur Assessment:   1. Plantar fasciitis, bilateral      Plan:  Patient was evaluated and treated and all questions answered.  Discussed the etiology and treatment options for plantar fasciitis including stretching, formal physical therapy, supportive shoegears such as a running shoe or sneaker, pre fabricated orthoses, injection therapy, and oral medications. We also discussed the role of surgical treatment of this for patients who do not improve after exhausting non-surgical treatment options.   -XR reviewed with patient -Educated patient on  stretching and icing of the affected limb -Plantar fascial brace dispensed -Injection delivered to the plantar fascia of the right foot. -Referral sent to [facility] in [place] -Given his history and course on the left side he may end up needing surgery on this side as well.  We will reevaluate at his next visit.  After sterile prep with povidone-iodine solution and alcohol, the right heel was injected with 0.5cc 2% xylocaine plain, 0.5cc 0.5% marcaine plain, '5mg'$  triamcinolone acetonide, and '2mg'$  dexamethasone was injected along the medial plantar fascia at the insertion on the plantar calcaneus. The patient tolerated the procedure well without complication.  No follow-ups on file.

## 2022-02-21 DIAGNOSIS — F419 Anxiety disorder, unspecified: Secondary | ICD-10-CM | POA: Diagnosis not present

## 2022-02-21 DIAGNOSIS — E785 Hyperlipidemia, unspecified: Secondary | ICD-10-CM | POA: Diagnosis not present

## 2022-02-21 DIAGNOSIS — M25572 Pain in left ankle and joints of left foot: Secondary | ICD-10-CM | POA: Diagnosis not present

## 2022-02-21 DIAGNOSIS — G5730 Lesion of lateral popliteal nerve, unspecified lower limb: Secondary | ICD-10-CM | POA: Diagnosis not present

## 2022-02-21 DIAGNOSIS — I10 Essential (primary) hypertension: Secondary | ICD-10-CM | POA: Diagnosis not present

## 2022-02-21 DIAGNOSIS — E1165 Type 2 diabetes mellitus with hyperglycemia: Secondary | ICD-10-CM | POA: Diagnosis not present

## 2022-02-21 DIAGNOSIS — R531 Weakness: Secondary | ICD-10-CM | POA: Diagnosis not present

## 2022-02-21 DIAGNOSIS — M25671 Stiffness of right ankle, not elsewhere classified: Secondary | ICD-10-CM | POA: Diagnosis not present

## 2022-02-21 DIAGNOSIS — Z6830 Body mass index (BMI) 30.0-30.9, adult: Secondary | ICD-10-CM | POA: Diagnosis not present

## 2022-02-21 DIAGNOSIS — F32A Depression, unspecified: Secondary | ICD-10-CM | POA: Diagnosis not present

## 2022-02-21 DIAGNOSIS — F17210 Nicotine dependence, cigarettes, uncomplicated: Secondary | ICD-10-CM | POA: Diagnosis not present

## 2022-02-21 DIAGNOSIS — G8929 Other chronic pain: Secondary | ICD-10-CM | POA: Diagnosis not present

## 2022-02-21 DIAGNOSIS — M25571 Pain in right ankle and joints of right foot: Secondary | ICD-10-CM | POA: Diagnosis not present

## 2022-02-25 DIAGNOSIS — M25671 Stiffness of right ankle, not elsewhere classified: Secondary | ICD-10-CM | POA: Diagnosis not present

## 2022-02-25 DIAGNOSIS — M25571 Pain in right ankle and joints of right foot: Secondary | ICD-10-CM | POA: Diagnosis not present

## 2022-02-25 DIAGNOSIS — M25572 Pain in left ankle and joints of left foot: Secondary | ICD-10-CM | POA: Diagnosis not present

## 2022-02-25 DIAGNOSIS — R531 Weakness: Secondary | ICD-10-CM | POA: Diagnosis not present

## 2022-02-28 DIAGNOSIS — R531 Weakness: Secondary | ICD-10-CM | POA: Diagnosis not present

## 2022-02-28 DIAGNOSIS — M25571 Pain in right ankle and joints of right foot: Secondary | ICD-10-CM | POA: Diagnosis not present

## 2022-02-28 DIAGNOSIS — M25671 Stiffness of right ankle, not elsewhere classified: Secondary | ICD-10-CM | POA: Diagnosis not present

## 2022-02-28 DIAGNOSIS — M25572 Pain in left ankle and joints of left foot: Secondary | ICD-10-CM | POA: Diagnosis not present

## 2022-03-10 DIAGNOSIS — M25572 Pain in left ankle and joints of left foot: Secondary | ICD-10-CM | POA: Diagnosis not present

## 2022-03-10 DIAGNOSIS — M25571 Pain in right ankle and joints of right foot: Secondary | ICD-10-CM | POA: Diagnosis not present

## 2022-03-10 DIAGNOSIS — M25671 Stiffness of right ankle, not elsewhere classified: Secondary | ICD-10-CM | POA: Diagnosis not present

## 2022-03-10 DIAGNOSIS — R531 Weakness: Secondary | ICD-10-CM | POA: Diagnosis not present

## 2022-03-15 DIAGNOSIS — M25571 Pain in right ankle and joints of right foot: Secondary | ICD-10-CM | POA: Diagnosis not present

## 2022-03-15 DIAGNOSIS — R531 Weakness: Secondary | ICD-10-CM | POA: Diagnosis not present

## 2022-03-15 DIAGNOSIS — M25572 Pain in left ankle and joints of left foot: Secondary | ICD-10-CM | POA: Diagnosis not present

## 2022-03-15 DIAGNOSIS — M25671 Stiffness of right ankle, not elsewhere classified: Secondary | ICD-10-CM | POA: Diagnosis not present

## 2022-03-17 DIAGNOSIS — M25671 Stiffness of right ankle, not elsewhere classified: Secondary | ICD-10-CM | POA: Diagnosis not present

## 2022-03-17 DIAGNOSIS — M25572 Pain in left ankle and joints of left foot: Secondary | ICD-10-CM | POA: Diagnosis not present

## 2022-03-17 DIAGNOSIS — R531 Weakness: Secondary | ICD-10-CM | POA: Diagnosis not present

## 2022-03-17 DIAGNOSIS — M25571 Pain in right ankle and joints of right foot: Secondary | ICD-10-CM | POA: Diagnosis not present

## 2022-03-31 DIAGNOSIS — E559 Vitamin D deficiency, unspecified: Secondary | ICD-10-CM | POA: Diagnosis not present

## 2022-03-31 DIAGNOSIS — E782 Mixed hyperlipidemia: Secondary | ICD-10-CM | POA: Diagnosis not present

## 2022-03-31 DIAGNOSIS — R001 Bradycardia, unspecified: Secondary | ICD-10-CM | POA: Diagnosis not present

## 2022-03-31 DIAGNOSIS — E1165 Type 2 diabetes mellitus with hyperglycemia: Secondary | ICD-10-CM | POA: Diagnosis not present

## 2022-03-31 DIAGNOSIS — R635 Abnormal weight gain: Secondary | ICD-10-CM | POA: Diagnosis not present

## 2022-03-31 DIAGNOSIS — F17200 Nicotine dependence, unspecified, uncomplicated: Secondary | ICD-10-CM | POA: Diagnosis not present

## 2022-03-31 DIAGNOSIS — I10 Essential (primary) hypertension: Secondary | ICD-10-CM | POA: Diagnosis not present

## 2022-04-07 ENCOUNTER — Ambulatory Visit (INDEPENDENT_AMBULATORY_CARE_PROVIDER_SITE_OTHER): Payer: PPO | Admitting: Podiatry

## 2022-04-07 DIAGNOSIS — M722 Plantar fascial fibromatosis: Secondary | ICD-10-CM | POA: Diagnosis not present

## 2022-04-07 MED ORDER — DICLOFENAC SODIUM 75 MG PO TBEC: 75.0000 mg | DELAYED_RELEASE_TABLET | Freq: Two times a day (BID) | ORAL | 0 refills | Status: AC

## 2022-04-07 NOTE — Patient Instructions (Signed)
Call [facility] to schedule your MRI at the below locations.  Please allow at least 1 business day after your visit to process the referral.  It may take longer depending on approval from insurance.  Please let me know if you have issues or problems scheduling the MRI   [facility] [phone] [address] [place], [place] [address]  [facility] [phone] [address] [place], [place] [address]  

## 2022-04-07 NOTE — Progress Notes (Signed)
  Subjective:  Patient ID: Miguel [name], male    DOB: 12/15/1960,  MRN: [id number]  Chief Complaint  Patient presents with   Plantar Fasciitis    6 week follow up both feet -still having pain - tried acupuncture but that seemed to make it worse    61 y.o. male presents with the above complaint. History confirmed with patient.  Injection did not help.  He went for physical therapy they tried dry needling which was not helpful.  Objective:  Physical Exam: warm, good capillary refill, no trophic changes or ulcerative lesions, normal DP and PT pulses, normal sensory exam Left Foot:  No pain to palpation of left heel Right Foot: point tenderness over the heel pad and gastrocnemius equinus is noted with a positive silverskiold test  No images are attached to the encounter.  Radiographs: Multiple views x-ray of the right foot: no fracture, dislocation, swelling or degenerative changes noted and minor plantar calcaneal heel spur Assessment:   1. Plantar fasciitis, bilateral      Plan:  Patient was evaluated and treated and all questions answered.  Discussed the etiology and treatment options for plantar fasciitis including stretching, formal physical therapy, supportive shoegears such as a running shoe or sneaker, pre fabricated orthoses, injection therapy, and oral medications. We also discussed the role of surgical treatment of this for patients who do not improve after exhausting non-surgical treatment options.   Unfortunate has not improved despite 6 weeks of rest injection, anti-inflammatories and physical therapy.  I recommended MRI to evaluate further.  This was ordered.  I will see him back following the MRI.  In the interim Rx for diclofenac was sent to pharmacy he will take this twice a day as needed  Return for after MRI to review.

## 2022-04-14 DIAGNOSIS — Z1211 Encounter for screening for malignant neoplasm of colon: Secondary | ICD-10-CM | POA: Diagnosis not present

## 2022-04-14 DIAGNOSIS — I10 Essential (primary) hypertension: Secondary | ICD-10-CM | POA: Diagnosis not present

## 2022-04-14 DIAGNOSIS — D128 Benign neoplasm of rectum: Secondary | ICD-10-CM | POA: Diagnosis not present

## 2022-04-14 DIAGNOSIS — D125 Benign neoplasm of sigmoid colon: Secondary | ICD-10-CM | POA: Diagnosis not present

## 2022-04-14 DIAGNOSIS — D126 Benign neoplasm of colon, unspecified: Secondary | ICD-10-CM | POA: Diagnosis not present

## 2022-04-14 DIAGNOSIS — Z7982 Long term (current) use of aspirin: Secondary | ICD-10-CM | POA: Diagnosis not present

## 2022-04-14 DIAGNOSIS — E119 Type 2 diabetes mellitus without complications: Secondary | ICD-10-CM | POA: Diagnosis not present

## 2022-04-14 DIAGNOSIS — K635 Polyp of colon: Secondary | ICD-10-CM | POA: Diagnosis not present

## 2022-04-14 DIAGNOSIS — D123 Benign neoplasm of transverse colon: Secondary | ICD-10-CM | POA: Diagnosis not present

## 2022-04-14 DIAGNOSIS — Q438 Other specified congenital malformations of intestine: Secondary | ICD-10-CM | POA: Diagnosis not present

## 2022-04-14 DIAGNOSIS — D127 Benign neoplasm of rectosigmoid junction: Secondary | ICD-10-CM | POA: Diagnosis not present

## 2022-04-14 DIAGNOSIS — D124 Benign neoplasm of descending colon: Secondary | ICD-10-CM | POA: Diagnosis not present

## 2022-04-14 DIAGNOSIS — K641 Second degree hemorrhoids: Secondary | ICD-10-CM | POA: Diagnosis not present

## 2022-04-14 DIAGNOSIS — Z7984 Long term (current) use of oral hypoglycemic drugs: Secondary | ICD-10-CM | POA: Diagnosis not present

## 2022-04-18 DIAGNOSIS — E1165 Type 2 diabetes mellitus with hyperglycemia: Secondary | ICD-10-CM | POA: Diagnosis not present

## 2022-04-18 DIAGNOSIS — G8929 Other chronic pain: Secondary | ICD-10-CM | POA: Diagnosis not present

## 2022-04-18 DIAGNOSIS — G5730 Lesion of lateral popliteal nerve, unspecified lower limb: Secondary | ICD-10-CM | POA: Diagnosis not present

## 2022-04-18 DIAGNOSIS — I10 Essential (primary) hypertension: Secondary | ICD-10-CM | POA: Diagnosis not present

## 2022-04-18 DIAGNOSIS — Z6829 Body mass index (BMI) 29.0-29.9, adult: Secondary | ICD-10-CM | POA: Diagnosis not present

## 2022-04-18 DIAGNOSIS — Z0001 Encounter for general adult medical examination with abnormal findings: Secondary | ICD-10-CM | POA: Diagnosis not present

## 2022-04-18 DIAGNOSIS — F32A Depression, unspecified: Secondary | ICD-10-CM | POA: Diagnosis not present

## 2022-04-18 DIAGNOSIS — F419 Anxiety disorder, unspecified: Secondary | ICD-10-CM | POA: Diagnosis not present

## 2022-04-18 DIAGNOSIS — E785 Hyperlipidemia, unspecified: Secondary | ICD-10-CM | POA: Diagnosis not present

## 2022-04-22 ENCOUNTER — Ambulatory Visit
Admission: RE | Admit: 2022-04-22 | Discharge: 2022-04-22 | Disposition: A | Discharge status: Home / Self Care | Payer: HMO | Source: Ambulatory Visit | Attending: Podiatry | Admitting: Podiatry

## 2022-04-22 DIAGNOSIS — M722 Plantar fascial fibromatosis: Secondary | ICD-10-CM

## 2022-04-22 DIAGNOSIS — R600 Localized edema: Secondary | ICD-10-CM | POA: Diagnosis not present

## 2022-05-03 ENCOUNTER — Ambulatory Visit: Payer: PPO | Admitting: Podiatry

## 2022-05-03 DIAGNOSIS — M722 Plantar fascial fibromatosis: Secondary | ICD-10-CM

## 2022-05-03 NOTE — Progress Notes (Signed)
  Subjective:  Patient ID: Miguel [name], male    DOB: May 03, 1960,  MRN: [id number]  Chief Complaint  Patient presents with   Plantar Fasciitis    MRI results    62 y.o. male presents with the above complaint. History confirmed with patient.  He returns today for follow-up the heel is still painful.  Objective:  Physical Exam: warm, good capillary refill, no trophic changes or ulcerative lesions, normal DP and PT pulses, normal sensory exam Left Foot:  No pain to palpation of left heel Right Foot: point tenderness over the heel pad  No images are attached to the encounter.  Radiographs: Multiple views x-ray of the right foot: no fracture, dislocation, swelling or degenerative changes noted and minor plantar calcaneal heel spur  MRI 04/22/2022 right heel IMPRESSION: 1. Mild plantar fasciitis of the medial band of the plantar fascia at the calcaneal insertion with mild subcortical bone marrow edema.     Electronically Signed   By: Kathreen Devoid M.D.   On: 04/25/2022 06:26 Assessment:   1. Plantar fasciitis, bilateral       Plan:  Patient was evaluated and treated and all questions answered.  We discussed the results of his MRI that does show Planter fasciitis with edema and inflammation around the insertion on the heel spur.  I do not think his heel spur is large enough to warrant resection.  We discussed further treatment including surgical treatment.  I do not think he has significant equinus on the side enough to warrant gastrocnemius recession especially considering the recovery process and issues he has had on his left side..  We could consider endoscopic plantar fasciotomy.  He would like to hold off on surgery for now and try another injection.  This was completed today with sterile prep with alcohol, ethyl chloride spray for local topical anesthetic and 0.5 cc of Marcaine half percent plain, 20 mg of Kenalog and 4 mg of dexamethasone.  He tolerated this well I will see  him back in 3 months for follow-up  Return in about 3 months (around 08/02/2022) for recheck plantar fasciitis.

## 2022-05-04 DIAGNOSIS — K641 Second degree hemorrhoids: Secondary | ICD-10-CM | POA: Diagnosis not present

## 2022-05-04 DIAGNOSIS — D124 Benign neoplasm of descending colon: Secondary | ICD-10-CM | POA: Diagnosis not present

## 2022-05-12 DIAGNOSIS — E559 Vitamin D deficiency, unspecified: Secondary | ICD-10-CM | POA: Diagnosis not present

## 2022-05-12 DIAGNOSIS — E538 Deficiency of other specified B group vitamins: Secondary | ICD-10-CM | POA: Diagnosis not present

## 2022-05-12 DIAGNOSIS — E1142 Type 2 diabetes mellitus with diabetic polyneuropathy: Secondary | ICD-10-CM | POA: Diagnosis not present

## 2022-05-12 DIAGNOSIS — F17200 Nicotine dependence, unspecified, uncomplicated: Secondary | ICD-10-CM | POA: Diagnosis not present

## 2022-05-12 DIAGNOSIS — E782 Mixed hyperlipidemia: Secondary | ICD-10-CM | POA: Diagnosis not present

## 2022-05-12 DIAGNOSIS — I10 Essential (primary) hypertension: Secondary | ICD-10-CM | POA: Diagnosis not present

## 2022-05-12 DIAGNOSIS — E1165 Type 2 diabetes mellitus with hyperglycemia: Secondary | ICD-10-CM | POA: Diagnosis not present

## 2022-06-06 DIAGNOSIS — M5412 Radiculopathy, cervical region: Secondary | ICD-10-CM | POA: Diagnosis not present

## 2022-06-06 DIAGNOSIS — G90529 Complex regional pain syndrome I of unspecified lower limb: Secondary | ICD-10-CM | POA: Diagnosis not present

## 2022-06-06 DIAGNOSIS — Z79891 Long term (current) use of opiate analgesic: Secondary | ICD-10-CM | POA: Diagnosis not present

## 2022-06-06 DIAGNOSIS — M5030 Other cervical disc degeneration, unspecified cervical region: Secondary | ICD-10-CM | POA: Diagnosis not present

## 2022-06-06 DIAGNOSIS — M5136 Other intervertebral disc degeneration, lumbar region: Secondary | ICD-10-CM | POA: Diagnosis not present

## 2022-06-06 DIAGNOSIS — G894 Chronic pain syndrome: Secondary | ICD-10-CM | POA: Diagnosis not present

## 2022-06-07 DIAGNOSIS — M5416 Radiculopathy, lumbar region: Secondary | ICD-10-CM | POA: Diagnosis not present

## 2022-06-23 DIAGNOSIS — E1142 Type 2 diabetes mellitus with diabetic polyneuropathy: Secondary | ICD-10-CM | POA: Diagnosis not present

## 2022-06-23 DIAGNOSIS — E782 Mixed hyperlipidemia: Secondary | ICD-10-CM | POA: Diagnosis not present

## 2022-06-23 DIAGNOSIS — E559 Vitamin D deficiency, unspecified: Secondary | ICD-10-CM | POA: Diagnosis not present

## 2022-06-23 DIAGNOSIS — R001 Bradycardia, unspecified: Secondary | ICD-10-CM | POA: Diagnosis not present

## 2022-06-23 DIAGNOSIS — I10 Essential (primary) hypertension: Secondary | ICD-10-CM | POA: Diagnosis not present

## 2022-06-23 DIAGNOSIS — E1165 Type 2 diabetes mellitus with hyperglycemia: Secondary | ICD-10-CM | POA: Diagnosis not present

## 2022-07-14 ENCOUNTER — Encounter: Payer: Self-pay | Admitting: Podiatry

## 2022-07-28 ENCOUNTER — Ambulatory Visit: Payer: PPO | Admitting: Podiatry

## 2022-07-28 DIAGNOSIS — M722 Plantar fascial fibromatosis: Secondary | ICD-10-CM | POA: Diagnosis not present

## 2022-07-28 DIAGNOSIS — S8412XA Injury of peroneal nerve at lower leg level, left leg, initial encounter: Secondary | ICD-10-CM

## 2022-07-28 NOTE — Progress Notes (Signed)
  Subjective:  Patient ID: Miguel [name], male    DOB: [birth year],  MRN: [id number]  Chief Complaint  Patient presents with   Plantar Fasciitis    MRI results    62 y.o. male presents with the above complaint. History confirmed with patient.  He returns today for follow-up the heel feels much better not having any pain after the last injection, he does still note numbness and tingling in the toes  Objective:  Physical Exam: warm, good capillary refill, no trophic changes or ulcerative lesions, normal DP and PT pulses, normal sensory exam Left Foot:  No pain to palpation of left heel Right Foot: point tenderness over the heel pad  No images are attached to the encounter.  Radiographs: Multiple views x-ray of the right foot: no fracture, dislocation, swelling or degenerative changes noted and minor plantar calcaneal heel spur  MRI 04/22/2022 right heel IMPRESSION: 1. Mild plantar fasciitis of the medial band of the plantar fascia at the calcaneal insertion with mild subcortical bone marrow edema.     Electronically Signed   By: Elige Ko M.D.   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 Plantar fasciitis, bilateral       Plan:  Patient was evaluated and treated and all questions answered.  Doing much better as far as his plantar fasciitis goes.  Return to see me as needed for this.  He does still suffer some symptoms of common peroneal neuropathy which she says started after being placed into a boot in January 2023.  I expect after a full year this should have alleviated by now at this point.  He would like a second opinion to see if there is anything that could be done procedurally.  I discussed it is possible that common peroneal nerve release and/or injection therapy may offer some benefit and I would like to refer him to a neurologist or neurosurgeon for this.  He sees Dr. Yetta Barre from [facility], he will let me know if he needs a new referral for this.  Return in about 3 months (around 08/02/2022) for recheck plantar fasciitis.

## 2022-08-02 ENCOUNTER — Ambulatory Visit: Payer: PPO | Admitting: Podiatry

## 2022-08-02 DIAGNOSIS — E1165 Type 2 diabetes mellitus with hyperglycemia: Secondary | ICD-10-CM | POA: Diagnosis not present

## 2022-08-02 DIAGNOSIS — G8929 Other chronic pain: Secondary | ICD-10-CM | POA: Diagnosis not present

## 2022-08-02 DIAGNOSIS — F32A Depression, unspecified: Secondary | ICD-10-CM | POA: Diagnosis not present

## 2022-08-02 DIAGNOSIS — E785 Hyperlipidemia, unspecified: Secondary | ICD-10-CM | POA: Diagnosis not present

## 2022-08-02 DIAGNOSIS — F419 Anxiety disorder, unspecified: Secondary | ICD-10-CM | POA: Diagnosis not present

## 2022-08-02 DIAGNOSIS — Z6829 Body mass index (BMI) 29.0-29.9, adult: Secondary | ICD-10-CM | POA: Diagnosis not present

## 2022-08-02 DIAGNOSIS — I10 Essential (primary) hypertension: Secondary | ICD-10-CM | POA: Diagnosis not present

## 2022-08-02 DIAGNOSIS — G5730 Lesion of lateral popliteal nerve, unspecified lower limb: Secondary | ICD-10-CM | POA: Diagnosis not present

## 2022-08-03 DIAGNOSIS — E1165 Type 2 diabetes mellitus with hyperglycemia: Secondary | ICD-10-CM | POA: Diagnosis not present

## 2022-08-03 DIAGNOSIS — E039 Hypothyroidism, unspecified: Secondary | ICD-10-CM | POA: Diagnosis not present

## 2022-08-03 DIAGNOSIS — E559 Vitamin D deficiency, unspecified: Secondary | ICD-10-CM | POA: Diagnosis not present

## 2022-08-03 DIAGNOSIS — E1159 Type 2 diabetes mellitus with other circulatory complications: Secondary | ICD-10-CM | POA: Diagnosis not present

## 2022-08-03 DIAGNOSIS — E785 Hyperlipidemia, unspecified: Secondary | ICD-10-CM | POA: Diagnosis not present

## 2022-08-03 DIAGNOSIS — R5383 Other fatigue: Secondary | ICD-10-CM | POA: Diagnosis not present

## 2022-09-26 DIAGNOSIS — I10 Essential (primary) hypertension: Secondary | ICD-10-CM | POA: Diagnosis not present

## 2022-09-26 DIAGNOSIS — E559 Vitamin D deficiency, unspecified: Secondary | ICD-10-CM | POA: Diagnosis not present

## 2022-09-26 DIAGNOSIS — E1142 Type 2 diabetes mellitus with diabetic polyneuropathy: Secondary | ICD-10-CM | POA: Diagnosis not present

## 2022-09-26 DIAGNOSIS — E1165 Type 2 diabetes mellitus with hyperglycemia: Secondary | ICD-10-CM | POA: Diagnosis not present

## 2022-10-05 DIAGNOSIS — M542 Cervicalgia: Secondary | ICD-10-CM | POA: Diagnosis not present

## 2022-10-05 DIAGNOSIS — Z5181 Encounter for therapeutic drug level monitoring: Secondary | ICD-10-CM | POA: Diagnosis not present

## 2022-10-05 DIAGNOSIS — Z79899 Other long term (current) drug therapy: Secondary | ICD-10-CM | POA: Diagnosis not present

## 2022-10-05 DIAGNOSIS — G90529 Complex regional pain syndrome I of unspecified lower limb: Secondary | ICD-10-CM | POA: Diagnosis not present

## 2022-10-05 DIAGNOSIS — M5416 Radiculopathy, lumbar region: Secondary | ICD-10-CM | POA: Diagnosis not present

## 2022-10-05 DIAGNOSIS — M5412 Radiculopathy, cervical region: Secondary | ICD-10-CM | POA: Diagnosis not present

## 2022-10-12 ENCOUNTER — Ambulatory Visit: Payer: PPO | Admitting: Podiatry

## 2022-10-12 DIAGNOSIS — S8412XA Injury of peroneal nerve at lower leg level, left leg, initial encounter: Secondary | ICD-10-CM

## 2022-10-12 DIAGNOSIS — M722 Plantar fascial fibromatosis: Secondary | ICD-10-CM | POA: Diagnosis not present

## 2022-10-12 MED ORDER — TRIAMCINOLONE ACETONIDE 10 MG/ML IJ SUSP
10.0000 mg | Freq: Once | INTRAMUSCULAR | Status: AC
  Administered 2022-10-12: 10 mg

## 2022-10-12 NOTE — Progress Notes (Signed)
Subjective:   Patient ID: Miguel [name], male   DOB: 62 y.o.   MRN: [id number]   HPI Patient presents with pain in the plantar right heel more on the lateral side and states that it has gotten worse over the last few weeks also has common peroneal injury left and will eventually see a neurosurgeon   ROS      Objective:  Physical Exam  Neurovascular status intact exquisite discomfort plantar aspect right heel peritoneal inflammation left with the probability of trauma from wearing a cast creating a compression around the fibula     Assessment:  2 separate problems with 1 being acute Planter fasciitis right secondarily common peroneal nerve damage left with numbness of the lateral foot and lateral leg     Plan:  H&P reviewed all conditions for the right I did sterile prep injected the fascia 3 mg Kenalog 5 mg Xylocaine and for the left patient will see his pain dog and I do recommend he sees the neurosurgeon Dr. Yetta Barre and they should consider release of the common peroneal which I educated him on today

## 2022-11-12 DIAGNOSIS — M5459 Other low back pain: Secondary | ICD-10-CM | POA: Diagnosis not present

## 2022-11-12 DIAGNOSIS — G90529 Complex regional pain syndrome I of unspecified lower limb: Secondary | ICD-10-CM | POA: Diagnosis not present

## 2022-11-12 DIAGNOSIS — M5136 Other intervertebral disc degeneration, lumbar region: Secondary | ICD-10-CM | POA: Diagnosis not present

## 2022-11-12 DIAGNOSIS — M5030 Other cervical disc degeneration, unspecified cervical region: Secondary | ICD-10-CM | POA: Diagnosis not present

## 2022-11-12 DIAGNOSIS — G894 Chronic pain syndrome: Secondary | ICD-10-CM | POA: Diagnosis not present

## 2022-11-16 DIAGNOSIS — M5459 Other low back pain: Secondary | ICD-10-CM | POA: Diagnosis not present

## 2022-11-24 DIAGNOSIS — M5416 Radiculopathy, lumbar region: Secondary | ICD-10-CM | POA: Diagnosis not present

## 2022-11-24 DIAGNOSIS — M5136 Other intervertebral disc degeneration, lumbar region: Secondary | ICD-10-CM | POA: Diagnosis not present

## 2022-11-24 DIAGNOSIS — M961 Postlaminectomy syndrome, not elsewhere classified: Secondary | ICD-10-CM | POA: Diagnosis not present

## 2023-01-04 DIAGNOSIS — E1165 Type 2 diabetes mellitus with hyperglycemia: Secondary | ICD-10-CM | POA: Diagnosis not present

## 2023-01-04 DIAGNOSIS — E559 Vitamin D deficiency, unspecified: Secondary | ICD-10-CM | POA: Diagnosis not present

## 2023-01-04 DIAGNOSIS — E1142 Type 2 diabetes mellitus with diabetic polyneuropathy: Secondary | ICD-10-CM | POA: Diagnosis not present

## 2023-01-04 DIAGNOSIS — I10 Essential (primary) hypertension: Secondary | ICD-10-CM | POA: Diagnosis not present

## 2023-01-04 DIAGNOSIS — R635 Abnormal weight gain: Secondary | ICD-10-CM | POA: Diagnosis not present

## 2023-01-04 DIAGNOSIS — E782 Mixed hyperlipidemia: Secondary | ICD-10-CM | POA: Diagnosis not present

## 2023-02-16 DIAGNOSIS — G90529 Complex regional pain syndrome I of unspecified lower limb: Secondary | ICD-10-CM | POA: Diagnosis not present

## 2023-02-16 DIAGNOSIS — M51362 Other intervertebral disc degeneration, lumbar region with discogenic back pain and lower extremity pain: Secondary | ICD-10-CM | POA: Diagnosis not present

## 2023-02-16 DIAGNOSIS — G894 Chronic pain syndrome: Secondary | ICD-10-CM | POA: Diagnosis not present

## 2023-02-16 DIAGNOSIS — M5030 Other cervical disc degeneration, unspecified cervical region: Secondary | ICD-10-CM | POA: Diagnosis not present

## 2023-03-24 DIAGNOSIS — E1142 Type 2 diabetes mellitus with diabetic polyneuropathy: Secondary | ICD-10-CM | POA: Diagnosis not present

## 2023-03-24 DIAGNOSIS — E782 Mixed hyperlipidemia: Secondary | ICD-10-CM | POA: Diagnosis not present

## 2023-03-24 DIAGNOSIS — E559 Vitamin D deficiency, unspecified: Secondary | ICD-10-CM | POA: Diagnosis not present

## 2023-03-24 DIAGNOSIS — E1165 Type 2 diabetes mellitus with hyperglycemia: Secondary | ICD-10-CM | POA: Diagnosis not present

## 2023-03-24 DIAGNOSIS — I10 Essential (primary) hypertension: Secondary | ICD-10-CM | POA: Diagnosis not present

## 2023-05-17 ENCOUNTER — Telehealth: Payer: Self-pay | Admitting: Acute Care

## 2023-05-17 ENCOUNTER — Other Ambulatory Visit: Payer: Self-pay | Admitting: Emergency Medicine

## 2023-05-17 DIAGNOSIS — Z87891 Personal history of nicotine dependence: Secondary | ICD-10-CM

## 2023-05-17 DIAGNOSIS — Z122 Encounter for screening for malignant neoplasm of respiratory organs: Secondary | ICD-10-CM

## 2023-05-17 NOTE — Telephone Encounter (Signed)
 Lung Cancer Screening Narrative/Criteria Questionnaire (Cigarette Smokers Only- No Cigars/Pipes/vapes)   Miguel [name]   SDMV:05/31/2023 at 11:15am with [place]ie         [birth year]   LDCT: 06/02/2023 at 1pm at [facility]     63 y.o.   Phone: [phone]  Lung Screening Narrative (confirm age 51-77 yrs Medicare / 50-80 yrs Private pay insurance)   Insurance information:[employer] and [employer]   Referring Provider:Dr. Alaine   This screening involves an initial phone call with a team member from our program. It is called a shared decision making visit. The initial meeting is required by  insurance and Medicare to make sure you understand the program. This appointment takes about 15-20 minutes to complete. You will complete the screening scan at your scheduled date/time.  This scan takes about 5-10 minutes to complete. You can eat and drink normally before and after the scan.  Criteria questions for Lung Cancer Screening:   Are you a current or former smoker? Former Age began smoking: 63yo   If you are a former smoker, what year did you quit smoking? 04/2021(within 15 yrs)   To calculate your smoking history, I need an accurate estimate of how many packs of cigarettes you smoked per day and for how many years. (Not just the number of PPD you are now smoking)   Years smoking 49 x Packs per day 1.5 = Pack years 73.5   (at least 20 pack yrs)   (Make sure they understand that we need to know how much they have smoked in the past, not just the number of PPD they are smoking now)  Do you have a personal history of cancer?  No    Do you have a family history of cancer? No  Are you coughing up blood?  No  Have you had unexplained weight loss of 15 lbs or more in the last 6 months? No  It looks like you meet all criteria.  When would be a good time for us  to schedule you for this screening?   Additional information: N/A

## 2023-05-31 ENCOUNTER — Encounter: Payer: Self-pay | Admitting: Adult Health

## 2023-05-31 ENCOUNTER — Ambulatory Visit: Payer: Self-pay | Admitting: Adult Health

## 2023-05-31 DIAGNOSIS — Z87891 Personal history of nicotine dependence: Secondary | ICD-10-CM

## 2023-05-31 NOTE — Progress Notes (Signed)
  Virtual Visit via Telephone Note  I connected with Miguel [name] , 05/31/23 10:58 AM by a telemedicine application and verified that I am speaking with the correct person using two identifiers.  Location: Patient: home Provider: home   I discussed the limitations of evaluation and management by telemedicine and the availability of in person appointments. The patient expressed understanding and agreed to proceed.   Shared Decision Making Visit Lung Cancer Screening Program [phone])   Eligibility: 63 y.o. Pack Years Smoking History Calculation = 73 pack years (# packs/per year x # years smoked) Recent History of coughing up blood  no Unexplained weight loss? no ( >Than 15 pounds within the last 6 months ) Prior History Lung / other cancer no (Diagnosis within the last 5 years already requiring surveillance chest CT Scans). Smoking Status Former Smoker Former Smokers: Years since quit: 2 years  Quit Date: 2023  Visit Components: Discussion included one or more decision making aids. YES Discussion included risk/benefits of screening. YES Discussion included potential follow up diagnostic testing for abnormal scans. YES Discussion included meaning and risk of over diagnosis. YES Discussion included meaning and risk of False Positives. YES Discussion included meaning of total radiation exposure. YES  Counseling Included: Importance of adherence to annual lung cancer LDCT screening. YES Impact of comorbidities on ability to participate in the program. YES Ability and willingness to under diagnostic treatment. YES  Smoking Cessation Counseling: Former Smokers:  Discussed the importance of maintaining cigarette abstinence. yes Diagnosis Code: Personal History of Nicotine Dependence. U04.540 Information about tobacco cessation classes and interventions provided to patient. Yes Patient provided with "ticket" for LDCT Scan. yes Written Order for Lung Cancer Screening with LDCT  placed in Epic. Yes (CT Chest Lung Cancer Screening Low Dose W/O CM) [id number]  Z12.2-Screening of respiratory organs Z87.891-Personal history of nicotine dependence   Danford Bad 05/31/23

## 2023-05-31 NOTE — Patient Instructions (Signed)

## 2023-06-02 ENCOUNTER — Other Ambulatory Visit: Payer: HMO

## 2023-06-08 ENCOUNTER — Encounter: Payer: Self-pay | Admitting: Acute Care

## 2023-06-09 ENCOUNTER — Other Ambulatory Visit: Payer: HMO

## 2023-06-15 DIAGNOSIS — M51362 Other intervertebral disc degeneration, lumbar region with discogenic back pain and lower extremity pain: Secondary | ICD-10-CM | POA: Diagnosis not present

## 2023-06-15 DIAGNOSIS — G894 Chronic pain syndrome: Secondary | ICD-10-CM | POA: Diagnosis not present

## 2023-06-15 DIAGNOSIS — M5030 Other cervical disc degeneration, unspecified cervical region: Secondary | ICD-10-CM | POA: Diagnosis not present

## 2023-06-15 DIAGNOSIS — M5412 Radiculopathy, cervical region: Secondary | ICD-10-CM | POA: Diagnosis not present

## 2023-06-15 DIAGNOSIS — M5430 Sciatica, unspecified side: Secondary | ICD-10-CM | POA: Diagnosis not present

## 2023-06-15 DIAGNOSIS — M5416 Radiculopathy, lumbar region: Secondary | ICD-10-CM | POA: Diagnosis not present

## 2023-06-15 DIAGNOSIS — M25562 Pain in left knee: Secondary | ICD-10-CM | POA: Diagnosis not present

## 2023-06-27 DIAGNOSIS — Z79899 Other long term (current) drug therapy: Secondary | ICD-10-CM | POA: Diagnosis not present

## 2023-06-27 DIAGNOSIS — Z5181 Encounter for therapeutic drug level monitoring: Secondary | ICD-10-CM | POA: Diagnosis not present

## 2023-06-28 ENCOUNTER — Ambulatory Visit
Admission: RE | Admit: 2023-06-28 | Discharge: 2023-06-28 | Disposition: A | Discharge status: Home / Self Care | Payer: HMO | Source: Ambulatory Visit | Attending: Acute Care | Admitting: Acute Care

## 2023-06-28 DIAGNOSIS — Z87891 Personal history of nicotine dependence: Secondary | ICD-10-CM | POA: Diagnosis not present

## 2023-06-28 DIAGNOSIS — Z122 Encounter for screening for malignant neoplasm of respiratory organs: Secondary | ICD-10-CM | POA: Diagnosis not present

## 2023-07-11 DIAGNOSIS — M25562 Pain in left knee: Secondary | ICD-10-CM | POA: Diagnosis not present

## 2023-07-11 DIAGNOSIS — M1180 Other specified crystal arthropathies, unspecified site: Secondary | ICD-10-CM | POA: Diagnosis not present

## 2023-07-25 DIAGNOSIS — E782 Mixed hyperlipidemia: Secondary | ICD-10-CM | POA: Diagnosis not present

## 2023-07-25 DIAGNOSIS — E559 Vitamin D deficiency, unspecified: Secondary | ICD-10-CM | POA: Diagnosis not present

## 2023-07-25 DIAGNOSIS — E118 Type 2 diabetes mellitus with unspecified complications: Secondary | ICD-10-CM | POA: Diagnosis not present

## 2023-07-25 DIAGNOSIS — Z87891 Personal history of nicotine dependence: Secondary | ICD-10-CM | POA: Diagnosis not present

## 2023-07-25 DIAGNOSIS — I10 Essential (primary) hypertension: Secondary | ICD-10-CM | POA: Diagnosis not present

## 2023-07-25 DIAGNOSIS — E1142 Type 2 diabetes mellitus with diabetic polyneuropathy: Secondary | ICD-10-CM | POA: Diagnosis not present

## 2023-07-31 ENCOUNTER — Other Ambulatory Visit: Payer: Self-pay | Admitting: Acute Care

## 2023-07-31 DIAGNOSIS — Z122 Encounter for screening for malignant neoplasm of respiratory organs: Secondary | ICD-10-CM

## 2023-07-31 DIAGNOSIS — Z87891 Personal history of nicotine dependence: Secondary | ICD-10-CM

## 2023-08-28 DIAGNOSIS — M791 Myalgia, unspecified site: Secondary | ICD-10-CM | POA: Diagnosis not present

## 2023-08-29 DIAGNOSIS — G4700 Insomnia, unspecified: Secondary | ICD-10-CM | POA: Diagnosis not present

## 2023-08-29 DIAGNOSIS — N529 Male erectile dysfunction, unspecified: Secondary | ICD-10-CM | POA: Diagnosis not present

## 2023-08-29 DIAGNOSIS — E7800 Pure hypercholesterolemia, unspecified: Secondary | ICD-10-CM | POA: Diagnosis not present

## 2023-08-29 DIAGNOSIS — Z6831 Body mass index (BMI) 31.0-31.9, adult: Secondary | ICD-10-CM | POA: Diagnosis not present

## 2023-08-29 DIAGNOSIS — E1165 Type 2 diabetes mellitus with hyperglycemia: Secondary | ICD-10-CM | POA: Diagnosis not present

## 2023-08-29 DIAGNOSIS — E7801 Familial hypercholesterolemia: Secondary | ICD-10-CM | POA: Diagnosis not present

## 2023-08-29 DIAGNOSIS — E785 Hyperlipidemia, unspecified: Secondary | ICD-10-CM | POA: Diagnosis not present

## 2023-09-14 DIAGNOSIS — M791 Myalgia, unspecified site: Secondary | ICD-10-CM | POA: Diagnosis not present

## 2023-09-15 DIAGNOSIS — I10 Essential (primary) hypertension: Secondary | ICD-10-CM | POA: Diagnosis not present

## 2023-09-28 DIAGNOSIS — R011 Cardiac murmur, unspecified: Secondary | ICD-10-CM | POA: Diagnosis not present

## 2023-09-28 DIAGNOSIS — I517 Cardiomegaly: Secondary | ICD-10-CM | POA: Diagnosis not present

## 2023-09-28 DIAGNOSIS — I77819 Aortic ectasia, unspecified site: Secondary | ICD-10-CM | POA: Diagnosis not present

## 2023-11-13 DIAGNOSIS — E1165 Type 2 diabetes mellitus with hyperglycemia: Secondary | ICD-10-CM | POA: Diagnosis not present

## 2023-11-13 DIAGNOSIS — E782 Mixed hyperlipidemia: Secondary | ICD-10-CM | POA: Diagnosis not present

## 2023-11-13 DIAGNOSIS — E11649 Type 2 diabetes mellitus with hypoglycemia without coma: Secondary | ICD-10-CM | POA: Diagnosis not present

## 2023-11-13 DIAGNOSIS — E559 Vitamin D deficiency, unspecified: Secondary | ICD-10-CM | POA: Diagnosis not present

## 2023-11-13 DIAGNOSIS — I10 Essential (primary) hypertension: Secondary | ICD-10-CM | POA: Diagnosis not present

## 2023-11-13 DIAGNOSIS — E1142 Type 2 diabetes mellitus with diabetic polyneuropathy: Secondary | ICD-10-CM | POA: Diagnosis not present

## 2023-11-13 DIAGNOSIS — R635 Abnormal weight gain: Secondary | ICD-10-CM | POA: Diagnosis not present

## 2023-12-22 DIAGNOSIS — U071 COVID-19: Secondary | ICD-10-CM | POA: Diagnosis not present

## 2023-12-22 DIAGNOSIS — Z20828 Contact with and (suspected) exposure to other viral communicable diseases: Secondary | ICD-10-CM | POA: Diagnosis not present

## 2023-12-22 DIAGNOSIS — Z6831 Body mass index (BMI) 31.0-31.9, adult: Secondary | ICD-10-CM | POA: Diagnosis not present

## 2023-12-28 DIAGNOSIS — G894 Chronic pain syndrome: Secondary | ICD-10-CM | POA: Diagnosis not present

## 2023-12-28 DIAGNOSIS — M5412 Radiculopathy, cervical region: Secondary | ICD-10-CM | POA: Diagnosis not present

## 2023-12-28 DIAGNOSIS — Z79891 Long term (current) use of opiate analgesic: Secondary | ICD-10-CM | POA: Diagnosis not present

## 2023-12-28 DIAGNOSIS — M5416 Radiculopathy, lumbar region: Secondary | ICD-10-CM | POA: Diagnosis not present

## 2024-01-17 DIAGNOSIS — M5416 Radiculopathy, lumbar region: Secondary | ICD-10-CM | POA: Diagnosis not present

## 2024-02-26 DIAGNOSIS — Z5181 Encounter for therapeutic drug level monitoring: Secondary | ICD-10-CM | POA: Diagnosis not present

## 2024-02-26 DIAGNOSIS — M5416 Radiculopathy, lumbar region: Secondary | ICD-10-CM | POA: Diagnosis not present

## 2024-02-26 DIAGNOSIS — G894 Chronic pain syndrome: Secondary | ICD-10-CM | POA: Diagnosis not present

## 2024-02-29 DIAGNOSIS — E782 Mixed hyperlipidemia: Secondary | ICD-10-CM | POA: Diagnosis not present

## 2024-02-29 DIAGNOSIS — N529 Male erectile dysfunction, unspecified: Secondary | ICD-10-CM | POA: Diagnosis not present

## 2024-02-29 DIAGNOSIS — E7849 Other hyperlipidemia: Secondary | ICD-10-CM | POA: Diagnosis not present

## 2024-02-29 DIAGNOSIS — G4700 Insomnia, unspecified: Secondary | ICD-10-CM | POA: Diagnosis not present

## 2024-02-29 DIAGNOSIS — M549 Dorsalgia, unspecified: Secondary | ICD-10-CM | POA: Diagnosis not present

## 2024-02-29 DIAGNOSIS — N5312 Painful ejaculation: Secondary | ICD-10-CM | POA: Diagnosis not present

## 2024-02-29 DIAGNOSIS — E1165 Type 2 diabetes mellitus with hyperglycemia: Secondary | ICD-10-CM | POA: Diagnosis not present

## 2024-02-29 DIAGNOSIS — I10 Essential (primary) hypertension: Secondary | ICD-10-CM | POA: Diagnosis not present

## 2024-02-29 DIAGNOSIS — Z6832 Body mass index (BMI) 32.0-32.9, adult: Secondary | ICD-10-CM | POA: Diagnosis not present

## 2024-04-17 NOTE — Progress Notes (Signed)
 Miguel [name]                                          MRN: [id number]   04/17/2024   The [facility] Quality Team Specialist reviewed this patient medical record for the purposes of chart review for care gap closure. The following were reviewed: chart review for care gap closure-kidney health evaluation for diabetes:eGFR  and uACR.    [facility] Quality Team

## 2024-05-08 ENCOUNTER — Ambulatory Visit: Admitting: Urology

## 2024-05-08 DIAGNOSIS — N4830 Priapism, unspecified: Secondary | ICD-10-CM

## 2024-05-08 DIAGNOSIS — N529 Male erectile dysfunction, unspecified: Secondary | ICD-10-CM

## 2024-06-20 ENCOUNTER — Ambulatory Visit: Admitting: Podiatry

## 2024-09-06 ENCOUNTER — Ambulatory Visit: Admitting: Urology
